# Patient Record
Sex: Female | Born: 1945 | Race: White | Hispanic: No | Marital: Married | Smoking: Never smoker
Health system: Southern US, Community
[De-identification: ages and names within clinical notes are randomized; demographics above are authoritative.]

## PROBLEM LIST (undated history)

## (undated) DIAGNOSIS — E079 Disorder of thyroid, unspecified: Secondary | ICD-10-CM

## (undated) DIAGNOSIS — M1990 Unspecified osteoarthritis, unspecified site: Secondary | ICD-10-CM

## (undated) DIAGNOSIS — K219 Gastro-esophageal reflux disease without esophagitis: Secondary | ICD-10-CM

## (undated) DIAGNOSIS — J45909 Unspecified asthma, uncomplicated: Secondary | ICD-10-CM

## (undated) DIAGNOSIS — R609 Edema, unspecified: Secondary | ICD-10-CM

## (undated) DIAGNOSIS — Z8719 Personal history of other diseases of the digestive system: Secondary | ICD-10-CM

## (undated) DIAGNOSIS — G629 Polyneuropathy, unspecified: Secondary | ICD-10-CM

## (undated) DIAGNOSIS — E039 Hypothyroidism, unspecified: Secondary | ICD-10-CM

## (undated) DIAGNOSIS — F40240 Claustrophobia: Secondary | ICD-10-CM

## (undated) DIAGNOSIS — T7840XA Allergy, unspecified, initial encounter: Secondary | ICD-10-CM

## (undated) DIAGNOSIS — B009 Herpesviral infection, unspecified: Secondary | ICD-10-CM

## (undated) HISTORY — DX: Unspecified osteoarthritis, unspecified site: M19.90

## (undated) HISTORY — DX: Allergy, unspecified, initial encounter: T78.40XA

## (undated) HISTORY — PX: BACK SURGERY: SHX140

## (undated) HISTORY — DX: Gastro-esophageal reflux disease without esophagitis: K21.9

## (undated) HISTORY — DX: Herpesviral infection, unspecified: B00.9

## (undated) HISTORY — PX: CHOLECYSTECTOMY: SHX55

## (undated) HISTORY — DX: Disorder of thyroid, unspecified: E07.9

## (undated) HISTORY — DX: Unspecified asthma, uncomplicated: J45.909

## (undated) HISTORY — DX: Polyneuropathy, unspecified: G62.9

---

## 1970-03-17 HISTORY — PX: BREAST BIOPSY: SHX20

## 1979-03-18 HISTORY — PX: LUMBAR LAMINECTOMY: SHX95

## 1997-03-17 HISTORY — PX: LUMBAR LAMINECTOMY: SHX95

## 1998-02-27 ENCOUNTER — Other Ambulatory Visit: Admission: RE | Admit: 1998-02-27 | Discharge: 1998-02-27 | Payer: Self-pay | Admitting: Obstetrics and Gynecology

## 2006-01-06 ENCOUNTER — Ambulatory Visit: Payer: Self-pay | Admitting: Internal Medicine

## 2006-01-27 ENCOUNTER — Ambulatory Visit: Payer: Self-pay | Admitting: Internal Medicine

## 2006-10-06 ENCOUNTER — Encounter: Admission: RE | Admit: 2006-10-06 | Discharge: 2006-10-06 | Payer: Self-pay | Admitting: Internal Medicine

## 2006-10-11 ENCOUNTER — Encounter: Admission: RE | Admit: 2006-10-11 | Discharge: 2006-10-11 | Payer: Self-pay | Admitting: Internal Medicine

## 2008-02-14 ENCOUNTER — Ambulatory Visit: Payer: Self-pay | Admitting: Internal Medicine

## 2008-07-11 ENCOUNTER — Ambulatory Visit: Payer: Self-pay | Admitting: Internal Medicine

## 2008-08-07 ENCOUNTER — Ambulatory Visit: Payer: Self-pay | Admitting: Internal Medicine

## 2008-09-03 IMAGING — CR DG PELVIS 1-2V
1 series · 1 of 1 positions shown · non-contrast
Comparison: none

CLINICAL DATA: Right hip and groin pain

Pelvis one view:
There is no previous for comparison. Degenerative changes in the visualized
lower lumbar spine. Mild narrowing of the articular cartilage in the hips, right
greater than left. Negative for fracture, dislocation, or other acute bone
abnormality.

[view not recorded]
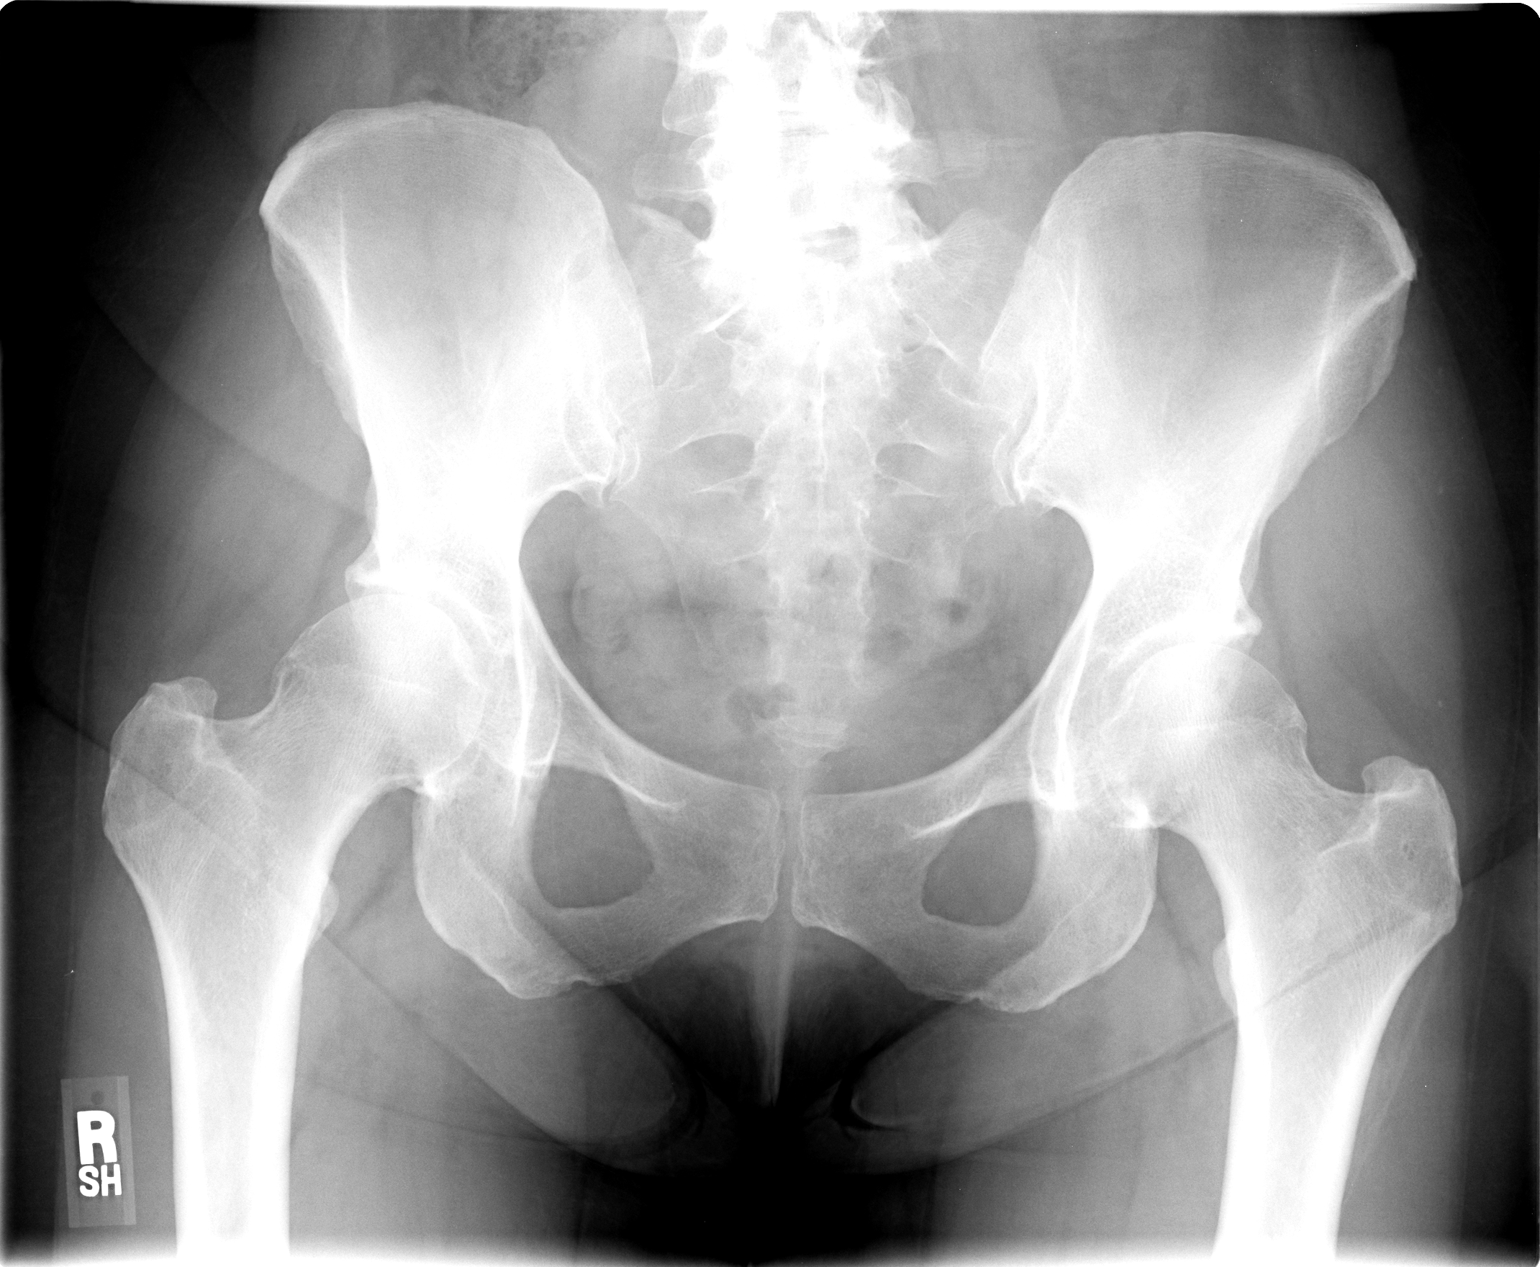

[1 of 1 positions shown; findings below may reference images not displayed]

IMPRESSION: 1. Early degenerative changes in bilateral hips and lumbar spine.

Right hip 2 view:

Small marginal spurs from the femoral head. Negative for fracture, dislocation,
or other acute bone injury. Lumbar facet degenerative changes again noted.
IMPRESSION: 1. Negative for fracture, dislocation, or other acute bone injury.
2. Mild degenerative changes in the right hip

## 2009-02-22 ENCOUNTER — Ambulatory Visit: Payer: Self-pay | Admitting: Internal Medicine

## 2009-04-09 ENCOUNTER — Ambulatory Visit: Payer: Self-pay | Admitting: Internal Medicine

## 2009-04-10 ENCOUNTER — Encounter: Admission: RE | Admit: 2009-04-10 | Discharge: 2009-04-10 | Payer: Self-pay | Admitting: Neurosurgery

## 2009-11-30 ENCOUNTER — Ambulatory Visit: Payer: Self-pay | Admitting: Internal Medicine

## 2010-05-21 ENCOUNTER — Other Ambulatory Visit: Payer: Self-pay | Admitting: Internal Medicine

## 2010-05-23 ENCOUNTER — Encounter: Payer: Self-pay | Admitting: Internal Medicine

## 2010-06-18 ENCOUNTER — Encounter (INDEPENDENT_AMBULATORY_CARE_PROVIDER_SITE_OTHER): Payer: PRIVATE HEALTH INSURANCE | Admitting: Internal Medicine

## 2010-06-18 DIAGNOSIS — Z23 Encounter for immunization: Secondary | ICD-10-CM

## 2010-06-18 DIAGNOSIS — E039 Hypothyroidism, unspecified: Secondary | ICD-10-CM

## 2010-08-15 ENCOUNTER — Telehealth: Payer: Self-pay | Admitting: Internal Medicine

## 2010-08-15 NOTE — Telephone Encounter (Signed)
Please doorder for [name] to have bone density study at same time as mammogram

## 2010-08-29 ENCOUNTER — Encounter: Payer: Self-pay | Admitting: Internal Medicine

## 2010-10-18 ENCOUNTER — Other Ambulatory Visit: Payer: Self-pay

## 2010-10-18 MED ORDER — LEVOTHYROXINE SODIUM 125 MCG PO TABS: 125.0000 ug | ORAL_TABLET | Freq: Every day | ORAL | Status: DC

## 2010-11-01 ENCOUNTER — Encounter: Payer: Self-pay | Admitting: Internal Medicine

## 2010-12-18 ENCOUNTER — Encounter: Payer: Self-pay | Admitting: Internal Medicine

## 2010-12-19 ENCOUNTER — Ambulatory Visit: Payer: PRIVATE HEALTH INSURANCE | Admitting: Internal Medicine

## 2011-01-02 ENCOUNTER — Ambulatory Visit: Payer: PRIVATE HEALTH INSURANCE | Admitting: Internal Medicine

## 2011-01-07 ENCOUNTER — Ambulatory Visit (INDEPENDENT_AMBULATORY_CARE_PROVIDER_SITE_OTHER): Payer: PRIVATE HEALTH INSURANCE | Admitting: Internal Medicine

## 2011-01-07 ENCOUNTER — Encounter: Payer: Self-pay | Admitting: Internal Medicine

## 2011-01-07 VITALS — BP 128/74 | HR 68 | Temp 98.3°F | Ht 64.5 in | Wt 153.5 lb

## 2011-01-07 DIAGNOSIS — F411 Generalized anxiety disorder: Secondary | ICD-10-CM

## 2011-01-07 DIAGNOSIS — J309 Allergic rhinitis, unspecified: Secondary | ICD-10-CM | POA: Insufficient documentation

## 2011-01-07 DIAGNOSIS — F419 Anxiety disorder, unspecified: Secondary | ICD-10-CM | POA: Insufficient documentation

## 2011-01-07 DIAGNOSIS — B009 Herpesviral infection, unspecified: Secondary | ICD-10-CM

## 2011-01-07 DIAGNOSIS — E039 Hypothyroidism, unspecified: Secondary | ICD-10-CM

## 2011-01-07 NOTE — Patient Instructions (Signed)
Thyroid-stimulating hormone drawn today and results are pending. For now continue same dose of Synthroid. Please return in 6 months for physical examination and fasting lab work. You may defer bone density study until next year. Please gets annual mammogram.

## 2011-01-07 NOTE — Progress Notes (Signed)
  Subjective:    Patient ID: [name], female    DOB: 1945/04/02, 65 y.o.   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 Patient continues to have issues with right hip osteoarthritis. Had bone density June 2010 which was normal. Last mammogram [facility] August 2011. Is on Synthroid 0.125 mg daily. Takes Osteo Bi-Flex for osteoarthritis of hip. Takes Claritin. Takes Valtrex 1000 mg daily to prevent outbreak of HSV type II.  Past medical history includes benign left breast biopsy 1972, lumbar disc surgery L4 1999, lumbar disc surgery at L5 1981, cholecystectomy Aug 16, 2002. Had Pneumovax immunization April 2012. Just recently had influenza immunization through her husband's employment. Had tetanus immunization Aug 16, 2003. She is allergic to penicillin. Gets GYN care through [facility]. Had colonoscopy by Dr. Juanda Chance November 2007 with 10 year followup recommended. Bone density study June 2010 normal. Spirometry done by allergist 2001/08/15 normal. Allergy skin test positive for molds in Aug 15, 2001.  Family history father died at age 67 of an MI. One brother died at 58 years of age of congestive heart failure from rheumatic fever. 2 sisters with hypertension. One brother in good health. 2 adult daughters in good health. Mother in her 48s with heart and neurological problems.  Patient does not smoke. Social alcohol consumption.    Review of Systems     Objective:   Physical Exam neck is supple without adenopathy or thyromegaly. Chest is clear to auscultation. Cardiac exam regular rate and rhythm normal S1 and S2. Extremities without edema.        Assessment & Plan:  Hypothyroidism-TSH drawn today and is pending  Allergic rhinitis-stable  HSV type II-stable on Valtrex prevention therapy per GYN  Plan: Return in 6 months for physical exam without GYN exam  and fasting lab work

## 2011-04-13 ENCOUNTER — Ambulatory Visit (INDEPENDENT_AMBULATORY_CARE_PROVIDER_SITE_OTHER): Payer: PRIVATE HEALTH INSURANCE | Admitting: Internal Medicine

## 2011-04-13 VITALS — HR 80 | Temp 97.3°F | Resp 12

## 2011-04-13 DIAGNOSIS — J029 Acute pharyngitis, unspecified: Secondary | ICD-10-CM

## 2011-04-14 ENCOUNTER — Encounter: Payer: Self-pay | Admitting: Internal Medicine

## 2011-04-14 LAB — POCT RAPID STREP A (OFFICE): Rapid Strep A Screen: NEGATIVE

## 2011-04-14 NOTE — Patient Instructions (Signed)
Take Zithromax Z-Pak 2 tablets today followed by 1 tablet days 2 through 5. Take Hycodan 1 teaspoon every 6 hours as needed for cough

## 2011-04-14 NOTE — Progress Notes (Signed)
  Subjective:    Patient ID: [name], female    DOB: 12-Nov-1945, 66 y.o.   MRN: [id number]  HPI  husband called this morning saying patient had been ill for a couple of days. Has congested cough, fever, headache and flulike illness. Complaining of right ear pain. Suggested he bring her to the office for an emergency after hours visit. No shaking chills or diffuse myalgias.    Review of Systems     Objective:   Physical Exam right TM is full but not red, left TM clear, pharynx slightly injected without exudate. Rapid strep screen is negative. Neck supple. Chest clear.        Assessment & Plan:  Right serous otitis media  URI  Plan: Zithromax Z-Pak take 2 tablets by mouth today followed by 1 tablet by mouth days 2 through 5 with 1 refill. Hycodan 8 ounces 1 teaspoon by mouth every 6 hours when necessary cough.

## 2011-07-18 ENCOUNTER — Other Ambulatory Visit: Payer: PRIVATE HEALTH INSURANCE | Admitting: Internal Medicine

## 2011-07-18 DIAGNOSIS — Z0000 Encounter for general adult medical examination without abnormal findings: Secondary | ICD-10-CM

## 2011-07-18 DIAGNOSIS — E039 Hypothyroidism, unspecified: Secondary | ICD-10-CM

## 2011-07-18 LAB — CBC WITH DIFFERENTIAL/PLATELET
Basophils Absolute: 0.1 10*3/uL (ref 0.0–0.1)
Basophils Relative: 1 % (ref 0–1)
Hemoglobin: 14.1 g/dL (ref 12.0–15.0)
Lymphs Abs: 1.3 10*3/uL (ref 0.7–4.0)
MCV: 93.9 fL (ref 78.0–100.0)
Monocytes Absolute: 0.4 10*3/uL (ref 0.1–1.0)
Monocytes Relative: 8 % (ref 3–12)
Neutro Abs: 2.6 10*3/uL (ref 1.7–7.7)
Platelets: 246 10*3/uL (ref 150–400)
RBC: 4.62 MIL/uL (ref 3.87–5.11)

## 2011-07-18 LAB — LIPID PANEL
HDL: 66 mg/dL (ref >39)
Total CHOL/HDL Ratio: 2.8 Ratio
VLDL: 11 mg/dL (ref 0–40)

## 2011-07-18 LAB — COMPREHENSIVE METABOLIC PANEL
AST: 17 U/L (ref 0–37)
Albumin: 4.2 g/dL (ref 3.5–5.2)
Alkaline Phosphatase: 65 U/L (ref 39–117)
BUN: 17 mg/dL (ref 6–23)
CO2: 29 mEq/L (ref 19–32)
Chloride: 106 mEq/L (ref 96–112)
Creat: 0.65 mg/dL (ref 0.50–1.10)
Glucose, Bld: 75 mg/dL (ref 70–99)

## 2011-07-21 ENCOUNTER — Encounter: Payer: Self-pay | Admitting: Internal Medicine

## 2011-07-21 ENCOUNTER — Ambulatory Visit (INDEPENDENT_AMBULATORY_CARE_PROVIDER_SITE_OTHER): Payer: PRIVATE HEALTH INSURANCE | Admitting: Internal Medicine

## 2011-07-21 VITALS — BP 112/72 | HR 68 | Temp 98.0°F | Ht 63.25 in | Wt 152.0 lb

## 2011-07-21 DIAGNOSIS — M1990 Unspecified osteoarthritis, unspecified site: Secondary | ICD-10-CM

## 2011-07-21 DIAGNOSIS — Z0000 Encounter for general adult medical examination without abnormal findings: Secondary | ICD-10-CM

## 2011-07-21 LAB — POCT URINALYSIS DIPSTICK
Ketones, UA: NEGATIVE
Leukocytes, UA: NEGATIVE
Protein, UA: NEGATIVE
Spec Grav, UA: 1.02
Urobilinogen, UA: NEGATIVE
pH, UA: 5

## 2011-09-15 ENCOUNTER — Encounter: Payer: Self-pay | Admitting: Internal Medicine

## 2011-09-15 NOTE — Progress Notes (Signed)
Subjective:    Patient ID: [name], female    DOB: October 11, 1945, 66 y.o.   MRN: [id number]  HPI 66 year old white female in today for health maintenance examination and evaluation of medical problems. History of anxiety and hypothyroidism. History of allergic rhinitis. History of herpes simplex type II. History of right foot peripheral neuropathy. History of GE reflux. History of right hip osteoarthritis. Gets annual mammogram. She is allergic to penicillin it causes anaphylaxis.  Past medical history: Left breast biopsy which was benign 1972. Lumbar disc surgery at L4 1999, lumbar disc surgery at L5 1981. Cholecystectomy 2004. Had Pneumovax immunization April 2012. Tetanus immunization 2005. Dr. Lina Sar did colonoscopy November 2007 and recommended Q. 10 year followup. GYN exam is done by [facility] in [facility]  She has a health care power of attorney which I have a copy of in my chart.  Family history: Father died at age 39 of an MI mother with history of neurological problems and heart disease. One brother died at age 8 of congestive heart failure secondary to rheumatic fever complications. One brother in good health. One sister with hypertension and obesity. Another sister with history of hypertension and obesity. 2 adult daughters. Patient does not smoke. Social alcohol consumption. 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 She is married to [employer] who is Warehouse manager of [employer] in [place].  Subjective:   Patient presents for Medicare Annual/Subsequent preventive examination.   Review Past Medical/Family/Social: See above   Risk Factors  Current exercise habits: Walks frequently Dietary issues discussed: Low-fat diet   Cardiac risk factors:  Depression Screen  (Note: if answer to either of the following is "Yes", a more complete depression screening is indicated)   Over the past two weeks, have you felt down, depressed or hopeless? No    Over the past two weeks, have you felt little interest or pleasure in doing things? No Have you lost interest or pleasure in daily life? No Do you often feel hopeless? No Do you cry easily over simple problems? No   Activities of Daily Living  In your present state of health, do you have any difficulty performing the following activities?:   Driving? No  Managing money? No  Feeding yourself? No  Getting from bed to chair? No  Climbing a flight of stairs? No  Preparing food and eating?: No  Bathing or showering? No  Getting dressed: No  Getting to the toilet? No  Using the toilet:No  Moving around from place to place: No  In the past year have you fallen or had a near fall?:No  Are you sexually active? No  Do you have more than one partner? No   Hearing Difficulties: No  Do you often ask people to speak up or repeat themselves? No  Do you experience ringing or noises in your ears? No  Do you have difficulty understanding soft or whispered voices? No  Do you feel that you have a problem with memory? No Do you often misplace items? No    Home Safety:  Do you have a smoke alarm at your residence? Yes Do you have grab bars in the bathroom?no Do you have throw rugs in your house?yes   Cognitive Testing  Alert? Yes Normal Appearance?Yes  Oriented to person? Yes Place? Yes  Time? Yes  Recall of three objects? Yes  Can perform simple calculations? Yes  Displays appropriate judgment?Yes  Can read the correct time  from a watch face?Yes   List the Names of Other Physician/Practitioners you currently use:  See referral list for the physicians patient is currently seeing.  [facility] in [place]   Review of Systems:   Objective:     General appearance: Appears stated age and mildly obese  Head: Normocephalic, without obvious abnormality, atraumatic  Eyes: conj clear, EOMi PEERLA  Ears: normal TM's and external ear canals both ears  Nose: Nares normal. Septum  midline. Mucosa normal. No drainage or sinus tenderness.  Throat: lips, mucosa, and tongue normal; teeth and gums normal  Neck: no adenopathy, no carotid bruit, no JVD, supple, symmetrical, trachea midline and thyroid not enlarged, symmetric, no tenderness/mass/nodules  No CVA tenderness.  Lungs: clear to auscultation bilaterally  Breasts: normal appearance, no masses or tenderness, top of the pacemaker on left upper chest. Incision well-healed. It is tender.  Heart: regular rate and rhythm, S1, S2 normal, no murmur, click, rub or gallop  Abdomen: soft, non-tender; bowel sounds normal; no masses, no organomegaly  Musculoskeletal: ROM normal in all joints, no crepitus, no deformity, Normal muscle strengthen. Back  is symmetric, no curvature. Skin: Skin color, texture, turgor normal. No rashes or lesions  Lymph nodes: Cervical, supraclavicular, and axillary nodes normal.  Neurologic: CN 2 -12 Normal, Normal symmetric reflexes. Normal coordination and gait  Psych: Alert & Oriented x 3, Mood appear stable.    Assessment:    Annual wellness medicare exam   Plan:    During the course of the visit the patient was educated and counseled about appropriate screening and preventive services including: Annual mammogram, colonoscopy due 2017, Zostavax vaccine, tetanus immunization and Pneumovax immunizations are up-to-date. Recommend bone density study. Last one was in 2010.       Patient Instructions (the written plan) was given to the patient.  Medicare Attestation  I have personally reviewed:  The patient's medical and social history  Their use of alcohol, tobacco or illicit drugs  Their current medications and supplements  The patient's functional ability including ADLs,fall risks, home safety risks, cognitive, and hearing and visual impairment  Diet and physical activities  Evidence for depression or mood disorders  The patient's weight, height, BMI, and visual acuity have been recorded  in the chart. I have made referrals, counseling, and provided education to the patient based on review of the above and I have provided the patient with a written personalized care plan for preventive services.       Review of Systems  Constitutional: Negative.   HENT: Negative.   Eyes: Negative.   Respiratory: Negative.   Cardiovascular: Negative.   Gastrointestinal: Negative.   Genitourinary: Negative.   Musculoskeletal: Negative.   Neurological: Negative.   Hematological: Negative.   Psychiatric/Behavioral: Negative.        Objective:   Physical Exam  Vitals reviewed. Constitutional: She is oriented to person, place, and time. She appears well-developed and well-nourished. No distress.  HENT:  Head: Normocephalic and atraumatic.  Right Ear: External ear normal.  Left Ear: External ear normal.  Mouth/Throat: Oropharynx is clear and moist.  Eyes: Conjunctivae and EOM are normal. Pupils are equal, round, and reactive to light. Right eye exhibits no discharge. Left eye exhibits no discharge. No scleral icterus.  Neck: Neck supple. No JVD present. No thyromegaly present.  Cardiovascular: Normal rate, regular rhythm, normal heart sounds and intact distal pulses.   No murmur heard. Pulmonary/Chest: Effort normal and breath sounds normal. She has no wheezes. She has  no rales.       Breasts normal female  Abdominal: Soft. Bowel sounds are normal. She exhibits no distension and no mass. There is no tenderness. There is no rebound and no guarding.  Genitourinary:       Deferred to GYN  Musculoskeletal: Normal range of motion. She exhibits no edema.  Lymphadenopathy:    She has no cervical adenopathy.  Neurological: She is alert and oriented to person, place, and time. She has normal reflexes. No cranial nerve deficit. Coordination normal.  Skin: Skin is warm and dry. No rash noted. She is not diaphoretic.  Psychiatric: She has a normal mood and affect. Her behavior is normal.  Judgment and thought content normal.          Assessment & Plan:  Hypothyroidism-stable on current regimen. TSH tends to run a bit low but she feels well on current dose of Synthroid  Allergic rhinitis  History of HSV type II  Right hip osteoarthritis-stable  History of GE reflux  Allergic rhinitis  Right foot peripheral neuropathy  Plan: Return in 6 months for office visit and TSH. Consider bone density study if not done through GYN. Recommend annual mammogram. Prescription for Zostavax vaccine given.

## 2011-09-23 ENCOUNTER — Encounter: Payer: Self-pay | Admitting: Internal Medicine

## 2011-09-23 ENCOUNTER — Ambulatory Visit (INDEPENDENT_AMBULATORY_CARE_PROVIDER_SITE_OTHER): Payer: PRIVATE HEALTH INSURANCE | Admitting: Internal Medicine

## 2011-09-23 VITALS — BP 122/82 | HR 76 | Temp 97.5°F | Ht 64.0 in | Wt 156.0 lb

## 2011-09-23 DIAGNOSIS — K219 Gastro-esophageal reflux disease without esophagitis: Secondary | ICD-10-CM | POA: Insufficient documentation

## 2011-09-23 DIAGNOSIS — J040 Acute laryngitis: Secondary | ICD-10-CM

## 2011-09-23 DIAGNOSIS — J309 Allergic rhinitis, unspecified: Secondary | ICD-10-CM

## 2011-09-23 NOTE — Patient Instructions (Addendum)
Take Prilosec 20 mg daily for 30 days. Take 12 day Sterapred DS DS 10 mg dosepak in tapering course. Call if not better in 2 weeks.

## 2011-09-23 NOTE — Progress Notes (Signed)
  Subjective:    Patient ID: [name], female    DOB: 09-30-1945, 66 y.o.   MRN: [id number]  HPI 66 year old white female who has recently been having a lot of construction done in her home over the past 4 months. There has been a lot of dust in the house and she has clean the great deal but has been wearing a mask. History of allergic rhinitis. She's been hoarse for 4 weeks and her husband has become concerned. She also has a history of GE reflux and takes when necessary over-the-counter acid blockers. No increase in GE reflux but remembers that GE reflux symptoms were basically silent previously. No discolored sputum production. No discolored nasal drainage. No fever. She's a bit tired from all the cleaning. She did have a significant hordeolum right eye which ophthalmologist in  [facility] treated with a 14 day course of Cipro recently. That has resolved.    Review of Systems     Objective:   Physical Exam conjunctivae are clear bilaterally. Pharynx is slightly injected. She sounds hoarse when she speaks. She has very boggy nasal mucosa. TMs are clear. Neck is supple without adenopathy or thyromegaly. Chest is clear.        Assessment & Plan:  Allergic rhinitis  Laryngitis  GE reflux  Plan: Sterapred DS 10 mg 12 day dosepak. Prilosec 20 mg daily for 30 days. If symptoms do not improve, refer to allergist.

## 2011-10-03 ENCOUNTER — Telehealth: Payer: Self-pay | Admitting: Internal Medicine

## 2011-11-19 ENCOUNTER — Other Ambulatory Visit: Payer: Self-pay

## 2011-11-19 MED ORDER — ALPRAZOLAM 0.5 MG PO TABS: 0.5000 mg | ORAL_TABLET | Freq: Every evening | ORAL | Status: DC | PRN

## 2012-01-19 ENCOUNTER — Other Ambulatory Visit: Payer: PRIVATE HEALTH INSURANCE | Admitting: Internal Medicine

## 2012-01-19 DIAGNOSIS — E039 Hypothyroidism, unspecified: Secondary | ICD-10-CM

## 2012-01-20 ENCOUNTER — Ambulatory Visit (INDEPENDENT_AMBULATORY_CARE_PROVIDER_SITE_OTHER): Payer: PRIVATE HEALTH INSURANCE | Admitting: Internal Medicine

## 2012-01-20 ENCOUNTER — Encounter: Payer: Self-pay | Admitting: Internal Medicine

## 2012-01-20 VITALS — BP 126/70 | Temp 97.6°F | Wt 159.0 lb

## 2012-01-20 DIAGNOSIS — E039 Hypothyroidism, unspecified: Secondary | ICD-10-CM

## 2012-01-20 DIAGNOSIS — M7060 Trochanteric bursitis, unspecified hip: Secondary | ICD-10-CM

## 2012-01-20 DIAGNOSIS — M76899 Other specified enthesopathies of unspecified lower limb, excluding foot: Secondary | ICD-10-CM

## 2012-01-20 NOTE — Progress Notes (Signed)
  Subjective:    Patient ID: [name], female    DOB: 06/24/1945, 66 y.o.   MRN: [id number]   HPI 66 year old white female in today for recheck of hypothyroidism. For the past several visits, TSH is been less than 1 on Synthroid 0.125 mg daily. At this point I would like to decrease Synthroid to 0.112 mg daily. She is agreeable to this. She will return in 3 months for TSH without office visit.  Also has a new problem today, she has right hip pain. She has seen Dr. Despina Hick in the past and has been diagnosed with osteoarthritis of the right hip. However the summer she was playing with grandchildren and a dog caused her to fall to the ground landing on her right hip. A month later it began to hurt in his been aching continuously.    Review of Systems     Objective:   Physical Exam neck is supple without thyromegaly; point tenderness over right greater trochanter. Pain with inversion and eversion of right he up but main area of tenderness is right greater trochanter. Skin is warm and dry. Chest clear. Cardiac exam regular rate and rhythm. Affect is normal        Assessment & Plan:  Trochanteric bursitis-try Celebrex 200 mg daily for 30 days. If does not improve, can be seen by orthopedist for injection-samples provided  Hypothyroidism-stable with when necessary Xanax-decreased to Synthroid 0.112 mg daily and recheck TSH only in 3 months  History of anxiety-stable with Xanax  Plan: Return in 3 months for TSH otherwise CPE in 6 months. Influenza immunization has been given a previous visit

## 2012-03-04 ENCOUNTER — Telehealth: Payer: Self-pay | Admitting: Internal Medicine

## 2012-03-04 MED ORDER — HYDROCODONE-ACETAMINOPHEN 5-325 MG PO TABS: 1.0000 | ORAL_TABLET | Freq: Four times a day (QID) | ORAL | Status: DC | PRN

## 2012-03-04 MED ORDER — CELECOXIB 200 MG PO CAPS: 200.0000 mg | ORAL_CAPSULE | Freq: Every day | ORAL | Status: DC

## 2012-03-04 NOTE — Telephone Encounter (Signed)
Patient is also requesting a pain pill for when she awakens in the middle of the night with hip pain. Ok per Dr. Lenord Fellers for [facility] 5/325, as directed, #60, along with Celebrex sent to [facility].

## 2012-03-04 NOTE — Telephone Encounter (Signed)
Pt called about having right hip pain. Pt stated that the celebrex Dr. Lenord Fellers gave her helped. Pt stated that she will be seeing Aluisio at the beginning of the year in January but wanted something to help with the pain throughout the holidays. Pt has been taking Advil and Aleve after the celebrex was gone and it has helped sometimes but not all the time.

## 2012-03-04 NOTE — Telephone Encounter (Signed)
Stay with Celebrex 200 mg daily. May have to pay out of pocket because of prescription coverage.

## 2012-04-07 ENCOUNTER — Telehealth: Payer: Self-pay

## 2012-04-07 MED ORDER — MELOXICAM 15 MG PO TABS: 15.0000 mg | ORAL_TABLET | Freq: Every day | ORAL | Status: DC

## 2012-04-07 NOTE — Telephone Encounter (Signed)
Patient requesting a rx for Mobic. Tried one of her daughter's and it gave her more relief than Celebrex. Sees Dr. Despina Hick early February. Ok per Dr. Lenord Fellers for this Rx

## 2012-04-23 ENCOUNTER — Other Ambulatory Visit: Payer: PRIVATE HEALTH INSURANCE | Admitting: Internal Medicine

## 2012-04-23 DIAGNOSIS — E039 Hypothyroidism, unspecified: Secondary | ICD-10-CM

## 2012-04-28 NOTE — Progress Notes (Signed)
Patient informed. 

## 2012-05-24 ENCOUNTER — Encounter: Payer: Self-pay | Admitting: Internal Medicine

## 2012-05-24 ENCOUNTER — Ambulatory Visit (INDEPENDENT_AMBULATORY_CARE_PROVIDER_SITE_OTHER): Payer: PRIVATE HEALTH INSURANCE | Admitting: Internal Medicine

## 2012-05-24 VITALS — BP 112/76 | HR 88 | Temp 99.3°F | Wt 149.5 lb

## 2012-05-24 DIAGNOSIS — R110 Nausea: Secondary | ICD-10-CM

## 2012-05-24 DIAGNOSIS — M169 Osteoarthritis of hip, unspecified: Secondary | ICD-10-CM

## 2012-05-24 DIAGNOSIS — R52 Pain, unspecified: Secondary | ICD-10-CM

## 2012-05-24 DIAGNOSIS — M1611 Unilateral primary osteoarthritis, right hip: Secondary | ICD-10-CM | POA: Insufficient documentation

## 2012-05-24 DIAGNOSIS — M1610 Unilateral primary osteoarthritis, unspecified hip: Secondary | ICD-10-CM

## 2012-05-24 LAB — POCT URINALYSIS DIPSTICK
Bilirubin, UA: NEGATIVE
Blood, UA: NEGATIVE
Glucose, UA: NEGATIVE
Spec Grav, UA: 1.025
pH, UA: 6

## 2012-05-24 NOTE — Patient Instructions (Addendum)
Take Tamiflu as directed and Phenergan as needed for nausea. Stay with clear liquids until vomiting and nausea have resolved.

## 2012-05-24 NOTE — Progress Notes (Signed)
  Subjective:    Patient ID: [name], female    DOB: 1945/06/13, 67 y.o.   MRN: [id number]  HPI Patient went with her husband this past weekend in [place]. Yesterday came down with fever chills myalgias and nausea. Did take influenza immunization this past fall. Husband is not sick yet but apparently other folks at the event and it have come down with similar symptoms. Patient denies diarrhea.  Urinalysis is normal. CBC with differential is pending.  Also, has been told by Dr. Despina Hick she needs to have hip replacement. Says Celebrex and Mobic caused diarrhea. Recently tramadol has been prescribed by Dr. Despina Hick and seems to help a bit.    Review of Systems     Objective:   Physical Exam  HENT:  Head: Normocephalic and atraumatic.  Right Ear: External ear normal.  Left Ear: External ear normal.  Mouth/Throat: Oropharynx is clear and moist. No oropharyngeal exudate.  Eyes: Conjunctivae and EOM are normal. Right eye exhibits no discharge. Left eye exhibits no discharge. No scleral icterus.  Pulmonary/Chest: Effort normal and breath sounds normal. No respiratory distress. She has no wheezes. She has no rales.  Lymphadenopathy:    She has no cervical adenopathy.  Skin: Skin is warm and dry.          Assessment & Plan:  Viral syndrome  Plan: Tamiflu 75 mg twice daily for 5 days. Phenergan 25 mg tablets #30 one by mouth every 4 hours when necessary nausea. Clear liquids until nausea has resolved.

## 2012-05-25 ENCOUNTER — Telehealth: Payer: Self-pay | Admitting: Internal Medicine

## 2012-05-25 LAB — CBC WITH DIFFERENTIAL/PLATELET
Basophils Absolute: 0 10*3/uL (ref 0.0–0.1)
Basophils Relative: 0 % (ref 0–1)
Eosinophils Absolute: 0.1 10*3/uL (ref 0.0–0.7)
Eosinophils Relative: 1 % (ref 0–5)
HCT: 41 % (ref 36.0–46.0)
MCHC: 35.9 g/dL (ref 30.0–36.0)
MCV: 86.5 fL (ref 78.0–100.0)
Neutro Abs: 6 10*3/uL (ref 1.7–7.7)
Platelets: 415 10*3/uL — ABNORMAL HIGH (ref 150–400)
RDW: 13.5 % (ref 11.5–15.5)

## 2012-05-25 MED ORDER — ONDANSETRON HCL 8 MG PO TABS: 8.0000 mg | ORAL_TABLET | Freq: Three times a day (TID) | ORAL | Status: DC | PRN

## 2012-05-25 NOTE — Telephone Encounter (Signed)
Spoke with patients husband re : nausea and vomiting. He thinks trying a different medicine rather than the same one in suppository form would be better. Zofran 8mg  tabs sent to [facility]

## 2012-05-25 NOTE — Telephone Encounter (Signed)
Spoke with patients

## 2012-05-25 NOTE — Telephone Encounter (Signed)
Call in either Phernergan suppositories or Zofran tablets. Which does she prefer?

## 2012-06-08 ENCOUNTER — Other Ambulatory Visit (HOSPITAL_COMMUNITY): Payer: Self-pay | Admitting: Orthopaedic Surgery

## 2012-06-11 ENCOUNTER — Telehealth: Payer: Self-pay | Admitting: Internal Medicine

## 2012-06-13 NOTE — Telephone Encounter (Signed)
Do not think a check up is needed prior to surgery

## 2012-06-14 NOTE — Telephone Encounter (Signed)
LMOM for patient to contact me.  Will advise of information per Dr. Lenord Fellers when patient calls back.

## 2012-06-15 NOTE — Telephone Encounter (Signed)
Spoke with patient and advised Dr. Lenord Fellers does NOT feel she needs check up prior to surgery.  Patient is happy and verbalizes understanding.  Wished her well for her surgery.

## 2012-06-16 ENCOUNTER — Encounter (HOSPITAL_COMMUNITY): Payer: Self-pay | Admitting: Pharmacy Technician

## 2012-06-23 ENCOUNTER — Encounter (HOSPITAL_COMMUNITY)
Admission: RE | Admit: 2012-06-23 | Discharge: 2012-06-23 | Disposition: A | Discharge status: Home / Self Care | Payer: PRIVATE HEALTH INSURANCE | Source: Ambulatory Visit | Attending: Orthopaedic Surgery | Admitting: Orthopaedic Surgery

## 2012-06-23 ENCOUNTER — Encounter (HOSPITAL_COMMUNITY): Payer: Self-pay

## 2012-06-23 DIAGNOSIS — K219 Gastro-esophageal reflux disease without esophagitis: Secondary | ICD-10-CM | POA: Diagnosis present

## 2012-06-23 DIAGNOSIS — Z471 Aftercare following joint replacement surgery: Secondary | ICD-10-CM | POA: Diagnosis not present

## 2012-06-23 DIAGNOSIS — Z96649 Presence of unspecified artificial hip joint: Secondary | ICD-10-CM | POA: Diagnosis not present

## 2012-06-23 DIAGNOSIS — M25559 Pain in unspecified hip: Secondary | ICD-10-CM | POA: Diagnosis not present

## 2012-06-23 DIAGNOSIS — G5790 Unspecified mononeuropathy of unspecified lower limb: Secondary | ICD-10-CM | POA: Diagnosis present

## 2012-06-23 DIAGNOSIS — Z01812 Encounter for preprocedural laboratory examination: Secondary | ICD-10-CM | POA: Diagnosis not present

## 2012-06-23 DIAGNOSIS — M169 Osteoarthritis of hip, unspecified: Secondary | ICD-10-CM | POA: Diagnosis present

## 2012-06-23 DIAGNOSIS — D62 Acute posthemorrhagic anemia: Secondary | ICD-10-CM | POA: Diagnosis not present

## 2012-06-23 DIAGNOSIS — E039 Hypothyroidism, unspecified: Secondary | ICD-10-CM | POA: Diagnosis present

## 2012-06-23 HISTORY — DX: Hypothyroidism, unspecified: E03.9

## 2012-06-23 HISTORY — DX: Claustrophobia: F40.240

## 2012-06-23 HISTORY — DX: Personal history of other diseases of the digestive system: Z87.19

## 2012-06-23 LAB — ABO/RH: ABO/RH(D): B POS

## 2012-06-23 LAB — URINALYSIS, ROUTINE W REFLEX MICROSCOPIC
Glucose, UA: NEGATIVE mg/dL
Hgb urine dipstick: NEGATIVE
Protein, ur: NEGATIVE mg/dL
pH: 5 (ref 5.0–8.0)

## 2012-06-23 LAB — PROTIME-INR
INR: 0.93 (ref 0.00–1.49)
Prothrombin Time: 12.4 seconds (ref 11.6–15.2)

## 2012-06-23 LAB — SURGICAL PCR SCREEN: MRSA, PCR: NEGATIVE

## 2012-06-23 LAB — BASIC METABOLIC PANEL
BUN: 14 mg/dL (ref 6–23)
Chloride: 102 mEq/L (ref 96–112)
GFR calc Af Amer: >90 mL/min (ref >90)
Glucose, Bld: 79 mg/dL (ref 70–99)
Potassium: 4.3 mEq/L (ref 3.5–5.1)
Sodium: 140 mEq/L (ref 135–145)

## 2012-06-23 LAB — URINE MICROSCOPIC-ADD ON

## 2012-06-23 LAB — CBC
HCT: 43.1 % (ref 36.0–46.0)
Hemoglobin: 14.6 g/dL (ref 12.0–15.0)
MCHC: 33.9 g/dL (ref 30.0–36.0)
RBC: 4.81 MIL/uL (ref 3.87–5.11)

## 2012-06-23 NOTE — Patient Instructions (Signed)
YOUR SURGERY IS SCHEDULED AT [facility]  ON:   Friday  4/18  REPORT TO [facility] AT:  9:45 AM      PHONE # FOR SHORT STAY IS [phone]  DO NOT EAT OR DRINK ANYTHING AFTER MIDNIGHT THE NIGHT BEFORE YOUR SURGERY.  YOU MAY BRUSH YOUR TEETH, RINSE OUT YOUR MOUTH--BUT NO WATER, NO FOOD, NO CHEWING GUM, NO MINTS, NO CANDIES, NO CHEWING TOBACCO.  PLEASE TAKE THE FOLLOWING MEDICATIONS THE AM OF YOUR SURGERY WITH A FEW SIPS OF WATER:   SYNTHROID, VALTREX IF NEEDED, HYDROCODONE / ACETAMINOPHEN IF NEEDED, ALPRAZOLAM IF NEEDED.    DO NOT BRING VALUABLES, MONEY, CREDIT CARDS.  DO NOT WEAR JEWELRY, MAKE-UP, NAIL POLISH AND NO METAL PINS OR CLIPS IN YOUR HAIR. CONTACT LENS, DENTURES / PARTIALS, GLASSES SHOULD NOT BE WORN TO SURGERY AND IN MOST CASES-HEARING AIDS WILL NEED TO BE REMOVED.  BRING YOUR GLASSES CASE, ANY EQUIPMENT NEEDED FOR YOUR CONTACT LENS. FOR PATIENTS ADMITTED TO THE HOSPITAL--CHECK OUT TIME THE DAY OF DISCHARGE IS 11:00 AM.  ALL INPATIENT ROOMS ARE PRIVATE - WITH BATHROOM, TELEPHONE, TELEVISION AND WIFI INTERNET.                              PLEASE READ OVER ANY  FACT SHEETS THAT YOU WERE GIVEN: MRSA INFORMATION, BLOOD TRANSFUSION INFORMATION, FAILURE TO FOLLOW THESE INSTRUCTIONS MAY RESULT IN THE CANCELLATION OF YOUR SURGERY.   PATIENT SIGNATURE_________________________________

## 2012-06-23 NOTE — Pre-Procedure Instructions (Signed)
CXR AND EKG NOT NEEDED PER ANESTHESIOLOGIST'S GUIDELINES. 

## 2012-07-02 ENCOUNTER — Inpatient Hospital Stay (HOSPITAL_COMMUNITY): Payer: PRIVATE HEALTH INSURANCE

## 2012-07-02 ENCOUNTER — Encounter (HOSPITAL_COMMUNITY)
Admission: RE | Disposition: A | Discharge status: Home Health | Payer: Self-pay | Source: Ambulatory Visit | Attending: Orthopaedic Surgery

## 2012-07-02 ENCOUNTER — Encounter (HOSPITAL_COMMUNITY): Payer: Self-pay

## 2012-07-02 ENCOUNTER — Encounter (HOSPITAL_COMMUNITY): Payer: Self-pay | Admitting: Anesthesiology

## 2012-07-02 ENCOUNTER — Inpatient Hospital Stay (HOSPITAL_COMMUNITY): Payer: PRIVATE HEALTH INSURANCE | Admitting: Anesthesiology

## 2012-07-02 ENCOUNTER — Inpatient Hospital Stay (HOSPITAL_COMMUNITY)
Admission: RE | Admit: 2012-07-02 | Discharge: 2012-07-05 | DRG: 470 | Disposition: A | Discharge status: Home Health | Payer: PRIVATE HEALTH INSURANCE | Source: Ambulatory Visit | Attending: Orthopaedic Surgery | Admitting: Orthopaedic Surgery

## 2012-07-02 DIAGNOSIS — Z01812 Encounter for preprocedural laboratory examination: Secondary | ICD-10-CM

## 2012-07-02 DIAGNOSIS — K219 Gastro-esophageal reflux disease without esophagitis: Secondary | ICD-10-CM | POA: Diagnosis present

## 2012-07-02 DIAGNOSIS — G5790 Unspecified mononeuropathy of unspecified lower limb: Secondary | ICD-10-CM | POA: Diagnosis present

## 2012-07-02 DIAGNOSIS — D62 Acute posthemorrhagic anemia: Secondary | ICD-10-CM | POA: Diagnosis not present

## 2012-07-02 DIAGNOSIS — M169 Osteoarthritis of hip, unspecified: Principal | ICD-10-CM | POA: Diagnosis present

## 2012-07-02 DIAGNOSIS — E039 Hypothyroidism, unspecified: Secondary | ICD-10-CM | POA: Diagnosis present

## 2012-07-02 DIAGNOSIS — M1610 Unilateral primary osteoarthritis, unspecified hip: Principal | ICD-10-CM | POA: Diagnosis present

## 2012-07-02 HISTORY — PX: TOTAL HIP ARTHROPLASTY: SHX124

## 2012-07-02 LAB — TYPE AND SCREEN: Antibody Screen: NEGATIVE

## 2012-07-02 SURGERY — ARTHROPLASTY, HIP, TOTAL, ANTERIOR APPROACH
Anesthesia: General | Site: Hip | Laterality: Right | Wound class: Clean

## 2012-07-02 MED ORDER — MEPERIDINE HCL 50 MG/ML IJ SOLN: 6.2500 mg | INTRAMUSCULAR | Status: DC | PRN

## 2012-07-02 MED ORDER — ROCURONIUM BROMIDE 100 MG/10ML IV SOLN
INTRAVENOUS | Status: DC | PRN
  Administered 2012-07-02: 50 mg via INTRAVENOUS

## 2012-07-02 MED ORDER — HYDROMORPHONE HCL PF 1 MG/ML IJ SOLN
INTRAMUSCULAR | Status: DC | PRN
  Administered 2012-07-02: 2 mg via INTRAVENOUS

## 2012-07-02 MED ORDER — HYDROMORPHONE HCL PF 1 MG/ML IJ SOLN
1.0000 mg | INTRAMUSCULAR | Status: DC | PRN
  Administered 2012-07-02 – 2012-07-03 (×2): 1 mg via INTRAVENOUS
  Filled 2012-07-02 (×2): qty 1

## 2012-07-02 MED ORDER — ASPIRIN EC 325 MG PO TBEC
325.0000 mg | DELAYED_RELEASE_TABLET | Freq: Two times a day (BID) | ORAL | Status: DC
  Administered 2012-07-03 – 2012-07-05 (×5): 325 mg via ORAL
  Filled 2012-07-02 (×8): qty 1

## 2012-07-02 MED ORDER — GLYCOPYRROLATE 0.2 MG/ML IJ SOLN
INTRAMUSCULAR | Status: DC | PRN
  Administered 2012-07-02: 0.4 mg via INTRAVENOUS

## 2012-07-02 MED ORDER — NEOSTIGMINE METHYLSULFATE 1 MG/ML IJ SOLN
INTRAMUSCULAR | Status: DC | PRN
  Administered 2012-07-02: 3 mg via INTRAVENOUS

## 2012-07-02 MED ORDER — METHOCARBAMOL 500 MG PO TABS
ORAL_TABLET | ORAL | Status: AC
  Filled 2012-07-02: qty 1

## 2012-07-02 MED ORDER — LABETALOL HCL 5 MG/ML IV SOLN
INTRAVENOUS | Status: DC | PRN
  Administered 2012-07-02: 5 mg via INTRAVENOUS

## 2012-07-02 MED ORDER — PROMETHAZINE HCL 25 MG/ML IJ SOLN: 6.2500 mg | INTRAMUSCULAR | Status: DC | PRN

## 2012-07-02 MED ORDER — ONDANSETRON HCL 4 MG PO TABS: 4.0000 mg | ORAL_TABLET | Freq: Four times a day (QID) | ORAL | Status: DC | PRN

## 2012-07-02 MED ORDER — ALUM & MAG HYDROXIDE-SIMETH 200-200-20 MG/5ML PO SUSP: 30.0000 mL | ORAL | Status: DC | PRN

## 2012-07-02 MED ORDER — ALPRAZOLAM 0.5 MG PO TABS: 0.5000 mg | ORAL_TABLET | Freq: Every evening | ORAL | Status: DC | PRN

## 2012-07-02 MED ORDER — LEVOTHYROXINE SODIUM 112 MCG PO TABS
112.0000 ug | ORAL_TABLET | Freq: Every day | ORAL | Status: DC
  Administered 2012-07-03 – 2012-07-05 (×3): 112 ug via ORAL

## 2012-07-02 MED ORDER — FENTANYL CITRATE 0.05 MG/ML IJ SOLN
INTRAMUSCULAR | Status: DC | PRN
  Administered 2012-07-02 (×2): 50 ug via INTRAVENOUS
  Administered 2012-07-02: 75 ug via INTRAVENOUS
  Administered 2012-07-02: 50 ug via INTRAVENOUS
  Administered 2012-07-02: 25 ug via INTRAVENOUS

## 2012-07-02 MED ORDER — METOCLOPRAMIDE HCL 5 MG/ML IJ SOLN
INTRAMUSCULAR | Status: DC | PRN
  Administered 2012-07-02: 10 mg via INTRAVENOUS

## 2012-07-02 MED ORDER — STERILE WATER FOR IRRIGATION IR SOLN
Status: DC | PRN
  Administered 2012-07-02: 3000 mL

## 2012-07-02 MED ORDER — MIDAZOLAM HCL 5 MG/5ML IJ SOLN
INTRAMUSCULAR | Status: DC | PRN
  Administered 2012-07-02: 2 mg via INTRAVENOUS

## 2012-07-02 MED ORDER — METHOCARBAMOL 100 MG/ML IJ SOLN: 500.0000 mg | Freq: Four times a day (QID) | INTRAVENOUS | Status: DC | PRN

## 2012-07-02 MED ORDER — ZOLPIDEM TARTRATE 5 MG PO TABS: 5.0000 mg | ORAL_TABLET | Freq: Every evening | ORAL | Status: DC | PRN

## 2012-07-02 MED ORDER — LACTATED RINGERS IV SOLN
INTRAVENOUS | Status: DC | PRN
  Administered 2012-07-02 (×3): via INTRAVENOUS

## 2012-07-02 MED ORDER — DIPHENHYDRAMINE HCL 12.5 MG/5ML PO ELIX: 12.5000 mg | ORAL_SOLUTION | ORAL | Status: DC | PRN

## 2012-07-02 MED ORDER — CLINDAMYCIN PHOSPHATE 600 MG/50ML IV SOLN
600.0000 mg | Freq: Four times a day (QID) | INTRAVENOUS | Status: AC
  Administered 2012-07-02 (×2): 600 mg via INTRAVENOUS
  Filled 2012-07-02 (×2): qty 50

## 2012-07-02 MED ORDER — FERROUS SULFATE 325 (65 FE) MG PO TABS
325.0000 mg | ORAL_TABLET | Freq: Three times a day (TID) | ORAL | Status: DC
  Administered 2012-07-03 – 2012-07-05 (×7): 325 mg via ORAL
  Filled 2012-07-02 (×11): qty 1

## 2012-07-02 MED ORDER — LACTATED RINGERS IV SOLN: INTRAVENOUS | Status: DC

## 2012-07-02 MED ORDER — ONDANSETRON HCL 4 MG/2ML IJ SOLN
INTRAMUSCULAR | Status: DC | PRN
  Administered 2012-07-02: 4 mg via INTRAVENOUS

## 2012-07-02 MED ORDER — DOCUSATE SODIUM 100 MG PO CAPS
100.0000 mg | ORAL_CAPSULE | Freq: Two times a day (BID) | ORAL | Status: DC
  Administered 2012-07-02 – 2012-07-05 (×6): 100 mg via ORAL
  Filled 2012-07-02 (×7): qty 1

## 2012-07-02 MED ORDER — ADULT MULTIVITAMIN W/MINERALS CH
1.0000 | ORAL_TABLET | Freq: Every day | ORAL | Status: DC
  Administered 2012-07-03 – 2012-07-05 (×3): 1 via ORAL
  Filled 2012-07-02 (×4): qty 1

## 2012-07-02 MED ORDER — SODIUM CHLORIDE 0.9 % IR SOLN
Status: DC | PRN
  Administered 2012-07-02: 1000 mL

## 2012-07-02 MED ORDER — 0.9 % SODIUM CHLORIDE (POUR BTL) OPTIME
TOPICAL | Status: DC | PRN
  Administered 2012-07-02: 1000 mL

## 2012-07-02 MED ORDER — FENTANYL CITRATE 0.05 MG/ML IJ SOLN
25.0000 ug | INTRAMUSCULAR | Status: DC | PRN
  Administered 2012-07-02 (×2): 50 ug via INTRAVENOUS

## 2012-07-02 MED ORDER — PHENYLEPHRINE HCL 10 MG/ML IJ SOLN
INTRAMUSCULAR | Status: DC | PRN
  Administered 2012-07-02 (×3): 80 ug via INTRAVENOUS
  Administered 2012-07-02: 40 ug via INTRAVENOUS
  Administered 2012-07-02: 80 ug via INTRAVENOUS
  Administered 2012-07-02: 40 ug via INTRAVENOUS

## 2012-07-02 MED ORDER — EPHEDRINE SULFATE 50 MG/ML IJ SOLN
INTRAMUSCULAR | Status: DC | PRN
  Administered 2012-07-02: 10 mg via INTRAVENOUS
  Administered 2012-07-02: 5 mg via INTRAVENOUS

## 2012-07-02 MED ORDER — PHENOL 1.4 % MT LIQD: 1.0000 | OROMUCOSAL | Status: DC | PRN

## 2012-07-02 MED ORDER — METOCLOPRAMIDE HCL 5 MG/ML IJ SOLN: 5.0000 mg | Freq: Three times a day (TID) | INTRAMUSCULAR | Status: DC | PRN

## 2012-07-02 MED ORDER — ONDANSETRON HCL 4 MG/2ML IJ SOLN: 4.0000 mg | Freq: Four times a day (QID) | INTRAMUSCULAR | Status: DC | PRN

## 2012-07-02 MED ORDER — METHOCARBAMOL 500 MG PO TABS
500.0000 mg | ORAL_TABLET | Freq: Four times a day (QID) | ORAL | Status: DC | PRN
  Administered 2012-07-02 – 2012-07-05 (×9): 500 mg via ORAL
  Filled 2012-07-02 (×8): qty 1

## 2012-07-02 MED ORDER — ACETAMINOPHEN 10 MG/ML IV SOLN
INTRAVENOUS | Status: DC | PRN
  Administered 2012-07-02: 1000 mg via INTRAVENOUS

## 2012-07-02 MED ORDER — MENTHOL 3 MG MT LOZG: 1.0000 | LOZENGE | OROMUCOSAL | Status: DC | PRN

## 2012-07-02 MED ORDER — SODIUM CHLORIDE 0.9 % IV SOLN
INTRAVENOUS | Status: DC
  Administered 2012-07-02: 17:00:00 via INTRAVENOUS

## 2012-07-02 MED ORDER — OXYCODONE HCL 5 MG PO TABS
5.0000 mg | ORAL_TABLET | ORAL | Status: DC | PRN
  Administered 2012-07-02 – 2012-07-04 (×10): 10 mg via ORAL
  Administered 2012-07-04: 5 mg via ORAL
  Administered 2012-07-04 – 2012-07-05 (×4): 10 mg via ORAL
  Filled 2012-07-02 (×4): qty 2
  Filled 2012-07-02: qty 1
  Filled 2012-07-02 (×9): qty 2

## 2012-07-02 MED ORDER — OXYCODONE HCL ER 10 MG PO T12A
10.0000 mg | EXTENDED_RELEASE_TABLET | Freq: Two times a day (BID) | ORAL | Status: DC
  Administered 2012-07-02 – 2012-07-05 (×6): 10 mg via ORAL
  Filled 2012-07-02 (×6): qty 1

## 2012-07-02 MED ORDER — PROPOFOL 10 MG/ML IV BOLUS
INTRAVENOUS | Status: DC | PRN
  Administered 2012-07-02: 120 mg via INTRAVENOUS

## 2012-07-02 MED ORDER — KETAMINE HCL 10 MG/ML IJ SOLN
INTRAMUSCULAR | Status: DC | PRN
  Administered 2012-07-02: 5 mg via INTRAVENOUS
  Administered 2012-07-02: 10 mg via INTRAVENOUS

## 2012-07-02 MED ORDER — DEXAMETHASONE SODIUM PHOSPHATE 10 MG/ML IJ SOLN
INTRAMUSCULAR | Status: DC | PRN
  Administered 2012-07-02: 10 mg via INTRAVENOUS

## 2012-07-02 MED ORDER — METOCLOPRAMIDE HCL 10 MG PO TABS: 5.0000 mg | ORAL_TABLET | Freq: Three times a day (TID) | ORAL | Status: DC | PRN

## 2012-07-02 SURGICAL SUPPLY — 38 items
ADH SKN CLS APL DERMABOND .7 (GAUZE/BANDAGES/DRESSINGS) ×1
BAG SPEC THK2 15X12 ZIP CLS (MISCELLANEOUS) ×2
BAG ZIPLOCK 12X15 (MISCELLANEOUS) ×4 IMPLANT
BLADE SAW SGTL 18X1.27X75 (BLADE) ×2 IMPLANT
CELLS DAT CNTRL 66122 CELL SVR (MISCELLANEOUS) ×1 IMPLANT
CLOTH BEACON ORANGE TIMEOUT ST (SAFETY) ×2 IMPLANT
DERMABOND ADVANCED (GAUZE/BANDAGES/DRESSINGS) ×1
DERMABOND ADVANCED .7 DNX12 (GAUZE/BANDAGES/DRESSINGS) ×1 IMPLANT
DRAPE C-ARM 42X72 X-RAY (DRAPES) ×2 IMPLANT
DRAPE STERI IOBAN 125X83 (DRAPES) ×2 IMPLANT
DRAPE U-SHAPE 47X51 STRL (DRAPES) ×6 IMPLANT
DRSG AQUACEL AG ADV 3.5X10 (GAUZE/BANDAGES/DRESSINGS) ×2 IMPLANT
DURAPREP 26ML APPLICATOR (WOUND CARE) ×2 IMPLANT
ELECT BLADE TIP CTD 4 INCH (ELECTRODE) ×2 IMPLANT
ELECT REM PT RETURN 9FT ADLT (ELECTROSURGICAL) ×2
ELECTRODE REM PT RTRN 9FT ADLT (ELECTROSURGICAL) ×1 IMPLANT
FACESHIELD LNG OPTICON STERILE (SAFETY) ×8 IMPLANT
GLOVE BIO SURGEON STRL SZ7.5 (GLOVE) ×2 IMPLANT
GLOVE BIOGEL PI IND STRL 8 (GLOVE) ×3 IMPLANT
GLOVE BIOGEL PI INDICATOR 8 (GLOVE) ×3
GLOVE ECLIPSE 8.0 STRL XLNG CF (GLOVE) ×4 IMPLANT
GOWN STRL REIN XL XLG (GOWN DISPOSABLE) ×4 IMPLANT
HANDPIECE INTERPULSE COAX TIP (DISPOSABLE) ×2
KIT BASIN OR (CUSTOM PROCEDURE TRAY) ×2 IMPLANT
PACK TOTAL JOINT (CUSTOM PROCEDURE TRAY) ×2 IMPLANT
PADDING CAST COTTON 6X4 STRL (CAST SUPPLIES) ×2 IMPLANT
RETRACTOR WND ALEXIS 18 MED (MISCELLANEOUS) ×1 IMPLANT
RTRCTR WOUND ALEXIS 18CM MED (MISCELLANEOUS) ×2
SET HNDPC FAN SPRY TIP SCT (DISPOSABLE) ×1 IMPLANT
SUT ETHIBOND NAB CT1 #1 30IN (SUTURE) ×4 IMPLANT
SUT MNCRL AB 4-0 PS2 18 (SUTURE) ×2 IMPLANT
SUT NYLON 3 0 (SUTURE) ×2 IMPLANT
SUT VIC AB 1 CT1 36 (SUTURE) ×4 IMPLANT
SUT VIC AB 2-0 CT1 27 (SUTURE) ×4
SUT VIC AB 2-0 CT1 TAPERPNT 27 (SUTURE) ×2 IMPLANT
TOWEL OR 17X26 10 PK STRL BLUE (TOWEL DISPOSABLE) ×4 IMPLANT
TOWEL OR NON WOVEN STRL DISP B (DISPOSABLE) ×2 IMPLANT
TRAY FOLEY CATH 14FRSI W/METER (CATHETERS) ×2 IMPLANT

## 2012-07-02 NOTE — Evaluation (Signed)
Physical Therapy Evaluation Patient Details Name: [name] MRN: [id number] DOB: December 15, 1945 Today's Date: 07/02/2012 Time: 2536-6440 PT Time Calculation (min): 30 min  PT Assessment / Plan / Recommendation Clinical Impression  Pt presents s/p R THA (direct ant) POD 1 with decreased strength, ROM and mobility.  Tolerated OOB and ambulation in hallway well with min assist with no c/o dizziness/light headedness.  she did have increased pain when returning to room, however she had been premedicated prior to session.  Pt will benefit from skilled PT in acute venue to address deficits.  PT recommends HHPT for follow up at D/C to maximize pts safety and function.     PT Assessment  Patient needs continued PT services    Follow Up Recommendations  Home health PT    Does the patient have the potential to tolerate intense rehabilitation      Barriers to Discharge None      Equipment Recommendations  None recommended by PT    Recommendations for Other Services OT consult   Frequency 7X/week    Precautions / Restrictions     Pertinent Vitals/Pain 5/10 pain with amb, premedicated, ice pack applied      Mobility  Bed Mobility Bed Mobility: Supine to Sit Supine to Sit: 4: Min assist;HOB elevated Details for Bed Mobility Assistance: Assist for RLE out of bed with min cues for hand placement on bed to self assist.  Transfers Transfers: Sit to Stand;Stand to Sit Sit to Stand: 4: Min assist;From elevated surface;With upper extremity assist;From bed Stand to Sit: 4: Min assist;With upper extremity assist;With armrests;To chair/3-in-1 Details for Transfer Assistance: Assist to rise and steady with cues for hand placement and LE management when sitting/standing.  Ambulation/Gait Ambulation/Gait Assistance: 4: Min assist Ambulation Distance (Feet): 50 Feet Assistive device: Rolling walker Ambulation/Gait Assistance Details: cues for sequencing/technique with RW and to maintain upright  posture.  Also required some cues when returning to room for increased UE WB to decrease antalgic gait pattern.  Gait Pattern: Step-to pattern;Decreased stride length;Antalgic;Trunk flexed Stairs: No Wheelchair Mobility Wheelchair Mobility: No    Exercises     PT Diagnosis: Difficulty walking;Generalized weakness;Acute pain  PT Problem List: Decreased strength;Decreased range of motion;Decreased activity tolerance;Decreased balance;Decreased mobility;Decreased knowledge of use of DME;Pain PT Treatment Interventions: DME instruction;Gait training;Functional mobility training;Therapeutic activities;Therapeutic exercise;Balance training;Patient/family education   PT Goals Acute Rehab PT Goals PT Goal Formulation: With patient/family Time For Goal Achievement: 07/05/12 Potential to Achieve Goals: Good Pt will go Supine/Side to Sit: with supervision PT Goal: Supine/Side to Sit - Progress: Goal set today Pt will go Sit to Supine/Side: with supervision PT Goal: Sit to Supine/Side - Progress: Goal set today Pt will go Sit to Stand: with supervision PT Goal: Sit to Stand - Progress: Goal set today Pt will go Stand to Sit: with supervision PT Goal: Stand to Sit - Progress: Goal set today Pt will Ambulate: 51 - 150 feet;with supervision;with least restrictive assistive device PT Goal: Ambulate - Progress: Goal set today Pt will Perform Home Exercise Program: with supervision, verbal cues required/provided PT Goal: Perform Home Exercise Program - Progress: Goal set today  Visit Information  Last PT Received On: 07/02/12 Assistance Needed: +1    Subjective Data  Subjective: I heard you were coming Patient Stated Goal: to return home.    Prior Functioning  Home Living Lives With: Spouse;Family Available Help at Discharge: Family;Available 24 hours/day Type of Home: House Home Access: Level entry Home Layout: Two level;Able to live on  main level with bedroom/bathroom Bathroom  Shower/Tub: Walk-in shower;Door [employer] Toilet: Standard Home Adaptive Equipment: Shower chair without back;Bedside commode/3-in-1;Walker - rolling;Straight cane;Grab bars in shower Prior Function Level of Independence: Independent with assistive device(s) Able to Take Stairs?: Yes Driving: Yes Vocation: Retired Musician: No difficulties    Copywriter, advertising Arousal/Alertness: Awake/alert Behavior During Therapy: WFL for tasks assessed/performed Overall Cognitive Status: Within Functional Limits for tasks assessed    Extremity/Trunk Assessment Right Lower Extremity Assessment RLE ROM/Strength/Tone: Deficits RLE ROM/Strength/Tone Deficits: ankle motions WFL, requires assist for all other motions.  RLE Sensation: WFL - Light Touch Left Lower Extremity Assessment LLE ROM/Strength/Tone: WFL for tasks assessed LLE Sensation: WFL - Light Touch Trunk Assessment Trunk Assessment: Normal   Balance    End of Session PT - End of Session Equipment Utilized During Treatment: Gait belt Activity Tolerance: Patient tolerated treatment well;Patient limited by pain Patient left: in chair;with call bell/phone within reach Nurse Communication: Mobility status  GP     Vista Deck 07/02/2012, 5:16 PM

## 2012-07-02 NOTE — Preoperative (Signed)
Beta Blockers   Reason not to administer Beta Blockers:Not Applicable, not on home BB 

## 2012-07-02 NOTE — Transfer of Care (Signed)
Immediate Anesthesia Transfer of Care Note  Patient: [name]  Procedure(s) Performed: Procedure(s): RIGHT TOTAL HIP ARTHROPLASTY ANTERIOR APPROACH (Right)  Patient Location: PACU  Anesthesia Type:General  Level of Consciousness: awake, oriented, patient cooperative and responds to stimulation  Airway & Oxygen Therapy: Patient Spontanous Breathing and Patient connected to face mask oxygen  Post-op Assessment: Report given to PACU RN, Post -op Vital signs reviewed and stable and Patient moving all extremities X 4  Post vital signs: Reviewed, stable  Complications: No apparent anesthesia complications

## 2012-07-02 NOTE — H&P (Signed)
TOTAL HIP ADMISSION H&P  Patient is admitted for right total hip arthroplasty.  Subjective:  Chief Complaint: right hip pain  HPI: [name], 67 y.o. female, has a history of pain and functional disability in the right hip(s) due to arthritis and patient has failed non-surgical conservative treatments for greater than 12 weeks to include NSAID's and/or analgesics, corticosteriod injections, use of assistive devices, weight reduction as appropriate and activity modification.  Onset of symptoms was gradual starting 5 years ago with gradually worsening course since that time.The patient noted no past surgery on the right hip(s).  Patient currently rates pain in the right hip at 8 out of 10 with activity. Patient has night pain, worsening of pain with activity and weight bearing, pain that interfers with activities of daily living and pain with passive range of motion. Patient has evidence of subchondral cysts, subchondral sclerosis, periarticular osteophytes and joint space narrowing by imaging studies. This condition presents safety issues increasing the risk of falls.  There is no current active infection.  Patient Active Problem List   Diagnosis Date Noted  . Degenerative arthritis of hip 07/02/2012  . Osteoarthritis of right hip 05/24/2012  . GE reflux 09/23/2011  . Hypothyroidism 01/07/2011  . Anxiety 01/07/2011  . Allergic rhinitis 01/07/2011  . 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 Thyroid disease   . Allergy   . Allergic bronchitis   . Allergic bronchitis   . Arthritis   . HSV-2 (herpes simplex virus 2) infection   . Hypothyroidism   . GERD (gastroesophageal reflux disease)     occas problem - burning, cough would take prilosec if needed  . H/O hiatal hernia   . 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 Claustrophobia     Past Surgical History  Procedure Laterality Date  .  Cholecystectomy    . Breast biopsy  1972    benign  . Lumbar laminectomy  1999    L4  . Lumbar laminectomy  1981    L5  . Back surgery      No prescriptions prior to admission   Allergies  Allergen Reactions  . Betadine (Povidone Iodine) Rash and Other (See Comments)    blisters  . Penicillins Anaphylaxis    History  Substance Use Topics  . Smoking status: Never Smoker   . Smokeless tobacco: Never Used  . Alcohol Use: Yes     Comment: socially    Family History  Problem Relation Age of Onset  . Heart disease Father   . Hypertension Sister   . Heart disease Brother   . Asthma Daughter   . Hypertension Sister      Review of Systems  Musculoskeletal: Positive for joint pain.  All other systems reviewed and are negative.    Objective:  Physical Exam  Constitutional: She is oriented to person, place, and time. She appears well-developed and well-nourished.  HENT:  Head: Normocephalic and atraumatic.  Eyes: EOM are normal. Pupils are equal, round, and reactive to light.  Neck: Normal range of motion. Neck supple.  Cardiovascular: Normal rate and regular rhythm.   Respiratory: Effort normal and breath sounds normal.  GI: Soft. Bowel sounds are normal.  Musculoskeletal:       Right hip: She exhibits decreased range of motion, decreased strength, bony tenderness and crepitus.  Neurological: She is alert and oriented to person, place, and  time.  Skin: Skin is warm and dry.  Psychiatric: She has a normal mood and affect.    Vital signs in last 24 hours:    Labs:   Estimated body mass index is 25.65 kg/(m^2) as calculated from the following:   Height as of 09/23/11: 5\' 4"  (1.626 m).   Weight as of 05/24/12: 67.813 kg (149 lb 8 oz).   Imaging Review Plain radiographs demonstrate severe degenerative joint disease of the right hip(s). The bone quality appears to be excellent for age and reported activity level.  Assessment/Plan:  End stage arthritis, right  hip(s)  The patient history, physical examination, clinical judgement of the provider and imaging studies are consistent with end stage degenerative joint disease of the right hip(s) and total hip arthroplasty is deemed medically necessary. The treatment options including medical management, injection therapy, arthroscopy and arthroplasty were discussed at length. The risks and benefits of total hip arthroplasty were presented and reviewed. The risks due to aseptic loosening, infection, stiffness, dislocation/subluxation,  thromboembolic complications and other imponderables were discussed.  The patient acknowledged the explanation, agreed to proceed with the plan and consent was signed. Patient is being admitted for inpatient treatment for surgery, pain control, PT, OT, prophylactic antibiotics, VTE prophylaxis, progressive ambulation and ADL's and discharge planning.The patient is planning to be discharged home with home health services

## 2012-07-02 NOTE — Anesthesia Preprocedure Evaluation (Signed)
Anesthesia Evaluation  Patient identified by MRN, date of birth, ID band Patient awake    Reviewed: Allergy & Precautions, H&P , NPO status , Patient's Chart, lab work & pertinent test results  Airway Mallampati: II TM Distance: >3 FB Neck ROM: Full    Dental no notable dental hx.    Pulmonary neg pulmonary ROS,  breath sounds clear to auscultation  Pulmonary exam normal       Cardiovascular negative cardio ROS  Rhythm:Regular Rate:Normal     Neuro/Psych  Neuromuscular disease negative neurological ROS  negative psych ROS   GI/Hepatic negative GI ROS, Neg liver ROS, hiatal hernia,   Endo/Other  negative endocrine ROS  Renal/GU negative Renal ROS  negative genitourinary   Musculoskeletal negative musculoskeletal ROS (+)   Abdominal   Peds negative pediatric ROS (+)  Hematology negative hematology ROS (+)   Anesthesia Other Findings   Reproductive/Obstetrics negative OB ROS                           Anesthesia Physical Anesthesia Plan  ASA: II  Anesthesia Plan: General   Post-op Pain Management:    Induction: Intravenous  Airway Management Planned: Oral ETT  Additional Equipment:   Intra-op Plan:   Post-operative Plan: Extubation in OR  Informed Consent: I have reviewed the patients History and Physical, chart, labs and discussed the procedure including the risks, benefits and alternatives for the proposed anesthesia with the patient or authorized representative who has indicated his/her understanding and acceptance.   Dental advisory given  Plan Discussed with: CRNA  Anesthesia Plan Comments:         Anesthesia Quick Evaluation

## 2012-07-02 NOTE — Brief Op Note (Signed)
07/02/2012  2:02 PM  PATIENT:  Vanessa Trilby  67 y.o. female  PRE-OPERATIVE DIAGNOSIS:  Severe osteoarthritis right hip  POST-OPERATIVE DIAGNOSIS:  Severe osteoarthritis right hip  PROCEDURE:  Procedure(s): RIGHT TOTAL HIP ARTHROPLASTY ANTERIOR APPROACH (Right)  SURGEON:  Surgeon(s) and Role:    * Kathryne Hitch, MD - Primary    * Nestor Lewandowsky, MD - Assisting  ANESTHESIA:   general  EBL:  Total I/O In: 2100 [I.V.:2100] Out: 875 [Urine:200; Blood:675]  BLOOD ADMINISTERED:none  DRAINS: none   LOCAL MEDICATIONS USED:  NONE  SPECIMEN:  No Specimen  DISPOSITION OF SPECIMEN:  N/A  COUNTS:  YES  TOURNIQUET:  * No tourniquets in log *  DICTATION: .Other Dictation: Dictation Number [id number]  PLAN OF CARE: Admit to inpatient   PATIENT DISPOSITION:  PACU - hemodynamically stable.   Delay start of Pharmacological VTE agent (>24hrs) due to surgical blood loss or risk of bleeding: no

## 2012-07-02 NOTE — Anesthesia Postprocedure Evaluation (Signed)
  Anesthesia Post-op Note  Patient: [name]  Procedure(s) Performed: Procedure(s) (LRB): RIGHT TOTAL HIP ARTHROPLASTY ANTERIOR APPROACH (Right)  Patient Location: PACU  Anesthesia Type: General  Level of Consciousness: awake and alert   Airway and Oxygen Therapy: Patient Spontanous Breathing  Post-op Pain: mild  Post-op Assessment: Post-op Vital signs reviewed, Patient's Cardiovascular Status Stable, Respiratory Function Stable, Patent Airway and No signs of Nausea or vomiting  Last Vitals:  Filed Vitals:   07/02/12 0944  BP: 145/73  Pulse: 78  Temp: 36.4 C  Resp: 16    Post-op Vital Signs: stable   Complications: No apparent anesthesia complications

## 2012-07-02 NOTE — Plan of Care (Signed)
Problem: Consults Goal: Diagnosis- Total Joint Replacement Primary Total Hip     

## 2012-07-03 LAB — BASIC METABOLIC PANEL
BUN: 10 mg/dL (ref 6–23)
Chloride: 97 mEq/L (ref 96–112)
Creatinine, Ser: 0.5 mg/dL (ref 0.50–1.10)
Glucose, Bld: 123 mg/dL — ABNORMAL HIGH (ref 70–99)
Potassium: 4.1 mEq/L (ref 3.5–5.1)

## 2012-07-03 LAB — CBC
HCT: 30.3 % — ABNORMAL LOW (ref 36.0–46.0)
Hemoglobin: 10.2 g/dL — ABNORMAL LOW (ref 12.0–15.0)
MCHC: 33.7 g/dL (ref 30.0–36.0)
MCV: 90.4 fL (ref 78.0–100.0)
RDW: 13.1 % (ref 11.5–15.5)
WBC: 12.2 10*3/uL — ABNORMAL HIGH (ref 4.0–10.5)

## 2012-07-03 MED ORDER — ACETAMINOPHEN 325 MG PO TABS
650.0000 mg | ORAL_TABLET | Freq: Four times a day (QID) | ORAL | Status: DC | PRN
  Administered 2012-07-03: 650 mg via ORAL
  Filled 2012-07-03: qty 2

## 2012-07-03 NOTE — Progress Notes (Signed)
Patient ID: [name], female   DOB: 1945/10/06, 67 y.o.   MRN: [id number] Patient is postoperative day 1 total hip arthroplasty. Hemoglobin is stable. No complaints. Physical therapy progressive ambulation no weight restrictions.

## 2012-07-03 NOTE — Progress Notes (Signed)
Physical Therapy Treatment Patient Details Name: [name] MRN: [id number] DOB: 09/17/1945 Today's Date: 07/03/2012 Time: 7829-5621 PT Time Calculation (min): 30 min  PT Assessment / Plan / Recommendation Comments on Treatment Session  Pt progressing very well with all mobility.  Some guarding with ambulation and exercises, however does well with cues.     Follow Up Recommendations  Home health PT     Does the patient have the potential to tolerate intense rehabilitation     Barriers to Discharge        Equipment Recommendations  None recommended by PT    Recommendations for Other Services    Frequency 7X/week   Plan Discharge plan remains appropriate    Precautions / Restrictions Precautions Precautions: None Restrictions Weight Bearing Restrictions: No Other Position/Activity Restrictions: WBAT   Pertinent Vitals/Pain 4/10 pain, burning, ice pack applied    Mobility  Bed Mobility Bed Mobility: Not assessed (Pt was in restroom when PT arrived. ) Transfers Transfers: Stand to Sit Stand to Sit: 4: Min guard;With upper extremity assist;With armrests;To chair/3-in-1 Details for Transfer Assistance: Min/guard for safety with cues for hand placement and LE management.  Ambulation/Gait Ambulation/Gait Assistance: 4: Min guard Ambulation Distance (Feet): 165 Feet Assistive device: Rolling walker Ambulation/Gait Assistance Details: Cues for increasing UE WB to decrease antalgic gait pattern and also for increasing hip/knee flex on R when ambulating to better simulate swing phase.  Gait Pattern: Step-to pattern;Decreased stride length;Antalgic;Trunk flexed    Exercises Total Joint Exercises Ankle Circles/Pumps: AROM;Both;20 reps Quad Sets: Strengthening;Both;10 reps Heel Slides: AROM;Right;10 reps   PT Diagnosis:    PT Problem List:   PT Treatment Interventions:     PT Goals Acute Rehab PT Goals PT Goal Formulation: With patient/family Time For Goal Achievement:  07/05/12 Potential to Achieve Goals: Good Pt will go Sit to Stand: with supervision PT Goal: Sit to Stand - Progress: Progressing toward goal Pt will go Stand to Sit: with supervision PT Goal: Stand to Sit - Progress: Progressing toward goal Pt will Ambulate: 51 - 150 feet;with supervision;with least restrictive assistive device PT Goal: Ambulate - Progress: Progressing toward goal Pt will Perform Home Exercise Program: with supervision, verbal cues required/provided PT Goal: Perform Home Exercise Program - Progress: Progressing toward goal  Visit Information  Last PT Received On: 07/03/12 Assistance Needed: +1    Subjective Data  Subjective: I did some therapy before the surgery hoping I could avoid surgery.  Patient Stated Goal: to return home.    Cognition  Cognition Arousal/Alertness: Awake/alert Behavior During Therapy: WFL for tasks assessed/performed Overall Cognitive Status: Within Functional Limits for tasks assessed    Balance     End of Session PT - End of Session Activity Tolerance: Patient tolerated treatment well Patient left: in chair;with call bell/phone within reach;with family/visitor present Nurse Communication: Mobility status   GP     Vista Deck 07/03/2012, 9:42 AM

## 2012-07-03 NOTE — Op Note (Signed)
NAMEMICHELLE, WNEK NO.:  [id number]  MEDICAL RECORD NO.:  [id number]  LOCATION:  1537                         FACILITY:  [facility]  PHYSICIAN:  Vanita Panda. Magnus Ivan, M.D.DATE OF BIRTH:  04/23/1945  DATE OF PROCEDURE:  07/02/2012 DATE OF DISCHARGE:                              OPERATIVE REPORT   PREOPERATIVE DIAGNOSES:  Severe end-stage arthritis and degenerative joint disease, right hip.  POSTOPERATIVE DIAGNOSES:  Severe end-stage arthritis and degenerative joint disease, right hip.  PROCEDURE:  Right total hip arthroplasty direct anterior approach.  IMPLANTS:  DePuy Sector Gription acetabular component size 48, size 32+ 4 neutral polyethylene liner, size 10 Corail femoral component with standard offset, size 32+ 1 ceramic hip ball.  SURGEON:  Vanita Panda. Magnus Ivan, M.D.  ASSISTANT:  Gean Birchwood, MD.  ANESTHESIA:  General.  ANTIBIOTICS:  2 g IV Ancef.  BLOOD LOSS:  675 mL.  COMPLICATIONS:  None.  INDICATIONS:  Ms. Dearing is a very pleasant 67 year old individual who is very active and physiologically young for her stated age.  She has severe end-stage arthritis of her right hip.  This has been going on for over 5 years now.  She has had bone-on-bone wear especially the superior lateral aspect of her hip.  This is evident on radiographs.  Her activities of daily living, steroid administer, pain is daily.  At this point she has failed conservative treatment with rest, anti- inflammatories.  At this time, I think she had injection.  She has been seen by several outside physicians and then another orthopedic physician who recommended hip replacement surgery.  She wished to proceed with a direct anterior hip.  The risks, benefits were explained to her in detail and were documented.  She wished to proceed.  PROCEDURE DESCRIPTION:  After informed consent was obtained, appropriate right hip was marked.  She was brought to the operating room.   General anesthesia was obtained while she was on her stretcher.  Foley catheter was placed and then traction boots were placed on both feet.  Next, she was placed supine on the Hana fracture table with perineal post in place and both legs in inline skeletal traction but no traction applied.  The right hip was prepped with ChloraPrep and sterile drapes.  A time-out was called.  She was identified as correct patient, correct right hip. We then made an incision just inferior and posterior to the anterior superior iliac spine and carried this obliquely down the leg.  I dissected down to the tensor fascia.  The tensor fascia was divided longitudinally.  I proceeded with a direct approach to the hip.  A Cobra retractor was placed around the lateral neck and up underneath the rectus femoris, a medial retractor was placed.  I cauterized the lateral femoral circumflex vessels.  I then opened up the hip capsule and placed Cobra retractors within the hip capsule.  We found significant deformity of the femoral head.  I made my femoral neck cut just proximal to the lesser trochanter with an oscillating saw and completed this with an osteotome.  I then placed a corkscrew guide in the femoral head and removed the femoral head in its entirety.  We did find it to have large areas of no cartilage at all.  We then cleaned the acetabulum debris and placed a Bent Hohmann medially and a Cobra retractor laterally.  I then began reaming from a size 42 reamer in 2 mm increments up to a size 48 with all reamers placed under direct visualization.  Last reamer also placed under direct fluoroscopy to get standard depth of reamer inclination and anteversion.  Once this was done, we placed the real DePuy Sector Gription acetabular component size 48 and a real 32+ 4 neutral polyethylene liner.  Next, attention was turned to the femur with all traction off the leg.  We externally rotated the leg to 90 degrees, extended  and adducted.  We were able to place a Mueller retractor medially and a Bent Hohmann behind the greater trochanter.  I released the lateral joint capsule and then used a rongeur and a box cutting guide in the femoral canal.  We then again broaching with a size 8 broach up to a size 10 and the 10 was too tight and stable.  We used a calcar planer for this and then trialed a 32+ 1 hip ball.  We brought the leg back over and up with traction and internal rotation, reduced in the pelvis.  She was stable on rotation.  Had minimal shuck and her leg lengths were measured near equal under direct fluoroscopy.  We then dislocated the hip and removed the trial components.  We placed a real Corail femoral component from DePuy size 10 with standard offset and the real 32+ 1 ceramic hip ball.  We brought the leg back over and up for traction internal rotation and reduced in the pelvis that was again stable.  We copiously irrigated the soft tissues with normal saline solution using pulsatile lavage and closed the joint capsule with interrupted #1 Ethibond suture followed by a #1 Vicryl in the tensor fascia, 0 Vicryl in the deep tissue, 2-0 Vicryl in subcutaneous tissue, and 4-0 Monocryl subcuticular stitch.  Dermabond was placed in the skin, and then a Aquacel dressing.  She was then taken off the Hana table, awakened, extubated, taken to recovery room in stable condition.  All final counts correct.  There were no complications noted.     Vanita Panda. Magnus Ivan, M.D.     CYB/MEDQ  D:  07/02/2012  T:  07/03/2012  Job:  [id number]

## 2012-07-03 NOTE — Evaluation (Signed)
Occupational Therapy Evaluation Patient Details Name: [name] MRN: [id number] DOB: Jul 21, 1945 Today's Date: 07/03/2012 Time: 4098-1191 OT Time Calculation (min): 26 min  OT Assessment / Plan / Recommendation Clinical Impression   This 67 y.o. Female admitted for Rt. THA direct anterior approach.  Pt is very motivated.  Currently, she requires min - mod A for LB ADLs, but should progress rapidly - limited by pain.  She has adequate assist at home and all DME needed.  All education completed.  No further OT needs identified.  Will sign off.     OT Assessment  Patient does not need any further OT services    Follow Up Recommendations  No OT follow up;Supervision - Intermittent    Barriers to Discharge None    Equipment Recommendations  None recommended by OT    Recommendations for Other Services    Frequency       Precautions / Restrictions Precautions Precautions: None Restrictions Weight Bearing Restrictions: No Other Position/Activity Restrictions: WBAT       ADL  Eating/Feeding: Independent Where Assessed - Eating/Feeding: Bed level;Chair Grooming: Wash/dry hands;Wash/dry face;Teeth care;Min guard Where Assessed - Grooming: Supported standing Upper Body Bathing: Set up Where Assessed - Upper Body Bathing: Supported sitting Lower Body Bathing: Minimal assistance Where Assessed - Lower Body Bathing: Supported sit to stand Upper Body Dressing: Set up Where Assessed - Upper Body Dressing: Unsupported sitting Lower Body Dressing: Moderate assistance Where Assessed - Lower Body Dressing: Supported sit to stand Toilet Transfer: Hydrographic surveyor Method: Sit to Barista: Raised toilet seat with arms (or 3-in-1 over toilet) Toileting - Clothing Manipulation and Hygiene: Min guard Where Assessed - Glass blower/designer Manipulation and Hygiene: Standing Equipment Used: Rolling walker Transfers/Ambulation Related to ADLs: min guard  ADL  Comments: Pt unable to access Rt. foot for LB ADLs due to discomfort, but was able to reach to heel.  Pt encouraged, to continue attempts to access feet for LB ADLs, and to move only within pain tolerance.  She verbalizes understanding.  Pt reports she will have adequate assist at home    OT Diagnosis:    OT Problem List:   OT Treatment Interventions:     OT Goals    Visit Information  Last OT Received On: 07/03/12 Assistance Needed: +1    Subjective Data  Subjective: "I just so badly wanted to do it"  re: donning sock Patient Stated Goal: To regain independence   Prior Functioning     Home Living Lives With: Spouse;Family Available Help at Discharge: Family;Available 24 hours/day Type of Home: House Home Access: Level entry Home Layout: Two level;Able to live on main level with bedroom/bathroom Bathroom Shower/Tub: Walk-in shower;Door [employer] Toilet: Standard Home Adaptive Equipment: Shower chair without back;Bedside commode/3-in-1;Walker - rolling;Straight cane;Grab bars in shower Prior Function Level of Independence: Independent with assistive device(s) Able to Take Stairs?: Yes Driving: Yes Vocation: Retired Musician: No difficulties         Vision/Perception     Copywriter, advertising Arousal/Alertness: Awake/alert Behavior During Therapy: WFL for tasks assessed/performed Overall Cognitive Status: Within Functional Limits for tasks assessed    Extremity/Trunk Assessment Right Upper Extremity Assessment RUE ROM/Strength/Tone: Within functional levels RUE Coordination: WFL - gross/fine motor Left Upper Extremity Assessment LUE ROM/Strength/Tone: Within functional levels LUE Coordination: WFL - gross/fine motor     Mobility Bed Mobility Bed Mobility: Sit to Supine Sit to Supine: 4: Min guard;HOB flat Transfers Transfers: Sit to Stand;Stand to [employer]  to Stand: 4: Min guard;With upper extremity assist;From chair/3-in-1 Stand to Sit:  4: Min guard;With upper extremity assist;To bed Details for Transfer Assistance: VC's for hand placement     Exercise Total Joint Exercises Ankle Circles/Pumps: AROM;Both;20 reps Quad Sets: Strengthening;Both;10 reps Heel Slides: AROM;Right;10 reps   Balance     End of Session OT - End of Session Activity Tolerance: Patient tolerated treatment well Patient left: in bed;with call bell/phone within reach;with family/visitor present  GO     Brandom Kerwin, Ursula Alert M 07/03/2012, 10:55 AM

## 2012-07-03 NOTE — Progress Notes (Signed)
PT note: Pt declined second PT session due to feeling like she had a fever and weak, but will be ready tomorrow.    Thanks, Harriet Butte, PT

## 2012-07-04 LAB — CBC
HCT: 24.2 % — ABNORMAL LOW (ref 36.0–46.0)
Hemoglobin: 8.5 g/dL — ABNORMAL LOW (ref 12.0–15.0)
MCH: 31.6 pg (ref 26.0–34.0)
MCHC: 35.1 g/dL (ref 30.0–36.0)
MCV: 90 fL (ref 78.0–100.0)
RDW: 13.5 % (ref 11.5–15.5)

## 2012-07-04 NOTE — Progress Notes (Signed)
Physical Therapy Treatment Patient Details Name: [name] MRN: [id number] DOB: 02-14-1946 Today's Date: 07/04/2012 Time: [phone] PT Time Calculation (min): 42 min  PT Assessment / Plan / Recommendation Comments on Treatment Session  Pt continues to progress well with therapy and anticipate D/C tomorrow am.     Follow Up Recommendations  Home health PT     Does the patient have the potential to tolerate intense rehabilitation     Barriers to Discharge        Equipment Recommendations  None recommended by PT    Recommendations for Other Services    Frequency 7X/week   Plan Discharge plan remains appropriate    Precautions / Restrictions Precautions Precautions: None Restrictions Weight Bearing Restrictions: No Other Position/Activity Restrictions: WBAT   Pertinent Vitals/Pain 4/10, premedicated.     Mobility  Bed Mobility Bed Mobility: Supine to Sit Supine to Sit: 4: Min guard Details for Bed Mobility Assistance: Guarding assist for RLE out of bed.  Pt demos good UE technique.  Transfers Transfers: Sit to Stand;Stand to Sit Sit to Stand: 5: Supervision;From bed Stand to Sit: 5: Supervision;With armrests;To chair/3-in-1 Details for Transfer Assistance: Min cues for hand placement.  Ambulation/Gait Ambulation/Gait Assistance: 5: Supervision Ambulation Distance (Feet): 500 Feet Assistive device: Rolling walker Ambulation/Gait Assistance Details: Continues to require verbal cues for decreasing antalgic gait pattern.  She tends to favor R side, but noted the less she focuses on it, the better her gait pattern.  Gait Pattern: Step-to pattern;Decreased stride length;Antalgic;Trunk flexed    Exercises Total Joint Exercises Ankle Circles/Pumps: AROM;Both;20 reps Quad Sets: Strengthening;Both;10 reps Short Arc Quad: AROM;Right;10 reps Heel Slides: AROM;Right;10 reps Hip ABduction/ADduction: AAROM;Right;10 reps   PT Diagnosis:    PT Problem List:   PT Treatment  Interventions:     PT Goals Acute Rehab PT Goals PT Goal Formulation: With patient/family Time For Goal Achievement: 07/05/12 Potential to Achieve Goals: Good Pt will go Supine/Side to Sit: with supervision PT Goal: Supine/Side to Sit - Progress: Progressing toward goal Pt will go Sit to Stand: with supervision PT Goal: Sit to Stand - Progress: Progressing toward goal Pt will go Stand to Sit: with supervision PT Goal: Stand to Sit - Progress: Progressing toward goal Pt will Ambulate: 51 - 150 feet;with least restrictive assistive device;with modified independence PT Goal: Ambulate - Progress: Progressing toward goal Pt will Perform Home Exercise Program: with supervision, verbal cues required/provided PT Goal: Perform Home Exercise Program - Progress: Progressing toward goal  Visit Information  Last PT Received On: 07/04/12 Assistance Needed: +1    Subjective Data  Subjective: I can't walk without that limp Patient Stated Goal: to return home.    Cognition  Cognition Arousal/Alertness: Awake/alert Behavior During Therapy: WFL for tasks assessed/performed Overall Cognitive Status: Within Functional Limits for tasks assessed    Balance     End of Session PT - End of Session Activity Tolerance: Patient tolerated treatment well Patient left: in chair;with call bell/phone within reach;with family/visitor present Nurse Communication: Mobility status   GP     Vista Deck 07/04/2012, 4:24 PM

## 2012-07-04 NOTE — Progress Notes (Signed)
Patient ID: [name], female   DOB: 1946-02-01, 67 y.o.   MRN: [id number] Postoperative day 2 total hip arthroplasty. Hemoglobin 8.5. Discontinue IV. Continue therapy possible discharge to home tomorrow.

## 2012-07-04 NOTE — Progress Notes (Signed)
Physical Therapy Treatment Patient Details Name: [name] MRN: [id number] DOB: [birth year] Today's Date: 07/04/2012 Time: 0910-0933 PT Time Calculation (min): 23 min  PT Assessment / Plan / Recommendation Comments on Treatment Session  Pt continues to progress well with therapy and anticipate D/C tomorrow am.     Follow Up Recommendations  Home health PT     Does the patient have the potential to tolerate intense rehabilitation     Barriers to Discharge        Equipment Recommendations  None recommended by PT    Recommendations for Other Services    Frequency 7X/week   Plan Discharge plan remains appropriate    Precautions / Restrictions Precautions Precautions: None Restrictions Weight Bearing Restrictions: No Other Position/Activity Restrictions: WBAT   Pertinent Vitals/Pain 5/10 pain, continues to have burning pain in upper thigh, ice pack applied    Mobility  Bed Mobility Bed Mobility: Not assessed (Pt ambulating from restroom when PT arrived) Transfers Transfers: Stand to Sit Sit to Stand: 5: Supervision;With upper extremity assist;From chair/3-in-1;With armrests Details for Transfer Assistance: Min cues for hand placement.  Ambulation/Gait Ambulation/Gait Assistance: 5: Supervision Ambulation Distance (Feet): 500 Feet (needed to take 3-4 standing rest breaks in between. ) Assistive device: Rolling walker Ambulation/Gait Assistance Details: Continue to provide cues and demonstration for more fluid gait pattern whether it be step to or step through pattern.  Also for use of UEs to decrease antalgic gait pattern as needed.   Gait Pattern: Step-to pattern;Decreased stride length;Antalgic;Trunk flexed    Exercises     PT Diagnosis:    PT Problem List:   PT Treatment Interventions:     PT Goals Acute Rehab PT Goals PT Goal Formulation: With patient/family Time For Goal Achievement: 07/05/12 Potential to Achieve Goals: Good Pt will go Sit to Stand: with  supervision PT Goal: Sit to Stand - Progress: Progressing toward goal Pt will go Stand to Sit: with supervision PT Goal: Stand to Sit - Progress: Met Pt will Ambulate: 51 - 150 feet;with least restrictive assistive device;with modified independence PT Goal: Ambulate - Progress: Updated due to goal met  Visit Information  Last PT Received On: 07/04/12 Assistance Needed: +1    Subjective Data  Subjective: I'm feeling better this morning than I did yesterday afternoon.  Patient Stated Goal: to return home.    Cognition  Cognition Arousal/Alertness: Awake/alert Behavior During Therapy: WFL for tasks assessed/performed Overall Cognitive Status: Within Functional Limits for tasks assessed    Balance     End of Session PT - End of Session Activity Tolerance: Patient tolerated treatment well Patient left: in chair;with call bell/phone within reach;with family/visitor present Nurse Communication: Mobility status   GP     Vista Deck 07/04/2012, 11:41 AM

## 2012-07-05 ENCOUNTER — Encounter (HOSPITAL_COMMUNITY): Payer: Self-pay | Admitting: Orthopaedic Surgery

## 2012-07-05 LAB — CBC
HCT: 23.1 % — ABNORMAL LOW (ref 36.0–46.0)
MCV: 88.5 fL (ref 78.0–100.0)
Platelets: 185 10*3/uL (ref 150–400)
RBC: 2.61 MIL/uL — ABNORMAL LOW (ref 3.87–5.11)
RDW: 13 % (ref 11.5–15.5)
WBC: 7.3 10*3/uL (ref 4.0–10.5)

## 2012-07-05 MED ORDER — ASPIRIN 325 MG PO TBEC: 325.0000 mg | DELAYED_RELEASE_TABLET | Freq: Two times a day (BID) | ORAL | Status: DC

## 2012-07-05 MED ORDER — OXYCODONE-ACETAMINOPHEN 5-325 MG PO TABS: 1.0000 | ORAL_TABLET | ORAL | Status: DC | PRN

## 2012-07-05 MED ORDER — FERROUS SULFATE 325 (65 FE) MG PO TABS: 325.0000 mg | ORAL_TABLET | Freq: Three times a day (TID) | ORAL | Status: DC

## 2012-07-05 NOTE — Progress Notes (Signed)
Patient discharged via wheelchair. Rx for percocet given. States understanding of discharge instructions. HH set up per case management.

## 2012-07-05 NOTE — Discharge Summary (Signed)
Patient ID: [name] MRN: [id number] DOB/AGE: 17-Mar-1946 33 y.o.  Admit date: 07/02/2012 Discharge date: 07/05/2012  Admission Diagnoses:  Principal Problem:   Degenerative arthritis of hip   Discharge Diagnoses:  Same  Past Medical History  Diagnosis Date  . Thyroid disease   . Allergy   . Allergic bronchitis   . Allergic bronchitis   . Arthritis   . HSV-2 (herpes simplex virus 2) infection   . Hypothyroidism   . GERD (gastroesophageal reflux disease)     occas problem - burning, cough would take prilosec if needed  . H/O hiatal hernia   . 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 Claustrophobia     Surgeries: Procedure(s): RIGHT TOTAL HIP ARTHROPLASTY ANTERIOR APPROACH on 07/02/2012   Consultants:    Discharged Condition: Improved  Hospital Course: [name] is an 67 y.o. female who was admitted 07/02/2012 for operative treatment ofDegenerative arthritis of hip. Patient has severe unremitting pain that affects sleep, daily activities, and work/hobbies. After pre-op clearance the patient was taken to the operating room on 07/02/2012 and underwent  Procedure(s): RIGHT TOTAL HIP ARTHROPLASTY ANTERIOR APPROACH.    Patient was given perioperative antibiotics: Anti-infectives   Start     Dose/Rate Route Frequency Ordered Stop   07/02/12 1630  clindamycin (CLEOCIN) IVPB 600 mg     600 mg 100 mL/hr over 30 Minutes Intravenous Every 6 hours 07/02/12 1557 07/02/12 2221       Patient was given sequential compression devices, early ambulation, and chemoprophylaxis to prevent DVT.  Patient benefited maximally from hospital stay and there were no complications.    Recent vital signs: Patient Vitals for the past 24 hrs:  BP Temp Temp src Pulse Resp SpO2  07/05/12 0420 97/50 mmHg 98.9 F (37.2 C) Oral 87 20 93 %  07/04/12 2134 95/43 mmHg 99.4 F (37.4 C) Oral 94 20 94 %  07/04/12 1600 - - - - 18 99 %  07/04/12 1341 110/58  mmHg 98.3 F (36.8 C) Axillary 94 18 99 %  07/04/12 1200 - - - - 18 99 %  07/04/12 0800 - - - - 20 92 %     Recent laboratory studies:  Recent Labs  07/03/12 0447 07/04/12 0439 07/05/12 0403  WBC 12.2* 8.1 7.3  HGB 10.2* 8.5* 8.1*  HCT 30.3* 24.2* 23.1*  PLT 240 188 185  NA 132*  --   --   K 4.1  --   --   CL 97  --   --   CO2 25  --   --   BUN 10  --   --   CREATININE 0.50  --   --   GLUCOSE 123*  --   --   CALCIUM 8.4  --   --      Discharge Medications:     Medication List    TAKE these medications       ALPRAZolam 0.5 MG tablet  Commonly known as:  XANAX  Take 0.5 mg by mouth at bedtime as needed for sleep or anxiety.     aspirin 325 MG EC tablet  Take 1 tablet (325 mg total) by mouth 2 (two) times daily.     beta carotene w/minerals tablet  Take 1 tablet by mouth daily.     ferrous sulfate 325 (65 FE) MG tablet  Take 1 tablet (325 mg total) by mouth 3 (three) times daily after  meals.     glucosamine-chondroitin 500-400 MG tablet  Take 1 tablet by mouth 2 (two) times daily.     HYDROcodone-acetaminophen 5-325 MG per tablet  Commonly known as:  NORCO/VICODIN  Take 1 tablet by mouth every 6 (six) hours as needed for pain.     levothyroxine 112 MCG tablet  Commonly known as:  SYNTHROID, LEVOTHROID  Take 112 mcg by mouth daily before breakfast. Brand name Synthroid only--pt can not take generic.  Pt will bring her synthroid     loratadine-pseudoephedrine 10-240 MG per 24 hr tablet  Commonly known as:  CLARITIN-D 24-hour  Take 1 tablet by mouth daily as needed.     multivitamin tablet  Take 1 tablet by mouth daily.     OVER THE COUNTER MEDICATION  Vitamin D 3  One daily     oxyCODONE-acetaminophen 5-325 MG per tablet  Commonly known as:  ROXICET  Take 1-2 tablets by mouth every 4 (four) hours as needed for pain.     valACYclovir 500 MG tablet  Commonly known as:  VALTREX  Take 500 mg by mouth 2 (two) times daily as needed (for genital herpes  outbreaks).     Vitamin D2 2000 UNITS Tabs  Take 1 tablet by mouth daily.        Diagnostic Studies: Dg Hip Complete Right  07/02/2012  *RADIOLOGY REPORT*  Clinical Data: Anterior right hip pain.  RIGHT HIP - COMPLETE 2+ VIEW  Comparison: MRI 10/11/2006.  Findings: Changes of right hip replacement.  Normal AP alignment. No hardware or bony complicating feature noted on these intraoperative spot images.  IMPRESSION: Right hip replacement.  No visible complicating feature.   Original Report Authenticated By: Charlett Nose, M.D.    Dg Pelvis Portable  07/02/2012  *RADIOLOGY REPORT*  Clinical Data: Postop right total hip replacement  PORTABLE PELVIS  Comparison: Intraoperative radiographs dated 07/02/2012 at 1226 hours  Findings: Right total hip arthroplasty in satisfactory position.  Associated subcutaneous gas.  No fracture or dislocation is seen.  Left hip joint space is preserved.  IMPRESSION: Right total hip arthroplasty in satisfactory position.   Original Report Authenticated By: Charline Bills, M.D.    Dg Hip Portable 1 View Right  07/02/2012  *RADIOLOGY REPORT*  Clinical Data: Postop right total hip replacement  PORTABLE RIGHT HIP - 1 VIEW  Comparison: Intraoperative radiographs dated 07/02/2012 at 1226 hours  Findings: Right total hip arthroplasty in satisfactory position on this single lateral view.  IMPRESSION: Right total hip arthroplasty in satisfactory position.   Original Report Authenticated By: Charline Bills, M.D.    Dg C-arm 61-120 Min-no Report  07/02/2012  CLINICAL DATA: right anterior hip   C-ARM 61-120 MINUTES  Fluoroscopy was utilized by the requesting physician.  No radiographic  interpretation.      Disposition:  To home      Discharge Orders   Future Orders Complete By Expires     Call MD / Call 911  As directed     Comments:      If you experience chest pain or shortness of breath, CALL 911 and be transported to the hospital emergency room.  If you develope a  fever above 101 F, pus (white drainage) or increased drainage or redness at the wound, or calf pain, call your surgeon's office.    Constipation Prevention  As directed     Comments:      Drink plenty of fluids.  Prune juice may be helpful.  You may use a  stool softener, such as Colace (over the counter) 100 mg twice a day.  Use MiraLax (over the counter) for constipation as needed.    Diet - low sodium heart healthy  As directed     Discharge instructions  As directed     Comments:      You can increase your activities as comfort allows. Wait to get your actual incision wet in the shower starting 07/07/12; then new dry dressing daily.    Discharge patient  As directed     Increase activity slowly as tolerated  As directed        Follow-up Information   Follow up with Kathryne Hitch, MD In 2 weeks.   Contact information:   [address] [place] [address] [phone]        Signed: Kathryne Hitch 07/05/2012, 7:56 AM

## 2012-07-05 NOTE — Progress Notes (Signed)
Discharge summary sent to payer through [facility]  

## 2012-07-05 NOTE — Care Management Note (Signed)
    Page 1 of 1   07/05/2012     12:00:18 PM   CARE MANAGEMENT NOTE 07/05/2012  Patient:  [name], [name]   Account Number:  [id number]  Date Initiated:  07/05/2012  Documentation initiated by:  Colleen Can  Subjective/Objective Assessment:   DX SEVERE ARTNRITIS HIP RT; TOTAL HIP REPLACEMNT-ANTERIOR REPLACEMNT     Action/Plan:   CM spoke with patient and spouse. Plans are for patient to return to her home in [facility] where spouse will be caregiver. Pt already has RW and 3n1. They are requesting [facility] for HHpt services.   Anticipated DC Date:  07/05/2012   Anticipated DC Plan:  HOME W HOME HEALTH SERVICES      DC Planning Services  CM consult      [facility] Choice  HOME HEALTH   Choice offered to / List presented to:  C-1 Patient        HH arranged  HH-2 PT      [facility] agency  [facility]   Status of service:  Completed, signed off Medicare Important Message given?  NO (If response is "NO", the following Medicare IM given date fields will be blank) Date Medicare IM given:   Date Additional Medicare IM given:    Discharge Disposition:  HOME W HOME HEALTH SERVICES  Per UR Regulation:    If discussed at Long Length of Stay Meetings, dates discussed:    Comments:  07/05/2012 Colleen Can BSN RN CCM [phone] [facility] rep notified for request of services. Advised that they can provide HHPT services with start date of tomorrow. Patient given contact information for [facility]

## 2012-07-05 NOTE — Progress Notes (Signed)
Subjective: 3 Days Post-Op Procedure(s) (LRB): RIGHT TOTAL HIP ARTHROPLASTY ANTERIOR APPROACH (Right) Patient reports pain as mild.  Asymptomatic acute blood loss anemia.  Objective: Vital signs in last 24 hours: Temp:  [98.3 F (36.8 C)-99.4 F (37.4 C)] 98.9 F (37.2 C) (04/21 0420) Pulse Rate:  [87-94] 87 (04/21 0420) Resp:  [18-20] 20 (04/21 0420) BP: (95-110)/(43-58) 97/50 mmHg (04/21 0420) SpO2:  [92 %-99 %] 93 % (04/21 0420)  Intake/Output from previous day: 04/20 0701 - 04/21 0700 In: 420 [P.O.:360; I.V.:60] Out: 575 [Urine:575] Intake/Output this shift:     Recent Labs  07/03/12 0447 07/04/12 0439 07/05/12 0403  HGB 10.2* 8.5* 8.1*    Recent Labs  07/04/12 0439 07/05/12 0403  WBC 8.1 7.3  RBC 2.69* 2.61*  HCT 24.2* 23.1*  PLT 188 185    Recent Labs  07/03/12 0447  NA 132*  K 4.1  CL 97  CO2 25  BUN 10  CREATININE 0.50  GLUCOSE 123*  CALCIUM 8.4   No results found for this basename: LABPT, INR,  in the last 72 hours  Sensation intact distally Intact pulses distally Dorsiflexion/Plantar flexion intact Incision: dressing C/D/I No cellulitis present  Assessment/Plan: 3 Days Post-Op Procedure(s) (LRB): RIGHT TOTAL HIP ARTHROPLASTY ANTERIOR APPROACH (Right) Dsicharge to home today.  Addilynn Mowrer Y 07/05/2012, 7:52 AM

## 2012-07-05 NOTE — Progress Notes (Signed)
Physical Therapy Treatment Patient Details Name: [name] MRN: [id number] DOB: Feb 03, 1946 Today's Date: 07/05/2012 Time: 4098-1191 PT Time Calculation (min): 26 min  PT Assessment / Plan / Recommendation Comments on Treatment Session  Pt planning to d/c today. Practiced ambulation, exercises. All education complete. Recommend HHPT. Pt ready to d/c from PT standpoint.     Follow Up Recommendations  Home health PT     Does the patient have the potential to tolerate intense rehabilitation     Barriers to Discharge        Equipment Recommendations  None recommended by PT    Recommendations for Other Services    Frequency 7X/week   Plan Discharge plan remains appropriate    Precautions / Restrictions Precautions Precautions: None Restrictions Weight Bearing Restrictions: No RLE Weight Bearing: Weight bearing as tolerated   Pertinent Vitals/Pain 2/10 r hip     Mobility  Bed Mobility Bed Mobility: Supine to Sit;Sit to Supine Supine to Sit: 5: Supervision Sit to Supine: 5: Supervision Transfers Transfers: Sit to Stand;Stand to Sit Sit to Stand: 5: Supervision Stand to Sit: 5: Supervision Details for Transfer Assistance: VCS hand placement Ambulation/Gait Ambulation/Gait Assistance: 5: Supervision Ambulation Distance (Feet): 500 Feet Assistive device: Rolling walker Ambulation/Gait Assistance Details: Cues for posture. Still having some difficulty fully extending trunk and R hip during swing through phase. Slow gait speed. Gait appears somewhat antalgic.  Gait Pattern: Step-to pattern;Step-through pattern;Decreased stride length;Decreased step length - right;Trunk flexed Stairs: No    Exercises Total Joint Exercises Ankle Circles/Pumps: AROM;Both;20 reps;Supine Quad Sets: AROM;Both;15 reps;Supine Short Arc Quad: AROM;Right;Supine;15 reps Heel Slides: AROM;Right;15 reps;Supine Hip ABduction/ADduction: AROM;Right;15 reps;Supine   PT Diagnosis:    PT Problem List:    PT Treatment Interventions:     PT Goals Acute Rehab PT Goals Pt will go Supine/Side to Sit: with supervision PT Goal: Supine/Side to Sit - Progress: Met Pt will go Sit to Supine/Side: with supervision PT Goal: Sit to Supine/Side - Progress: Met Pt will go Sit to Stand: with supervision PT Goal: Sit to Stand - Progress: Progressing toward goal Pt will go Stand to Sit: with supervision PT Goal: Stand to Sit - Progress: Progressing toward goal Pt will Ambulate: 51 - 150 feet;with least restrictive assistive device;with modified independence PT Goal: Ambulate - Progress: Progressing toward goal Pt will Perform Home Exercise Program: with supervision, verbal cues required/provided PT Goal: Perform Home Exercise Program - Progress: Progressing toward goal  Visit Information  Last PT Received On: 07/05/12 Assistance Needed: +1    Subjective Data  Subjective: I just need help standing up straight Patient Stated Goal: to return home.    Cognition  Cognition Arousal/Alertness: Awake/alert Behavior During Therapy: WFL for tasks assessed/performed Overall Cognitive Status: Within Functional Limits for tasks assessed    Balance     End of Session PT - End of Session Activity Tolerance: Patient tolerated treatment well Patient left: in bed;with call bell/phone within reach;with family/visitor present   GP     Rebeca Alert, MPT Pager: [phone]

## 2012-07-06 MED ORDER — CLINDAMYCIN PHOSPHATE 600 MG/50ML IV SOLN
INTRAVENOUS | Status: DC | PRN
  Administered 2012-07-02: 600 mg via INTRAVENOUS

## 2012-07-06 MED FILL — Clindamycin Phosphate IV Soln 900 MG/6ML: INTRAVENOUS | Qty: 6 | Status: AC

## 2012-07-06 NOTE — Addendum Note (Signed)
Addendum created 07/06/12 1456 by Lattie Haw, CRNA   Modules edited: Anesthesia Medication Administration

## 2012-07-08 ENCOUNTER — Other Ambulatory Visit: Payer: Self-pay

## 2012-07-08 MED ORDER — LEVOTHYROXINE SODIUM 112 MCG PO TABS: 112.0000 ug | ORAL_TABLET | Freq: Every day | ORAL | Status: DC

## 2012-07-09 ENCOUNTER — Ambulatory Visit (HOSPITAL_COMMUNITY)
Admission: RE | Admit: 2012-07-09 | Discharge: 2012-07-09 | Disposition: A | Discharge status: Home / Self Care | Payer: PRIVATE HEALTH INSURANCE | Source: Ambulatory Visit | Attending: Orthopaedic Surgery | Admitting: Orthopaedic Surgery

## 2012-07-09 ENCOUNTER — Other Ambulatory Visit (HOSPITAL_COMMUNITY): Payer: Self-pay | Admitting: Orthopaedic Surgery

## 2012-07-09 DIAGNOSIS — M25561 Pain in right knee: Secondary | ICD-10-CM

## 2012-07-09 DIAGNOSIS — M7989 Other specified soft tissue disorders: Secondary | ICD-10-CM

## 2012-07-09 NOTE — Progress Notes (Signed)
VASCULAR LAB PRELIMINARY  PRELIMINARY  PRELIMINARY  PRELIMINARY  Right lower extremity venous duplex completed.    Preliminary report:  Right:  No evidence of DVT, superficial thrombosis, or Baker's cyst.  [name], RVS 07/09/2012, 11:30 AM

## 2012-07-20 ENCOUNTER — Telehealth: Payer: Self-pay | Admitting: Internal Medicine

## 2012-07-20 NOTE — Telephone Encounter (Signed)
Patient wants thyroid functions checked. These were last checked February 2014 and TSH was within normal limits. Patient just had hip replacement surgery. Suggest she have these repeated in August unless she is feeling significantly worse. Finances suggested patient had thyroid functions checked during hospitalization for hip replacement but I do not see these results an Epic

## 2012-07-20 NOTE — Telephone Encounter (Signed)
Patient advised. States she feels washed out after her surgery. Hgb was 8.0mg . She is taking an iron supplement and eating iron rich foods.

## 2012-08-10 ENCOUNTER — Other Ambulatory Visit: Payer: Self-pay | Admitting: Internal Medicine

## 2012-08-10 ENCOUNTER — Other Ambulatory Visit: Payer: Self-pay

## 2012-08-24 ENCOUNTER — Other Ambulatory Visit: Payer: Self-pay

## 2012-08-24 DIAGNOSIS — E038 Other specified hypothyroidism: Secondary | ICD-10-CM

## 2012-08-27 ENCOUNTER — Other Ambulatory Visit: Payer: Self-pay | Admitting: Internal Medicine

## 2012-08-27 ENCOUNTER — Other Ambulatory Visit: Payer: PRIVATE HEALTH INSURANCE | Admitting: Internal Medicine

## 2012-08-27 DIAGNOSIS — E039 Hypothyroidism, unspecified: Secondary | ICD-10-CM

## 2012-08-27 DIAGNOSIS — D649 Anemia, unspecified: Secondary | ICD-10-CM

## 2012-08-27 LAB — HEMOGLOBIN: Hemoglobin: 13.7 g/dL (ref 12.0–15.0)

## 2012-08-27 LAB — TSH: TSH: 0.396 u[IU]/mL (ref 0.350–4.500)

## 2012-10-08 ENCOUNTER — Other Ambulatory Visit: Payer: Self-pay

## 2012-10-08 MED ORDER — SYNTHROID 112 MCG PO TABS: 112.0000 ug | ORAL_TABLET | Freq: Every day | ORAL | Status: DC

## 2013-03-01 ENCOUNTER — Other Ambulatory Visit: Payer: PRIVATE HEALTH INSURANCE | Admitting: Internal Medicine

## 2013-03-01 DIAGNOSIS — Z0000 Encounter for general adult medical examination without abnormal findings: Secondary | ICD-10-CM

## 2013-03-01 DIAGNOSIS — E039 Hypothyroidism, unspecified: Secondary | ICD-10-CM

## 2013-03-01 DIAGNOSIS — Z13220 Encounter for screening for lipoid disorders: Secondary | ICD-10-CM

## 2013-03-01 DIAGNOSIS — Z130 Encounter for screening for diseases of the blood and blood-forming organs and certain disorders involving the immune mechanism: Secondary | ICD-10-CM

## 2013-03-01 LAB — CBC WITH DIFFERENTIAL/PLATELET
Basophils Relative: 1 % (ref 0–1)
HCT: 42 % (ref 36.0–46.0)
Hemoglobin: 14.4 g/dL (ref 12.0–15.0)
Lymphocytes Relative: 32 % (ref 12–46)
Lymphs Abs: 1.6 10*3/uL (ref 0.7–4.0)
Monocytes Absolute: 0.4 10*3/uL (ref 0.1–1.0)
Monocytes Relative: 9 % (ref 3–12)
Neutro Abs: 2.7 10*3/uL (ref 1.7–7.7)
Neutrophils Relative %: 54 % (ref 43–77)
RBC: 4.74 MIL/uL (ref 3.87–5.11)
WBC: 5 10*3/uL (ref 4.0–10.5)

## 2013-03-01 LAB — COMPREHENSIVE METABOLIC PANEL
ALT: 17 U/L (ref 0–35)
Albumin: 4.1 g/dL (ref 3.5–5.2)
Alkaline Phosphatase: 67 U/L (ref 39–117)
Glucose, Bld: 87 mg/dL (ref 70–99)
Potassium: 4.4 mEq/L (ref 3.5–5.3)
Sodium: 142 mEq/L (ref 135–145)
Total Bilirubin: 0.9 mg/dL (ref 0.3–1.2)
Total Protein: 6.4 g/dL (ref 6.0–8.3)

## 2013-03-01 LAB — LIPID PANEL
LDL Cholesterol: 94 mg/dL (ref 0–99)
Triglycerides: 84 mg/dL (ref <150)
VLDL: 17 mg/dL (ref 0–40)

## 2013-03-02 LAB — VITAMIN D 25 HYDROXY (VIT D DEFICIENCY, FRACTURES): Vit D, 25-Hydroxy: 55 ng/mL (ref 30–89)

## 2013-03-02 LAB — TSH: TSH: 1.559 u[IU]/mL (ref 0.350–4.500)

## 2013-03-03 ENCOUNTER — Ambulatory Visit (INDEPENDENT_AMBULATORY_CARE_PROVIDER_SITE_OTHER): Payer: PRIVATE HEALTH INSURANCE | Admitting: Internal Medicine

## 2013-03-03 ENCOUNTER — Encounter: Payer: Self-pay | Admitting: Internal Medicine

## 2013-03-03 VITALS — BP 130/82 | HR 64 | Temp 97.7°F | Ht 63.5 in | Wt 156.0 lb

## 2013-03-03 DIAGNOSIS — G5790 Unspecified mononeuropathy of unspecified lower limb: Secondary | ICD-10-CM

## 2013-03-03 DIAGNOSIS — F411 Generalized anxiety disorder: Secondary | ICD-10-CM | POA: Diagnosis not present

## 2013-03-03 DIAGNOSIS — J309 Allergic rhinitis, unspecified: Secondary | ICD-10-CM

## 2013-03-03 DIAGNOSIS — E039 Hypothyroidism, unspecified: Secondary | ICD-10-CM

## 2013-03-03 DIAGNOSIS — Z0000 Encounter for general adult medical examination without abnormal findings: Secondary | ICD-10-CM

## 2013-03-03 DIAGNOSIS — B009 Herpesviral infection, unspecified: Secondary | ICD-10-CM | POA: Diagnosis not present

## 2013-03-03 DIAGNOSIS — K219 Gastro-esophageal reflux disease without esophagitis: Secondary | ICD-10-CM

## 2013-03-03 DIAGNOSIS — G5791 Unspecified mononeuropathy of right lower limb: Secondary | ICD-10-CM

## 2013-03-03 LAB — POCT URINALYSIS DIPSTICK
Bilirubin, UA: NEGATIVE
Blood, UA: NEGATIVE
Glucose, UA: NEGATIVE
Leukocytes, UA: NEGATIVE
Nitrite, UA: NEGATIVE
Urobilinogen, UA: 0.2

## 2013-03-03 MED ORDER — ALPRAZOLAM 0.5 MG PO TABS: 0.5000 mg | ORAL_TABLET | Freq: Every evening | ORAL | Status: DC | PRN

## 2013-04-19 ENCOUNTER — Other Ambulatory Visit: Payer: Self-pay | Admitting: Internal Medicine

## 2013-06-03 ENCOUNTER — Encounter: Payer: Self-pay | Admitting: Internal Medicine

## 2013-06-03 ENCOUNTER — Ambulatory Visit (INDEPENDENT_AMBULATORY_CARE_PROVIDER_SITE_OTHER): Payer: PRIVATE HEALTH INSURANCE | Admitting: Internal Medicine

## 2013-06-03 VITALS — BP 146/86 | HR 68 | Temp 98.5°F | Wt 160.0 lb

## 2013-06-03 DIAGNOSIS — J019 Acute sinusitis, unspecified: Secondary | ICD-10-CM

## 2013-06-03 MED ORDER — LEVOFLOXACIN 500 MG PO TABS: 500.0000 mg | ORAL_TABLET | Freq: Every day | ORAL | Status: DC

## 2013-06-03 NOTE — Progress Notes (Signed)
   Subjective:    Patient ID: [name], female    DOB: 1945-10-10, 68 y.o.   MRN: [id number]  HPI One-week history of URI symptoms. Started out a sore throat and laryngitis and now has right maxillary sinus tenderness. No fever or shaking chills. Just doesn't feel well. Has been taking Sudafed without much relief.    Review of Systems     Objective:   Physical Exam Pharynx is slightly injected without exudate. TMs are clear. Neck is supple without adenopathy. Chest clear to auscultation.       Assessment & Plan:  Acute sinusitis  Plan: Levaquin 500 milligrams daily for 10 days with one refill. She does not want to take steroids because she doesn't do well with him. Did offer to give her Depo-Medrol IM for sinus congestion but she declined.

## 2013-06-03 NOTE — Patient Instructions (Signed)
Take Levaquin as directed for 10 days. Take Sudafed for congestion

## 2013-06-21 DIAGNOSIS — Z0289 Encounter for other administrative examinations: Secondary | ICD-10-CM

## 2013-08-21 NOTE — Progress Notes (Signed)
Subjective:    Patient ID: [name], female    DOB: 08-Aug-1945, 68 y.o.   MRN: [id number]  HPI  68 year old white female in today for health maintenance exam and evaluation of medical issues. History of hypothyroidism status post right hip replacement, GE reflux, anxiety and allergic rhinitis.  Past medical history: Left breast biopsy which was benign in 1972. Lumbar disc surgery at L4 1999. Lumbar disc surgery at L5 1981. Cholecystectomy 2004. Colonoscopy by Dr. Olevia Perches November 2007 and 10 year followup recommended.  History of herpes simplex type II.  GYN exam is done by [facility] in [facility].  She is allergic to penicillin causes anaphylaxis.  She has a health care power of attorney.  Social history: Patient is married to [employer] who operates [employer] in [place]. They reside in [facility]. She has 2 adult daughters. Patient does not smoke. Social alcohol consumption. 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  Family history father died at age 20 of an MI. Mother with history of neurological problems and heart disease. One brother died at age 64 of congestive heart failure secondary to rheumatic fever complications. One brother in good health. One sister with hypertension and obesity. Another sister with hypertension and obesity.  Right hip replacement surgery by Dr. Ninfa Linden April 2014.   Review of Systems  Constitutional: Negative.   HENT: Negative.   Eyes: Negative.   Respiratory: Negative.   Cardiovascular: Negative.   Gastrointestinal: Negative.   Endocrine:       Long-standing history of hypothyroidism  Genitourinary: Negative.   Allergic/Immunologic: Positive for environmental allergies.  Neurological: Negative.   Hematological: Negative.   Psychiatric/Behavioral:       History of mild anxiety       Objective:   Physical Exam  Vitals reviewed. Constitutional: She is oriented to person, place, and time.  She appears well-developed and well-nourished. No distress.  HENT:  Head: Normocephalic and atraumatic.  Right Ear: External ear normal.  Left Ear: External ear normal.  Mouth/Throat: Oropharynx is clear and moist. No oropharyngeal exudate.  Eyes: Conjunctivae and EOM are normal. Right eye exhibits no discharge. Left eye exhibits no discharge. No scleral icterus.  Neck: Neck supple. No JVD present. No thyromegaly present.  Cardiovascular: Normal rate, regular rhythm, normal heart sounds and intact distal pulses.   No murmur heard. Pulmonary/Chest: Effort normal and breath sounds normal. No respiratory distress. She has no wheezes. She has no rales.  Breasts normal female  Abdominal: Soft. Bowel sounds are normal. She exhibits no distension and no mass. There is no tenderness. There is no rebound and no guarding.  Genitourinary:  Deferred to GYN  Musculoskeletal: Normal range of motion. She exhibits no edema.  Lymphadenopathy:    She has no cervical adenopathy.  Neurological: She is alert and oriented to person, place, and time. She has normal reflexes. No cranial nerve deficit. Coordination normal.  Skin: Skin is warm and dry. She is not diaphoretic. No erythema.  Psychiatric: She has a normal mood and affect. Her behavior is normal. Judgment and thought content normal.          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 Immunizations are up-to-date. Patient  had Pneumovax immunization April 2012. Recommend bone density study every 2 years and annual mammogram. Colonoscopy due 2017.   Subjective:   Patient presents for Medicare Annual/Subsequent preventive examination.  Review Past Medical/Family/Social:   Risk Factors  Current exercise  habits: walks alot Dietary issues discussed: low fat low carb  Cardiac risk factors: Mother with history of heart disease  Depression Screen  (Note: if answer to either of the following is "Yes", a more complete depression screening is indicated)   Over the past two weeks, have you felt down, depressed or hopeless? No  Over the past two weeks, have you felt little interest or pleasure in doing things? No Have you lost interest or pleasure in daily life? No Do you often feel hopeless? No Do you cry easily over simple problems? No   Activities of Daily Living  In your present state of health, do you have any difficulty performing the following activities?:   Driving? No  Managing money? No  Feeding yourself? No  Getting from bed to chair? No  Climbing a flight of stairs? No  Preparing food and eating?: No  Bathing or showering? No  Getting dressed: No  Getting to the toilet? No  Using the toilet:No  Moving around from place to place: No  In the past year have you fallen or had a near fall?:No  Are you sexually active? No  Do you have more than one partner? No   Hearing Difficulties: No  Do you often ask people to speak up or repeat themselves? No  Do you experience ringing or noises in your ears? No  Do you have difficulty understanding soft or whispered voices? No  Do you feel that you have a problem with memory? No Do you often misplace items? No    Home Safety:  Do you have a smoke alarm at your residence? Yes Do you have grab bars in the bathroom?no Do you have throw rugs in your house? yes   Cognitive Testing  Alert? Yes Normal Appearance?Yes  Oriented to person? Yes Place? Yes  Time? Yes  Recall of three objects? Yes  Can perform simple calculations? Yes  Displays appropriate judgment?Yes  Can read the correct time from a watch face?Yes   List the Names of Other Physician/Practitioners you currently use:  See referral list for the physicians patient is  currently seeing.  OB/GYN   Review of Systems:   Objective:     General appearance: Appears younger than stated age Head: Normocephalic, without obvious abnormality, atraumatic  Eyes: conj clear, EOMi PEERLA  Ears: normal TM's and external ear canals both ears  Nose: Nares normal. Septum midline. Mucosa normal. No drainage or sinus tenderness.  Throat: lips, mucosa, and tongue normal; teeth and gums normal  Neck: no adenopathy, no carotid bruit, no JVD, supple, symmetrical, trachea midline and thyroid not enlarged, symmetric, no tenderness/mass/nodules  No CVA tenderness.  Lungs: clear to auscultation bilaterally  Breasts: normal appearance, no masses or tenderness Heart: regular rate and rhythm, S1, S2 normal, no murmur, click, rub or gallop  Abdomen: soft, non-tender; bowel sounds normal; no masses, no organomegaly  Musculoskeletal: ROM normal in all joints, no crepitus, no deformity, Normal muscle strengthen. Back  is symmetric, no curvature. Skin: Skin color, texture, turgor normal. No rashes or lesions  Lymph nodes: Cervical, supraclavicular, and axillary nodes normal.  Neurologic: CN 2 -12 Normal, Normal symmetric reflexes. Normal coordination and gait  Psych: Alert & Oriented x 3, Mood appear stable.  Assessment:    Annual wellness medicare exam   Plan:    During the course of the visit the patient was educated and counseled about appropriate screening and preventive services including:   Annual mammogram  Bone density study every 2 years colonoscopy due 2017     Patient Instructions (the written plan) was given to the patient.  Medicare Attestation  I have personally reviewed:  The patient's medical and social history  Their use of alcohol, tobacco or illicit drugs  Their current medications and supplements  The patient's functional ability including ADLs,fall risks, home safety risks, cognitive, and hearing and visual impairment  Diet and physical  activities  Evidence for depression or mood disorders  The patient's weight, height, BMI, and visual acuity have been recorded in the chart. I have made referrals, counseling, and provided education to the patient based on review of the above and I have provided the patient with a written personalized care plan for preventive services.

## 2013-08-21 NOTE — Patient Instructions (Addendum)
And year or as needed. Continue same medications. TSH is  normal on current dose of thyroid replacement therapy. Recommend annual mammogram and bone density study every 2 years

## 2013-12-20 ENCOUNTER — Ambulatory Visit: Payer: Self-pay | Admitting: Internal Medicine

## 2013-12-26 ENCOUNTER — Encounter: Payer: Self-pay | Admitting: Internal Medicine

## 2013-12-26 ENCOUNTER — Ambulatory Visit (INDEPENDENT_AMBULATORY_CARE_PROVIDER_SITE_OTHER): Payer: PRIVATE HEALTH INSURANCE | Admitting: Internal Medicine

## 2013-12-26 VITALS — BP 122/78 | HR 72 | Ht 64.5 in | Wt 155.0 lb

## 2013-12-26 DIAGNOSIS — F329 Major depressive disorder, single episode, unspecified: Secondary | ICD-10-CM

## 2013-12-26 DIAGNOSIS — Z23 Encounter for immunization: Secondary | ICD-10-CM | POA: Diagnosis not present

## 2013-12-26 DIAGNOSIS — F418 Other specified anxiety disorders: Secondary | ICD-10-CM

## 2013-12-26 DIAGNOSIS — Z630 Problems in relationship with spouse or partner: Secondary | ICD-10-CM | POA: Diagnosis not present

## 2013-12-26 DIAGNOSIS — F32A Depression, unspecified: Secondary | ICD-10-CM

## 2013-12-26 DIAGNOSIS — F419 Anxiety disorder, unspecified: Secondary | ICD-10-CM

## 2013-12-26 MED ORDER — BUPROPION HCL ER (XL) 150 MG PO TB24: 150.0000 mg | ORAL_TABLET | Freq: Every day | ORAL | Status: DC

## 2014-01-02 ENCOUNTER — Other Ambulatory Visit: Payer: Self-pay | Admitting: Internal Medicine

## 2014-01-25 NOTE — Patient Instructions (Signed)
Start Wellbutrin XL 150 mg daily. Takes Xanax sparingly for anxiety. Return in 4 weeks.

## 2014-01-25 NOTE — Progress Notes (Signed)
   Subjective:    Patient ID: [name], female    DOB: 02-10-1946, 68 y.o.   MRN: [id number]  HPI  Patient is under some stress with her marriage. Apparently husband has reconnected with an old girlfriend and this is call some issues. They're building a home in [place] and old girlfriend lives there.  Patient was not aware of this until recently.husband has been texting old  girlfriend. They are now in marriage counseling.Patient feels they're making some progress.[place] this is causing depression symptoms and patient.    Review of Systems     Objective:   Physical Exam  She is alert and oriented. She speaks appropriately. Has appropriate concerns. Spent 25 minutes with her about these issues.      Assessment & Plan:  Marital stress  Depression  Plan:start Wellbutrin XL 150 mg daily. Takes Xanax sparingly for anxiety. Return in 4-6 weeks.

## 2014-01-30 ENCOUNTER — Ambulatory Visit (INDEPENDENT_AMBULATORY_CARE_PROVIDER_SITE_OTHER): Payer: PRIVATE HEALTH INSURANCE | Admitting: Internal Medicine

## 2014-01-30 ENCOUNTER — Encounter: Payer: Self-pay | Admitting: Internal Medicine

## 2014-01-30 VITALS — BP 122/82 | HR 86 | Temp 97.4°F | Wt 160.0 lb

## 2014-01-30 DIAGNOSIS — F418 Other specified anxiety disorders: Secondary | ICD-10-CM

## 2014-01-30 DIAGNOSIS — E039 Hypothyroidism, unspecified: Secondary | ICD-10-CM

## 2014-01-30 DIAGNOSIS — F419 Anxiety disorder, unspecified: Principal | ICD-10-CM

## 2014-01-30 DIAGNOSIS — F329 Major depressive disorder, single episode, unspecified: Secondary | ICD-10-CM

## 2014-02-12 NOTE — Patient Instructions (Signed)
Continue same medications. Return in March for physical exam.

## 2014-02-12 NOTE — Progress Notes (Signed)
   Subjective:    Patient ID: [name], female    DOB: 03/13/1946, 68 y.o.   MRN: [id number]  HPI Here today to follow-up on anxiety and depression related to marital issues and stress. She had her husband continue to go to marital counseling and that is helping. They haven't decided if they want to move to [facility] or not. Their house will be completed soon. She is ambivalent about it. Doing well on antidepressant with no side effects and is feeling calmer.    Review of Systems     Objective:   Physical Exam  Spent 25 minutes speaking with patient about these issues      Assessment & Plan:  Anxiety depression-continue Wellbutrin and takes Xanax sparingly as needed   History of hypothyroidism  Plan: Continue same medications and return in March for physical exam.

## 2014-03-23 ENCOUNTER — Telehealth: Payer: Self-pay | Admitting: Internal Medicine

## 2014-03-23 NOTE — Telephone Encounter (Signed)
She and husband want Zostavax vaccine. Have provided them with written Rxs before. They cannot find Rxs.  Mailed to pt and husband again today.

## 2014-04-04 ENCOUNTER — Other Ambulatory Visit: Payer: Self-pay | Admitting: Internal Medicine

## 2014-05-23 ENCOUNTER — Other Ambulatory Visit: Payer: Self-pay | Admitting: *Deleted

## 2014-05-23 MED ORDER — VALACYCLOVIR HCL 500 MG PO TABS: 500.0000 mg | ORAL_TABLET | Freq: Every day | ORAL | Status: DC

## 2014-05-31 DIAGNOSIS — Z01419 Encounter for gynecological examination (general) (routine) without abnormal findings: Secondary | ICD-10-CM | POA: Diagnosis not present

## 2014-05-31 DIAGNOSIS — Z1231 Encounter for screening mammogram for malignant neoplasm of breast: Secondary | ICD-10-CM | POA: Diagnosis not present

## 2014-05-31 DIAGNOSIS — B009 Herpesviral infection, unspecified: Secondary | ICD-10-CM | POA: Diagnosis not present

## 2014-05-31 IMAGING — CR DG HIP 1V PORT*R*
1 series · 2 of 2 positions shown · non-contrast
Comparison: Intraoperative radiographs dated 07/02/2012 at 7338
hours

CLINICAL DATA: Postop right total hip replacement

PORTABLE RIGHT HIP - 1 VIEW

[Series 1: AP · right · 2 of 2 slices shown]
[im 1/2]
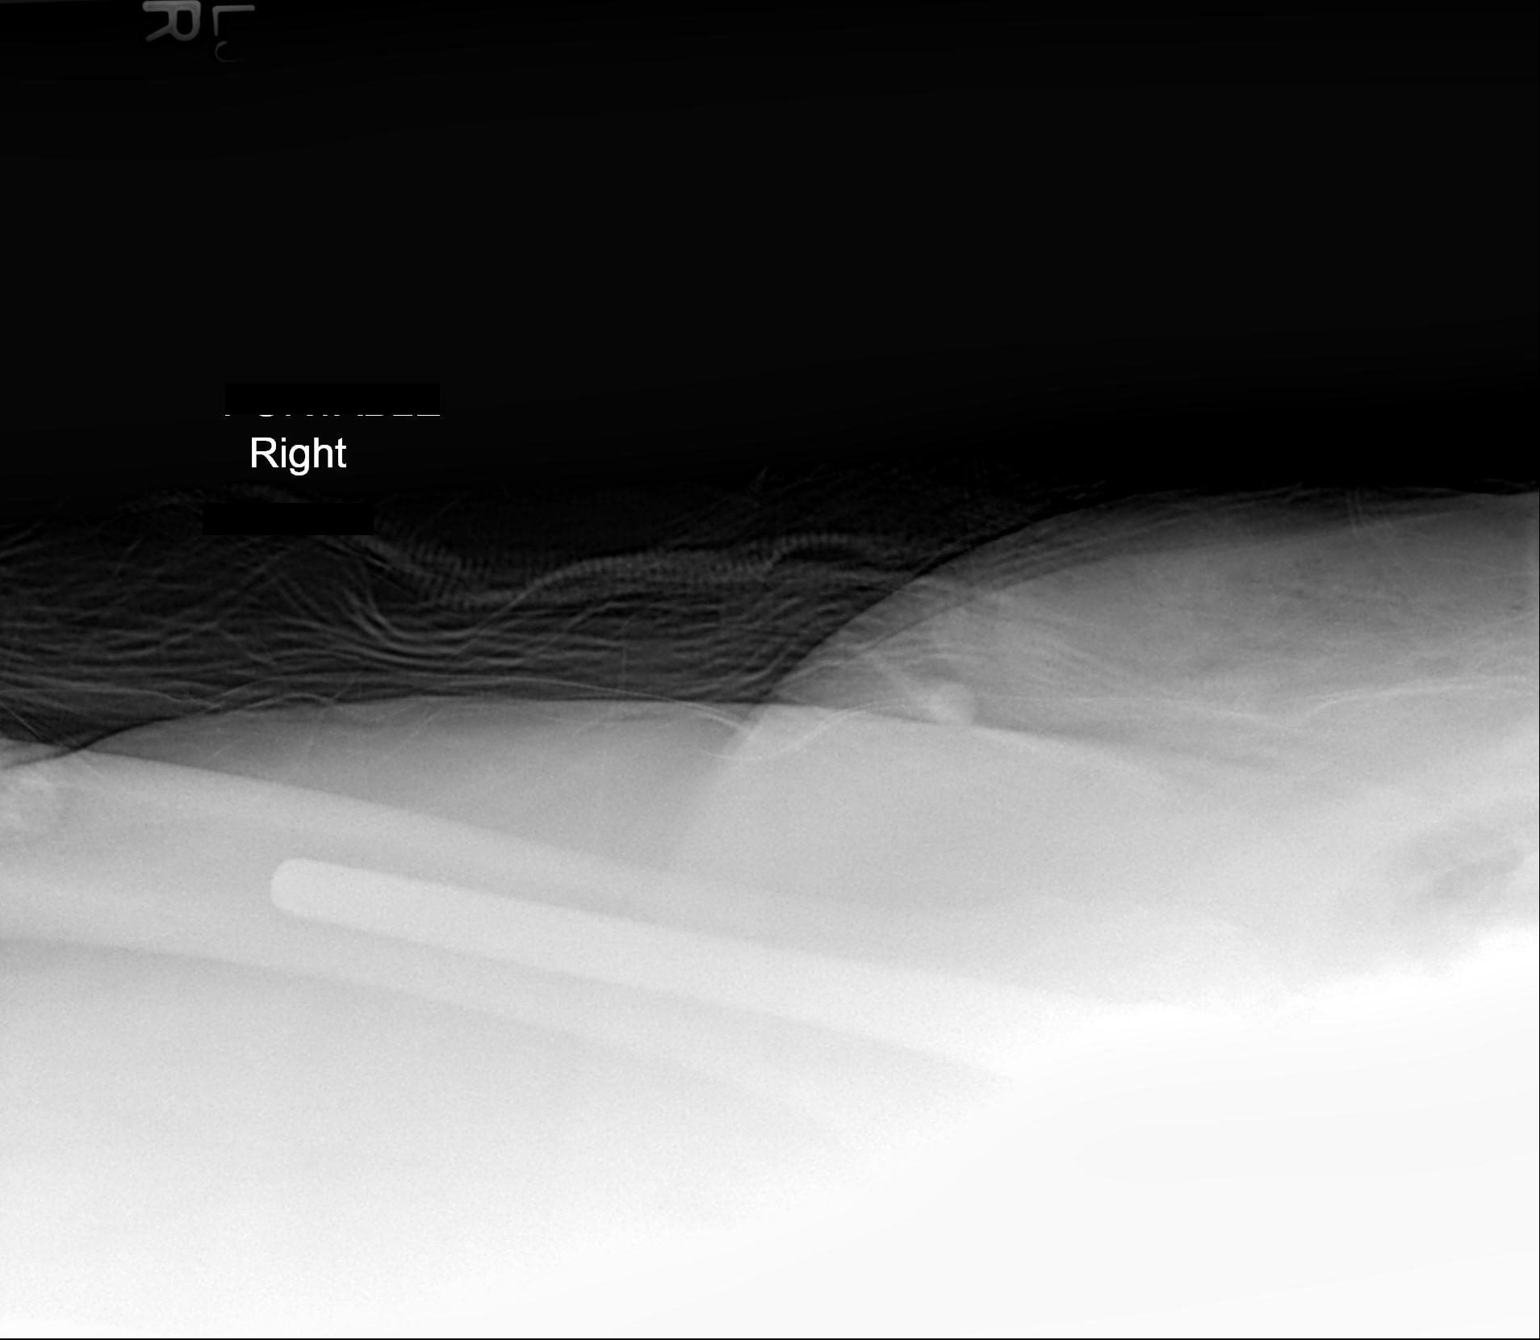
[im 2/2]
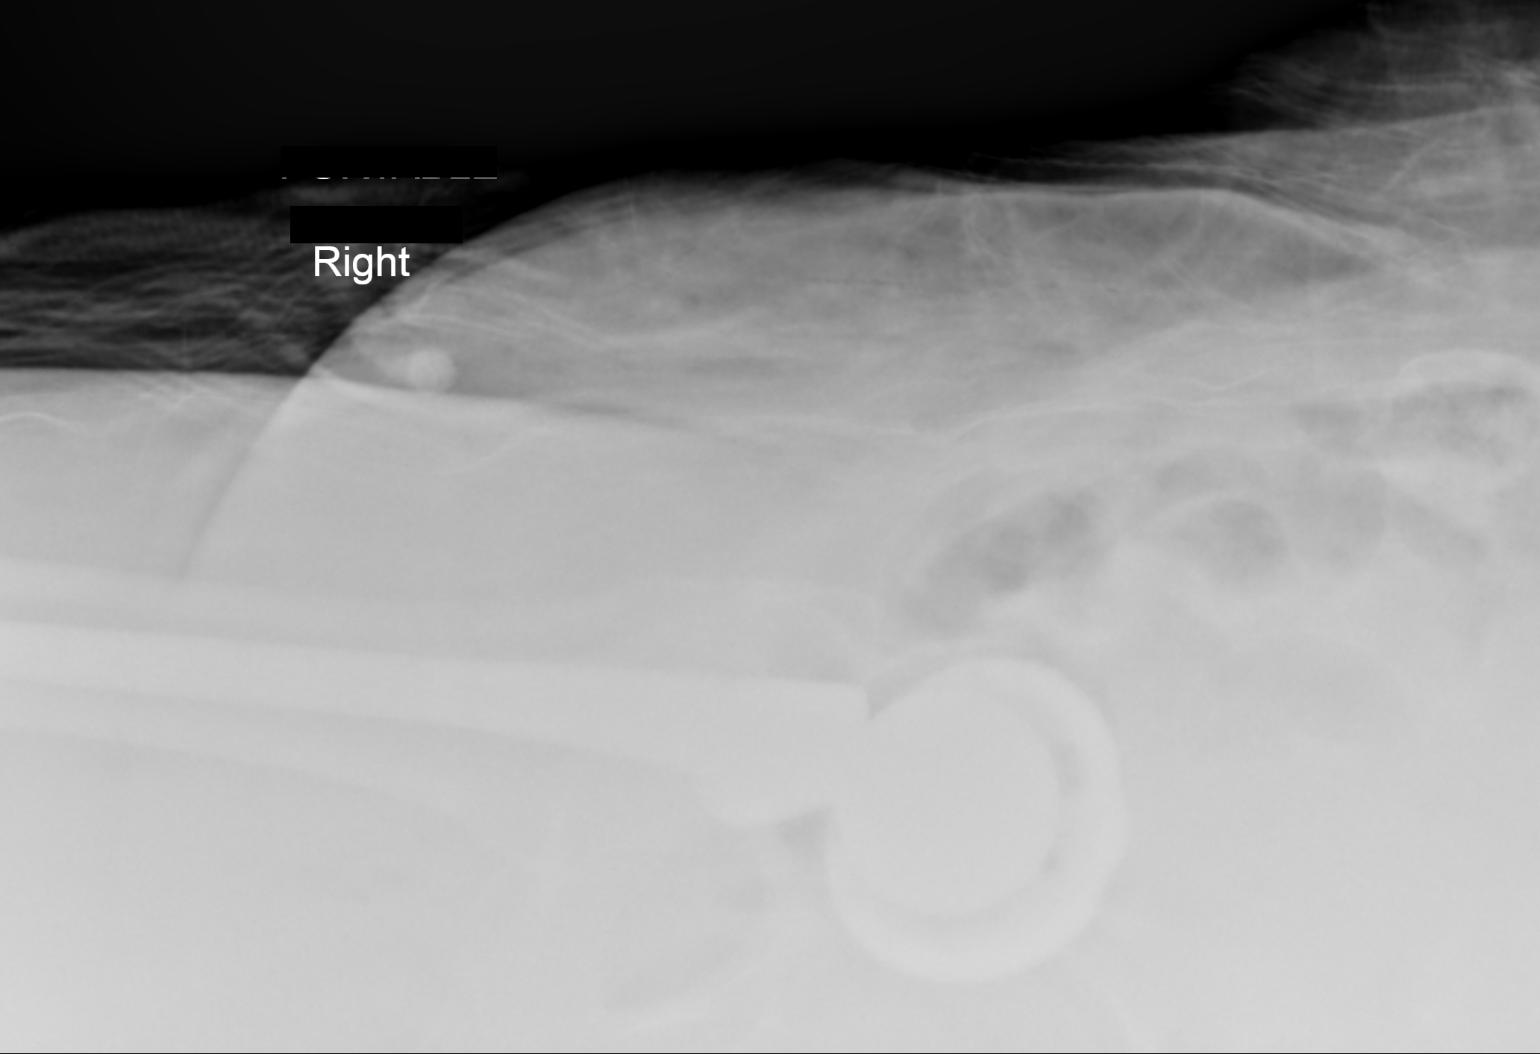

[2 of 2 positions shown; findings below may reference images not displayed]

FINDINGS: Right total hip arthroplasty in satisfactory position on
this single lateral view.
IMPRESSION: Right total hip arthroplasty in satisfactory position.

## 2014-05-31 IMAGING — CR DG PORTABLE PELVIS
1 series · 1 of 1 positions shown · non-contrast
Comparison: Intraoperative radiographs dated 07/02/2012 at 0881
hours

CLINICAL DATA: Postop right total hip replacement

PORTABLE PELVIS

[AP]
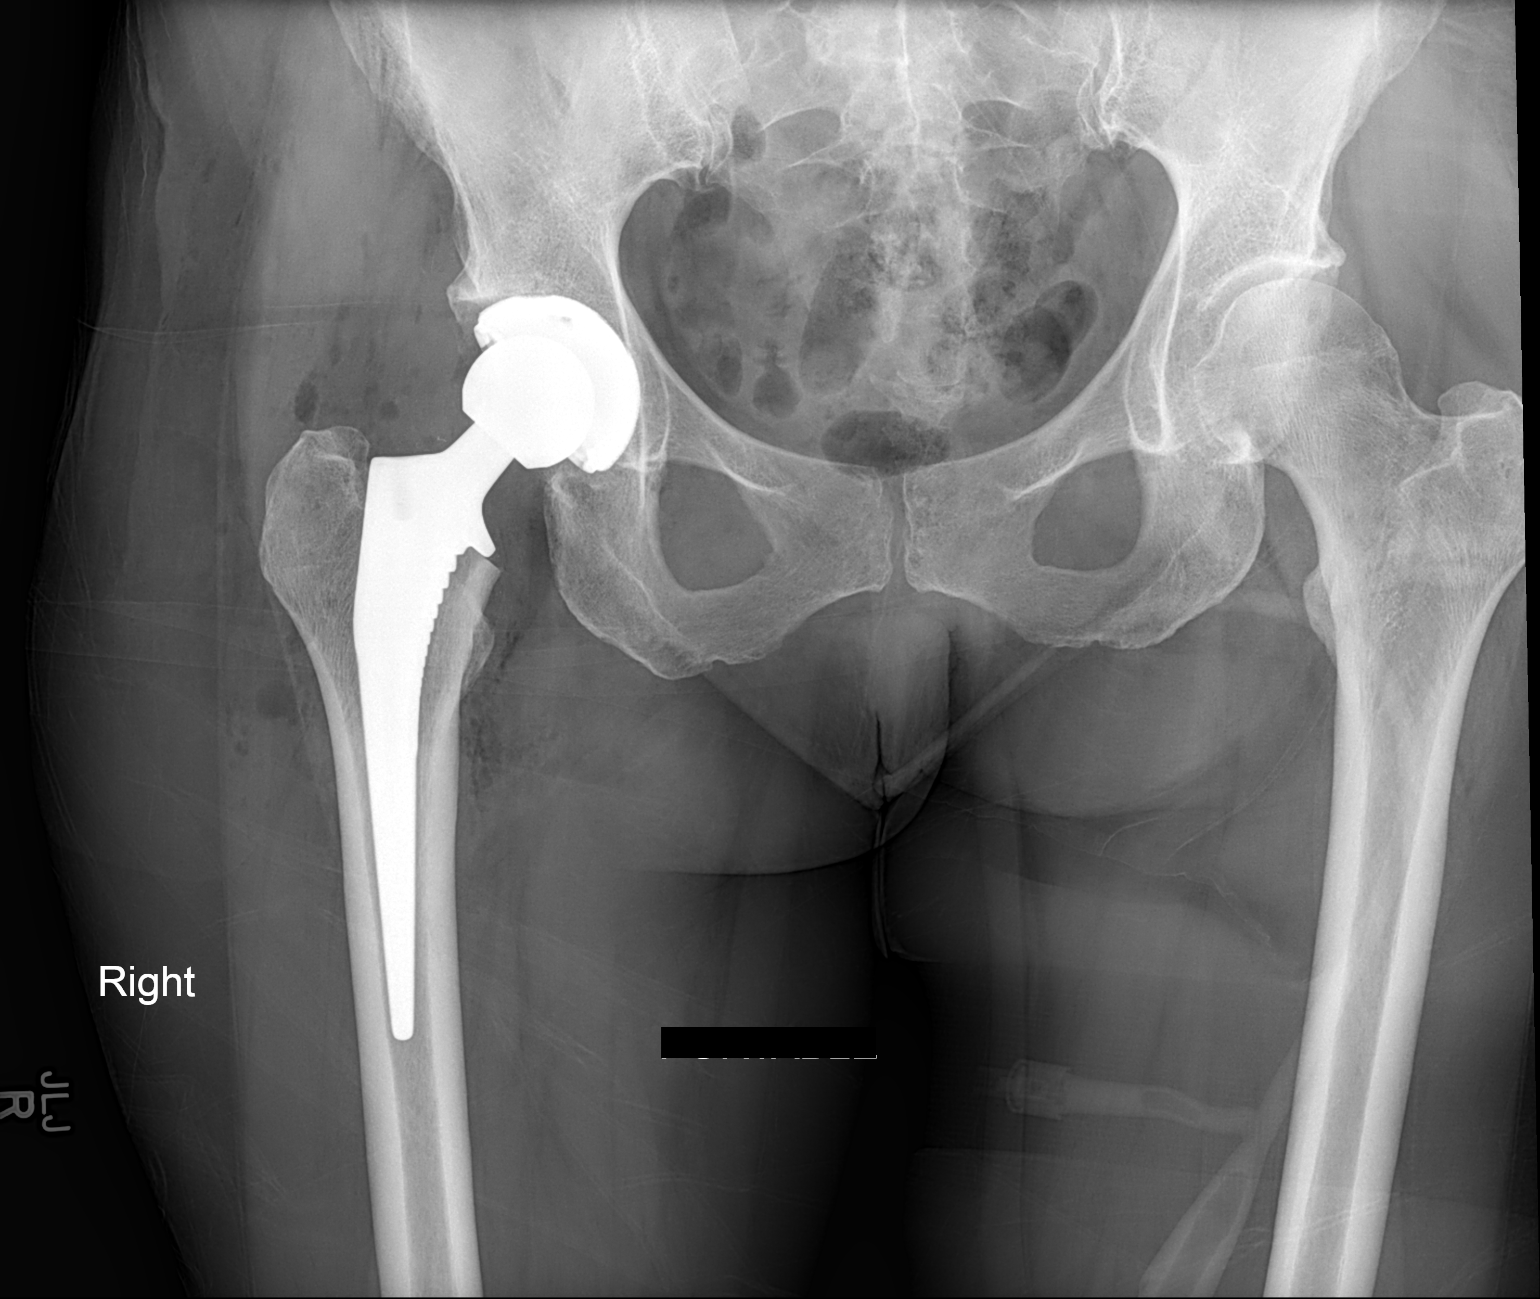

[1 of 1 positions shown; findings below may reference images not displayed]

FINDINGS: Right total hip arthroplasty in satisfactory position.

Associated subcutaneous gas.

No fracture or dislocation is seen.

Left hip joint space is preserved.
IMPRESSION: Right total hip arthroplasty in satisfactory position.

## 2014-06-05 ENCOUNTER — Other Ambulatory Visit: Payer: PRIVATE HEALTH INSURANCE | Admitting: Internal Medicine

## 2014-06-06 ENCOUNTER — Encounter: Payer: PRIVATE HEALTH INSURANCE | Admitting: Internal Medicine

## 2014-06-19 ENCOUNTER — Other Ambulatory Visit: Payer: Medicare Other | Admitting: Internal Medicine

## 2014-06-19 DIAGNOSIS — M179 Osteoarthritis of knee, unspecified: Secondary | ICD-10-CM | POA: Diagnosis not present

## 2014-06-19 DIAGNOSIS — Z79899 Other long term (current) drug therapy: Secondary | ICD-10-CM

## 2014-06-19 DIAGNOSIS — R5383 Other fatigue: Secondary | ICD-10-CM

## 2014-06-19 DIAGNOSIS — Z13220 Encounter for screening for lipoid disorders: Secondary | ICD-10-CM | POA: Diagnosis not present

## 2014-06-19 DIAGNOSIS — Z136 Encounter for screening for cardiovascular disorders: Principal | ICD-10-CM

## 2014-06-19 DIAGNOSIS — E039 Hypothyroidism, unspecified: Secondary | ICD-10-CM | POA: Diagnosis not present

## 2014-06-19 DIAGNOSIS — M1710 Unilateral primary osteoarthritis, unspecified knee: Secondary | ICD-10-CM

## 2014-06-19 DIAGNOSIS — IMO0002 Reserved for concepts with insufficient information to code with codable children: Secondary | ICD-10-CM

## 2014-06-19 LAB — CBC WITH DIFFERENTIAL/PLATELET
Basophils Absolute: 0.1 10*3/uL (ref 0.0–0.1)
Basophils Relative: 1 % (ref 0–1)
EOS ABS: 0.3 10*3/uL (ref 0.0–0.7)
Eosinophils Relative: 5 % (ref 0–5)
HCT: 41.3 % (ref 36.0–46.0)
HEMOGLOBIN: 14.2 g/dL (ref 12.0–15.0)
LYMPHS ABS: 1.5 10*3/uL (ref 0.7–4.0)
Lymphocytes Relative: 27 % (ref 12–46)
MCH: 31.1 pg (ref 26.0–34.0)
MCHC: 34.4 g/dL (ref 30.0–36.0)
MCV: 90.6 fL (ref 78.0–100.0)
MPV: 8.4 fL — ABNORMAL LOW (ref 8.6–12.4)
Monocytes Absolute: 0.4 10*3/uL (ref 0.1–1.0)
Monocytes Relative: 7 % (ref 3–12)
NEUTROS ABS: 3.2 10*3/uL (ref 1.7–7.7)
Neutrophils Relative %: 60 % (ref 43–77)
PLATELETS: 260 10*3/uL (ref 150–400)
RBC: 4.56 MIL/uL (ref 3.87–5.11)
RDW: 13.7 % (ref 11.5–15.5)
WBC: 5.4 10*3/uL (ref 4.0–10.5)

## 2014-06-19 LAB — COMPLETE METABOLIC PANEL WITH GFR
ALBUMIN: 4 g/dL (ref 3.5–5.2)
ALK PHOS: 57 U/L (ref 39–117)
ALT: 17 U/L (ref 0–35)
AST: 16 U/L (ref 0–37)
BILIRUBIN TOTAL: 0.4 mg/dL (ref 0.2–1.2)
BUN: 14 mg/dL (ref 6–23)
CO2: 29 mEq/L (ref 19–32)
CREATININE: 0.65 mg/dL (ref 0.50–1.10)
Calcium: 8.8 mg/dL (ref 8.4–10.5)
Chloride: 106 mEq/L (ref 96–112)
GFR, Est African American: >89 mL/min
GLUCOSE: 82 mg/dL (ref 70–99)
Potassium: 4.8 mEq/L (ref 3.5–5.3)
Sodium: 142 mEq/L (ref 135–145)
Total Protein: 6 g/dL (ref 6.0–8.3)

## 2014-06-19 LAB — LIPID PANEL
CHOL/HDL RATIO: 2.7 ratio
Cholesterol: 197 mg/dL (ref 0–200)
HDL: 72 mg/dL (ref >=46)
LDL CALC: 104 mg/dL — AB (ref 0–99)
TRIGLYCERIDES: 105 mg/dL (ref <150)
VLDL: 21 mg/dL (ref 0–40)

## 2014-06-19 LAB — TSH: TSH: 5.023 u[IU]/mL — AB (ref 0.350–4.500)

## 2014-06-20 ENCOUNTER — Ambulatory Visit (INDEPENDENT_AMBULATORY_CARE_PROVIDER_SITE_OTHER): Payer: Medicare Other | Admitting: Internal Medicine

## 2014-06-20 ENCOUNTER — Encounter: Payer: Self-pay | Admitting: Internal Medicine

## 2014-06-20 VITALS — BP 122/78 | HR 68 | Temp 97.9°F | Ht 64.5 in | Wt 161.0 lb

## 2014-06-20 DIAGNOSIS — Z0000 Encounter for general adult medical examination without abnormal findings: Secondary | ICD-10-CM

## 2014-06-20 DIAGNOSIS — E039 Hypothyroidism, unspecified: Secondary | ICD-10-CM

## 2014-06-20 LAB — POCT URINALYSIS DIPSTICK
Bilirubin, UA: NEGATIVE
Glucose, UA: NEGATIVE
Ketones, UA: NEGATIVE
Leukocytes, UA: NEGATIVE
NITRITE UA: NEGATIVE
PROTEIN UA: NEGATIVE
RBC UA: NEGATIVE
SPEC GRAV UA: 1.01
UROBILINOGEN UA: NEGATIVE
pH, UA: 6.5

## 2014-06-20 LAB — VITAMIN D 25 HYDROXY (VIT D DEFICIENCY, FRACTURES): Vit D, 25-Hydroxy: 42 ng/mL (ref 30–100)

## 2014-06-20 MED ORDER — LEVOTHYROXINE SODIUM 125 MCG PO TABS: 125.0000 ug | ORAL_TABLET | Freq: Every day | ORAL | Status: DC

## 2014-06-20 NOTE — Progress Notes (Signed)
Subjective:    Patient ID: [name], female    DOB: [birth year], 69 y.o.   MRN: [id number]  HPI  69 year old White Female for health maintenance and evaluation of medical issues. This is her welcome to Medicare physical examination. She has a history of herpes simplex type II, GE reflux, allergic rhinitis, hypothyroidism, neuropathy right foot, anxiety.  Previously was carried on [employer] but husband recently retired. She is status post right hip replacement April 2014.  Left breast biopsy which was benign in 1972. Lumbar disc surgery at L4 in 1999. Lumbar disc surgery at L5 1981. Cholecystectomy 2004. Colonoscopy by Dr. Olevia Perches November 2007 with 10 year follow-up recommended.  GYN exam done by [facility] in [facility].  She has a healthcare power of attorney  She is allergic to penicillin-causes anaphylaxis.  Social history: Husband retired from [employer] in [place] recently. They reside in [facility]. Patient has 2 adult daughters from previous marriage. Patient does not smoke. Social alcohol consumption. 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  Family history: Father died at age 34 of an MI. Mother with history of neurological problems and heart disease. One brother died at age 83 of congestive heart failure secondary to rheumatic fever complications. One brother in good health. One sister with hypertension and obesity. Another sister with hypertension and obesity.       Review of Systems  Constitutional: Negative.   Respiratory: Negative.   Cardiovascular: Negative.   Gastrointestinal: Negative.   Psychiatric/Behavioral:       History of anxiety-has improved recently with decrease in situational stress       Objective:   Physical Exam  Constitutional: She is oriented to person, place, and time. She appears well-developed and well-nourished. No distress.  HENT:  Head: Normocephalic and  atraumatic.  Right Ear: External ear normal.  Left Ear: External ear normal.  Mouth/Throat: Oropharynx is clear and moist. No oropharyngeal exudate.  Eyes: Conjunctivae and EOM are normal. Pupils are equal, round, and reactive to light. Right eye exhibits no discharge. Left eye exhibits no discharge. No scleral icterus.  Neck: Neck supple. No JVD present. No thyromegaly present.  Cardiovascular: Normal rate, regular rhythm and normal heart sounds.   No murmur heard. Pulmonary/Chest: Effort normal and breath sounds normal. No respiratory distress. She has no wheezes. She has no rales.  Abdominal: Soft. Bowel sounds are normal. She exhibits no distension and no mass. There is no tenderness. There is no rebound and no guarding.  Genitourinary:  Deferred to GYN  Musculoskeletal: Normal range of motion. She exhibits no edema.  Lymphadenopathy:    She has no cervical adenopathy.  Neurological: She is alert and oriented to person, place, and time. She has normal reflexes. No cranial nerve deficit. Coordination normal.  Skin: Skin is warm and dry. No rash noted. She is not diaphoretic.  Psychiatric: She has a normal mood and affect. Her behavior is normal. Judgment and thought content normal.  Vitals reviewed.         Assessment & Plan:  Hypothyroidism-TSH elevated. Need to adjust thyroid replacement medication and repeat TSH in 6-8 weeks. Change dose to 0.125 mg daily.  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 She had Pneumovax immunization April 2012. Colonoscopy due 2017.  Subjective:   Patient presents for Medicare Annual/Subsequent preventive examination.  Review Past  Medical/Family/Social:   Risk Factors  Current exercise habits: Walks and works out Dietary issues discussed: Low fat low carb  Cardiac risk factors: Family history father died of an MI  Depression Screen  (Note: if answer to either of the following is "Yes", a more complete depression screening is indicated)   Over the past two weeks, have you felt down, depressed or hopeless? No  Over the past two weeks, have you felt little interest or pleasure in doing things? No Have you lost interest or pleasure in daily life? No Do you often feel hopeless? No Do you cry easily over simple problems? No   Activities of Daily Living  In your present state of health, do you have any difficulty performing the following activities?:   Driving? No  Managing money? No  Feeding yourself? No  Getting from bed to chair? No  Climbing a flight of stairs? No  Preparing food and eating?: No  Bathing or showering? No  Getting dressed: No  Getting to the toilet? No  Using the toilet:No  Moving around from place to place: No  In the past year have you fallen or had a near fall?:No  Are you sexually active? Yes Do you have more than one partner? No   Hearing Difficulties: No  Do you often ask people to speak up or repeat themselves? No  Do you experience ringing or noises in your ears? Sometimes Do you have difficulty understanding soft or whispered voices? Sometimes Do you feel that you have a problem with memory? No Do you often misplace items? No    Home Safety:  Do you have a smoke alarm at your residence? Yes Do you have grab bars in the bathroom? Yes Do you have throw rugs in your house? Yes   Cognitive Testing  Alert? Yes Normal Appearance?Yes  Oriented to person? Yes Place? Yes  Time? Yes  Recall of three objects? Yes  Can perform simple calculations? Yes  Displays appropriate judgment?Yes  Can read the correct time from a watch face?Yes   List the Names of Other  Physician/Practitioners you currently use:  See referral list for the physicians patient is currently seeing.  OB/GYN   Review of Systems: See above   Objective:     General appearance: Appears stated age  Head: Normocephalic, without obvious abnormality, atraumatic  Eyes: conj clear, EOMi PEERLA  Ears: normal TM's and external ear canals both ears  Nose: Nares normal. Septum midline. Mucosa normal. No drainage or sinus tenderness.  Throat: lips, mucosa, and tongue normal; teeth and gums normal  Neck: no adenopathy, no carotid bruit, no JVD, supple, symmetrical, trachea midline and thyroid not enlarged, symmetric, no tenderness/mass/nodules  No CVA tenderness.  Lungs: clear to auscultation bilaterally  Breasts: normal appearance, no masses or tenderness Heart: regular rate and rhythm, S1, S2 normal, no murmur, click, rub or gallop  Abdomen: soft, non-tender; bowel sounds normal; no masses, no organomegaly  Musculoskeletal: ROM normal in all joints, no crepitus, no deformity, Normal muscle strengthen. Back  is symmetric, no curvature. Skin: Skin color, texture, turgor normal. No rashes or lesions  Lymph nodes: Cervical, supraclavicular, and axillary nodes normal.  Neurologic: CN 2 -12 Normal, Normal symmetric reflexes. Normal coordination and gait  Psych: Alert & Oriented x  3, Mood appear stable.    Assessment:    Annual wellness medicare exam   Plan:    During the course of the visit the patient was educated and counseled about appropriate screening and preventive services including:  Annual mammogram  Needs Prevnar      Patient Instructions (the written plan) was given to the patient.  Medicare Attestation  I have personally reviewed:  The patient's medical and social history  Their use of alcohol, tobacco or illicit drugs  Their current medications and supplements  The patient's functional ability including ADLs,fall risks, home safety risks, cognitive, and  hearing and visual impairment  Diet and physical activities  Evidence for depression or mood disorders  The patient's weight, height, BMI, and visual acuity have been recorded in the chart. I have made referrals, counseling, and provided education to the patient based on review of the above and I have provided the patient with a written personalized care plan for preventive services.

## 2014-08-08 ENCOUNTER — Other Ambulatory Visit: Payer: Medicare Other | Admitting: Internal Medicine

## 2014-08-08 DIAGNOSIS — E039 Hypothyroidism, unspecified: Secondary | ICD-10-CM | POA: Diagnosis not present

## 2014-08-09 LAB — TSH: TSH: 0.148 u[IU]/mL — ABNORMAL LOW (ref 0.350–4.500)

## 2014-08-15 ENCOUNTER — Other Ambulatory Visit: Payer: Self-pay | Admitting: Internal Medicine

## 2014-08-15 NOTE — Patient Instructions (Signed)
Change dose of thyroid replacement 0.125 mg daily. Return in 6-8 weeks for repeat TSH without office visit

## 2014-08-17 ENCOUNTER — Ambulatory Visit: Payer: Self-pay | Admitting: Internal Medicine

## 2014-08-17 ENCOUNTER — Other Ambulatory Visit: Payer: Medicare Other | Admitting: Internal Medicine

## 2014-08-18 ENCOUNTER — Ambulatory Visit: Payer: Medicare Other | Admitting: Internal Medicine

## 2014-08-22 ENCOUNTER — Ambulatory Visit (INDEPENDENT_AMBULATORY_CARE_PROVIDER_SITE_OTHER): Payer: Medicare Other | Admitting: Internal Medicine

## 2014-08-22 ENCOUNTER — Encounter: Payer: Self-pay | Admitting: Internal Medicine

## 2014-08-22 VITALS — BP 122/74 | HR 69 | Temp 98.0°F | Wt 151.0 lb

## 2014-08-22 DIAGNOSIS — E039 Hypothyroidism, unspecified: Secondary | ICD-10-CM | POA: Diagnosis not present

## 2014-08-22 DIAGNOSIS — R7989 Other specified abnormal findings of blood chemistry: Secondary | ICD-10-CM | POA: Diagnosis not present

## 2014-08-22 DIAGNOSIS — R197 Diarrhea, unspecified: Secondary | ICD-10-CM | POA: Diagnosis not present

## 2014-08-22 MED ORDER — LEVOTHYROXINE SODIUM 112 MCG PO TABS: 112.0000 ug | ORAL_TABLET | Freq: Every day | ORAL | Status: DC

## 2014-08-22 NOTE — Patient Instructions (Signed)
Change dose of thyroid replacement 0.112 mg daily and follow-up in 8 weeks with TSH only without office visit.

## 2014-08-22 NOTE — Progress Notes (Signed)
   Subjective:    Patient ID: [name], female    DOB: November 09, 1945, 69 y.o.   MRN: [id number]  HPI Had onset of diarrhea about 3 weeks ago. Initially thought it had to do with change in thyroid medication dosage but I think she probably had Norovirus. Initially had multiple stools daily but this has decreased and stools are now becoming formed although it's been nearly 3 weeks. Has had no blood in stool. No fever or chills.  Original appointment was made to follow-up on hypothyroidism. At last visit her TSH was high at 5.023 and her dose was increased to levothyroxin 0.125 mg daily. Now TSH is too low at 0.148.  Patient is trying to enjoy house at [facility]. Things are better with patient and her husband.  Review of Systems     Objective:   Physical Exam  Spent 20 minutes speaking with patient about diarrhea, hypothyroidism management, life stress. Not examined      Assessment & Plan:  [place]. Advance diet slowly.  Hypothyroidism-TSH is low indicating over replacement with thyroid medication. Change dose to 0.112 mg daily and follow-up in 8 weeks with TSH only without office visit

## 2014-09-25 DIAGNOSIS — H43813 Vitreous degeneration, bilateral: Secondary | ICD-10-CM | POA: Diagnosis not present

## 2014-09-25 DIAGNOSIS — H02834 Dermatochalasis of left upper eyelid: Secondary | ICD-10-CM | POA: Diagnosis not present

## 2014-09-25 DIAGNOSIS — H52223 Regular astigmatism, bilateral: Secondary | ICD-10-CM | POA: Diagnosis not present

## 2014-09-25 DIAGNOSIS — H02831 Dermatochalasis of right upper eyelid: Secondary | ICD-10-CM | POA: Diagnosis not present

## 2014-09-25 DIAGNOSIS — H04123 Dry eye syndrome of bilateral lacrimal glands: Secondary | ICD-10-CM | POA: Diagnosis not present

## 2014-09-25 DIAGNOSIS — H524 Presbyopia: Secondary | ICD-10-CM | POA: Diagnosis not present

## 2014-09-25 DIAGNOSIS — H2513 Age-related nuclear cataract, bilateral: Secondary | ICD-10-CM | POA: Diagnosis not present

## 2014-09-25 DIAGNOSIS — D3132 Benign neoplasm of left choroid: Secondary | ICD-10-CM | POA: Diagnosis not present

## 2014-10-19 ENCOUNTER — Other Ambulatory Visit: Payer: Medicare Other | Admitting: Internal Medicine

## 2014-11-06 ENCOUNTER — Other Ambulatory Visit: Payer: Medicare Other | Admitting: Internal Medicine

## 2014-11-06 DIAGNOSIS — E039 Hypothyroidism, unspecified: Secondary | ICD-10-CM | POA: Diagnosis not present

## 2014-11-06 LAB — TSH: TSH: 0.262 u[IU]/mL — ABNORMAL LOW (ref 0.350–4.500)

## 2014-11-07 ENCOUNTER — Telehealth: Payer: Self-pay | Admitting: *Deleted

## 2014-11-07 MED ORDER — LEVOTHYROXINE SODIUM 100 MCG PO TABS: 100.0000 ug | ORAL_TABLET | Freq: Every day | ORAL | Status: DC

## 2014-11-07 NOTE — Telephone Encounter (Signed)
Spoke with patient reviewed TSH results and dosage change for levothyroxine. Patient scheduled for 8 week follow up with TSH recheck

## 2014-12-06 ENCOUNTER — Other Ambulatory Visit: Payer: Self-pay | Admitting: Internal Medicine

## 2014-12-06 ENCOUNTER — Telehealth: Payer: Self-pay | Admitting: *Deleted

## 2014-12-06 DIAGNOSIS — R197 Diarrhea, unspecified: Secondary | ICD-10-CM | POA: Diagnosis not present

## 2014-12-06 NOTE — Telephone Encounter (Signed)
Patient returned stool samples. Samples sent to lab for processing.

## 2014-12-07 ENCOUNTER — Ambulatory Visit (INDEPENDENT_AMBULATORY_CARE_PROVIDER_SITE_OTHER): Payer: Medicare Other | Admitting: Internal Medicine

## 2014-12-07 ENCOUNTER — Encounter: Payer: Self-pay | Admitting: Internal Medicine

## 2014-12-07 VITALS — BP 120/70 | HR 79 | Temp 97.8°F | Ht 65.0 in | Wt 150.0 lb

## 2014-12-07 DIAGNOSIS — E039 Hypothyroidism, unspecified: Secondary | ICD-10-CM | POA: Diagnosis not present

## 2014-12-07 DIAGNOSIS — Z23 Encounter for immunization: Secondary | ICD-10-CM | POA: Diagnosis not present

## 2014-12-07 LAB — C. DIFFICILE GDH AND TOXIN A/B

## 2014-12-07 LAB — OVA AND PARASITE EXAMINATION: OP: NONE SEEN

## 2014-12-07 LAB — TSH: TSH: 1.545 u[IU]/mL (ref 0.350–4.500)

## 2014-12-07 NOTE — Progress Notes (Signed)
   Subjective:    Patient ID: [name], female    DOB: Apr 12, 1945, 69 y.o.   MRN: [id number]  HPI  Onset diarrhea in May thought to be Norovirus initially. Responded initially to clear liquids and diet restriction. Had traveled to [place], [place] a lot and went to [employer] in [place] in April. Initially got better but then has had recurrence of diarrhea up to 4-7 stools daily. Stool is mainly brown liquid. No blood in bowel movement. Sometimes sees bits of undigested food in bowel movement such as lettuce.  No well water consumption that she is aware of. No foreign travel history. Does have grandchildren. No one else is sick.      Review of Systems     Objective:   Physical Exam  Bowel sounds are active. Abdomen is nondistended. She has some very mild tenderness throughout her abdomen. No rebound tenderness appreciated. No hepatosplenomegaly.  No thyromegaly      Assessment & Plan:  Diarrhea-possibly infectious. Stool studies are pending.  Hypothyroidism- may not be absorbing thyroid replacement medication. TSH is drawn today.  25 minutes spent with patient discussing these issues

## 2014-12-07 NOTE — Patient Instructions (Signed)
Stool studies are still pending. May have infectious diarrhea. Thyroid functions are pending. Further instructions to follow pending results.

## 2014-12-08 LAB — STOOL CELLS, WBC & RBC
RBC/40X FIELD:: 0
WBC/40X FIELD (SOL): 0

## 2014-12-08 LAB — FECAL LACTOFERRIN, QUANT: LACTOFERRIN: NEGATIVE

## 2014-12-10 LAB — STOOL CULTURE

## 2014-12-11 ENCOUNTER — Other Ambulatory Visit: Payer: Medicare Other | Admitting: Internal Medicine

## 2014-12-11 DIAGNOSIS — R197 Diarrhea, unspecified: Secondary | ICD-10-CM | POA: Diagnosis not present

## 2014-12-12 LAB — FECAL LACTOFERRIN, QUANT: Lactoferrin: NEGATIVE

## 2014-12-12 LAB — OVA AND PARASITE EXAMINATION: OP: NONE SEEN

## 2014-12-13 LAB — STOOL CELLS, WBC & RBC
RBC/40X FIELD:: 1
WBC/40X FIELD:: 1

## 2014-12-15 ENCOUNTER — Telehealth: Payer: Self-pay | Admitting: *Deleted

## 2014-12-15 LAB — STOOL CULTURE

## 2014-12-15 NOTE — Telephone Encounter (Signed)
Spoke with patient regarding recent stool studies and TSH results . Instructed patient to start Flagyl per Dr Renold Genta . Patient states she no longer has diarrhea and doesn't feel she needs to take the Flagyl will check with Dr Renold Genta and call patient back with clarified instructions

## 2014-12-17 LAB — CLOSTRIDIUM DIFFICILE CULTURE-FECAL

## 2014-12-21 ENCOUNTER — Ambulatory Visit: Payer: Medicare Other | Admitting: Internal Medicine

## 2014-12-21 ENCOUNTER — Ambulatory Visit: Payer: Self-pay | Admitting: Internal Medicine

## 2014-12-25 ENCOUNTER — Other Ambulatory Visit: Payer: Self-pay | Admitting: Internal Medicine

## 2015-02-15 ENCOUNTER — Encounter: Payer: Self-pay | Admitting: Internal Medicine

## 2015-02-15 ENCOUNTER — Ambulatory Visit (INDEPENDENT_AMBULATORY_CARE_PROVIDER_SITE_OTHER): Payer: Medicare Other | Admitting: Internal Medicine

## 2015-02-15 VITALS — BP 150/80 | HR 70 | Temp 97.1°F | Resp 20 | Ht 65.0 in | Wt 152.5 lb

## 2015-02-15 DIAGNOSIS — W57XXXA Bitten or stung by nonvenomous insect and other nonvenomous arthropods, initial encounter: Secondary | ICD-10-CM | POA: Diagnosis not present

## 2015-02-15 DIAGNOSIS — S50862A Insect bite (nonvenomous) of left forearm, initial encounter: Secondary | ICD-10-CM | POA: Diagnosis not present

## 2015-02-15 DIAGNOSIS — L259 Unspecified contact dermatitis, unspecified cause: Secondary | ICD-10-CM | POA: Diagnosis not present

## 2015-02-15 MED ORDER — TRIAMCINOLONE ACETONIDE 0.1 % EX CREA: 1.0000 "application " | TOPICAL_CREAM | Freq: Three times a day (TID) | CUTANEOUS | Status: DC

## 2015-02-15 NOTE — Progress Notes (Signed)
   Subjective:    Patient ID: [name], female    DOB: [birth year], 69 y.o.   MRN: [id number]  HPI Husband called urgently for her at 8 AM this morning saying she needed to be seen . Said she had a lesion on left arm that she thought might be a spider bite but had spread and now was concerned about shingles. She was told to come on to the office at 9:30 AM. She has order for Zostavax vaccine but has not gotten the Zostavax vaccine yet. Patient says initial lesion was left forearm volar aspect. Michela Pitcher it had a central vesicle on it. That is why she thought it might be an insect bite. Later developed some erythematous papules further up left arm, left trunk, left groin.    Review of Systems     Objective:   Physical Exam She has an area of linear erythema left trunk that looks like contact dermatitis such as poison ivy. Doesn't know of any poison ivy exposure. She has other erythematous papules on her left trunk and left groin that are itchy. Lesion left forearm feels irritated and burns she says. This looks more like an insect bite.       Assessment & Plan:  Contact dermatitis  Possible insect bite left forearm  Plan: Triamcinolone cream 0.1% to use on lesions 3 times daily. Call if symptoms worsen. Go ahead and get Zostavax vaccine in a couple of weeks.

## 2015-02-15 NOTE — Patient Instructions (Signed)
Use triamcinolone cream as directed 3 times daily

## 2015-04-10 DIAGNOSIS — M25562 Pain in left knee: Secondary | ICD-10-CM | POA: Diagnosis not present

## 2015-05-21 DIAGNOSIS — J209 Acute bronchitis, unspecified: Secondary | ICD-10-CM | POA: Diagnosis not present

## 2015-05-21 DIAGNOSIS — J019 Acute sinusitis, unspecified: Secondary | ICD-10-CM | POA: Diagnosis not present

## 2015-06-20 DIAGNOSIS — Z1231 Encounter for screening mammogram for malignant neoplasm of breast: Secondary | ICD-10-CM | POA: Diagnosis not present

## 2015-06-26 DIAGNOSIS — I8312 Varicose veins of left lower extremity with inflammation: Secondary | ICD-10-CM | POA: Diagnosis not present

## 2015-07-12 DIAGNOSIS — L218 Other seborrheic dermatitis: Secondary | ICD-10-CM | POA: Diagnosis not present

## 2015-07-20 ENCOUNTER — Other Ambulatory Visit: Payer: Self-pay | Admitting: Internal Medicine

## 2015-07-20 DIAGNOSIS — N816 Rectocele: Secondary | ICD-10-CM | POA: Diagnosis not present

## 2015-07-20 DIAGNOSIS — Z01419 Encounter for gynecological examination (general) (routine) without abnormal findings: Secondary | ICD-10-CM | POA: Diagnosis not present

## 2015-07-20 DIAGNOSIS — N8112 Cystocele, lateral: Secondary | ICD-10-CM | POA: Diagnosis not present

## 2015-07-20 NOTE — Telephone Encounter (Signed)
Past due for CPE and no appt scheduled. Please call.

## 2015-07-23 DIAGNOSIS — N8112 Cystocele, lateral: Secondary | ICD-10-CM | POA: Insufficient documentation

## 2015-07-23 DIAGNOSIS — N816 Rectocele: Secondary | ICD-10-CM | POA: Insufficient documentation

## 2015-07-23 NOTE — Telephone Encounter (Signed)
Left message advising patient to contact office to schedule CPE

## 2015-08-20 ENCOUNTER — Other Ambulatory Visit: Payer: Self-pay | Admitting: Internal Medicine

## 2015-08-20 NOTE — Telephone Encounter (Signed)
Left message for patient to call office to schedule appt.

## 2015-08-20 NOTE — Telephone Encounter (Signed)
Please call. No recent CPE and last TSH was in September.

## 2015-09-21 ENCOUNTER — Other Ambulatory Visit: Payer: Self-pay

## 2015-09-21 DIAGNOSIS — D3132 Benign neoplasm of left choroid: Secondary | ICD-10-CM | POA: Insufficient documentation

## 2015-09-21 DIAGNOSIS — H524 Presbyopia: Secondary | ICD-10-CM | POA: Insufficient documentation

## 2015-09-21 DIAGNOSIS — H2513 Age-related nuclear cataract, bilateral: Secondary | ICD-10-CM | POA: Insufficient documentation

## 2015-09-21 DIAGNOSIS — H02839 Dermatochalasis of unspecified eye, unspecified eyelid: Secondary | ICD-10-CM | POA: Insufficient documentation

## 2015-09-21 DIAGNOSIS — H04123 Dry eye syndrome of bilateral lacrimal glands: Secondary | ICD-10-CM | POA: Insufficient documentation

## 2015-09-21 DIAGNOSIS — H43813 Vitreous degeneration, bilateral: Secondary | ICD-10-CM | POA: Insufficient documentation

## 2015-09-21 DIAGNOSIS — H52223 Regular astigmatism, bilateral: Secondary | ICD-10-CM | POA: Insufficient documentation

## 2015-09-21 MED ORDER — VALACYCLOVIR HCL 500 MG PO TABS
500.0000 mg | ORAL_TABLET | Freq: Every day | ORAL | Status: DC
Start: 2015-09-21 — End: 2015-11-29

## 2015-10-02 ENCOUNTER — Other Ambulatory Visit: Payer: Medicare Other | Admitting: Internal Medicine

## 2015-10-02 DIAGNOSIS — Z130 Encounter for screening for diseases of the blood and blood-forming organs and certain disorders involving the immune mechanism: Secondary | ICD-10-CM

## 2015-10-02 DIAGNOSIS — Z1321 Encounter for screening for nutritional disorder: Secondary | ICD-10-CM | POA: Diagnosis not present

## 2015-10-02 DIAGNOSIS — Z13220 Encounter for screening for lipoid disorders: Secondary | ICD-10-CM

## 2015-10-02 DIAGNOSIS — E039 Hypothyroidism, unspecified: Secondary | ICD-10-CM | POA: Diagnosis not present

## 2015-10-02 DIAGNOSIS — Z0000 Encounter for general adult medical examination without abnormal findings: Secondary | ICD-10-CM

## 2015-10-02 LAB — COMPLETE METABOLIC PANEL WITH GFR
ALT: 16 U/L (ref 6–29)
AST: 18 U/L (ref 10–35)
Albumin: 4.1 g/dL (ref 3.6–5.1)
Alkaline Phosphatase: 66 U/L (ref 33–130)
BUN: 17 mg/dL (ref 7–25)
CO2: 29 mmol/L (ref 20–31)
Calcium: 9.3 mg/dL (ref 8.6–10.4)
Chloride: 104 mmol/L (ref 98–110)
Creat: 0.79 mg/dL (ref 0.60–0.93)
GFR, Est African American: 86 mL/min (ref >=60)
GFR, Est Non African American: 75 mL/min (ref >=60)
Glucose, Bld: 80 mg/dL (ref 65–99)
Potassium: 4.4 mmol/L (ref 3.5–5.3)
Sodium: 140 mmol/L (ref 135–146)
Total Bilirubin: 0.6 mg/dL (ref 0.2–1.2)
Total Protein: 6.4 g/dL (ref 6.1–8.1)

## 2015-10-02 LAB — CBC WITH DIFFERENTIAL/PLATELET
Basophils Absolute: 46 cells/uL (ref 0–200)
Basophils Relative: 1 %
EOS ABS: 184 {cells}/uL (ref 15–500)
Eosinophils Relative: 4 %
HCT: 42.6 % (ref 35.0–45.0)
Hemoglobin: 14.2 g/dL (ref 11.7–15.5)
LYMPHS PCT: 29 %
Lymphs Abs: 1334 cells/uL (ref 850–3900)
MCH: 30.6 pg (ref 27.0–33.0)
MCHC: 33.3 g/dL (ref 32.0–36.0)
MCV: 91.8 fL (ref 80.0–100.0)
MONO ABS: 368 {cells}/uL (ref 200–950)
MONOS PCT: 8 %
MPV: 8.7 fL (ref 7.5–12.5)
NEUTROS PCT: 58 %
Neutro Abs: 2668 cells/uL (ref 1500–7800)
PLATELETS: 293 10*3/uL (ref 140–400)
RBC: 4.64 MIL/uL (ref 3.80–5.10)
RDW: 13.4 % (ref 11.0–15.0)
WBC: 4.6 10*3/uL (ref 3.8–10.8)

## 2015-10-02 LAB — TSH: TSH: 7.86 mIU/L — ABNORMAL HIGH

## 2015-10-02 LAB — LIPID PANEL
CHOLESTEROL: 189 mg/dL (ref 125–200)
HDL: 83 mg/dL (ref >=46)
LDL CALC: 93 mg/dL (ref <130)
TRIGLYCERIDES: 65 mg/dL (ref <150)
Total CHOL/HDL Ratio: 2.3 Ratio (ref <=5.0)
VLDL: 13 mg/dL (ref <30)

## 2015-10-03 LAB — VITAMIN D 25 HYDROXY (VIT D DEFICIENCY, FRACTURES): Vit D, 25-Hydroxy: 46 ng/mL (ref 30–100)

## 2015-10-04 ENCOUNTER — Ambulatory Visit (INDEPENDENT_AMBULATORY_CARE_PROVIDER_SITE_OTHER): Payer: Medicare Other | Admitting: Internal Medicine

## 2015-10-04 ENCOUNTER — Encounter: Payer: Self-pay | Admitting: Internal Medicine

## 2015-10-04 VITALS — BP 134/84 | HR 81 | Temp 98.1°F | Ht 65.0 in | Wt 150.0 lb

## 2015-10-04 DIAGNOSIS — Z96641 Presence of right artificial hip joint: Secondary | ICD-10-CM | POA: Diagnosis not present

## 2015-10-04 DIAGNOSIS — Z8659 Personal history of other mental and behavioral disorders: Secondary | ICD-10-CM | POA: Diagnosis not present

## 2015-10-04 DIAGNOSIS — E039 Hypothyroidism, unspecified: Secondary | ICD-10-CM

## 2015-10-04 DIAGNOSIS — J309 Allergic rhinitis, unspecified: Secondary | ICD-10-CM

## 2015-10-04 DIAGNOSIS — K219 Gastro-esophageal reflux disease without esophagitis: Secondary | ICD-10-CM

## 2015-10-04 DIAGNOSIS — B009 Herpesviral infection, unspecified: Secondary | ICD-10-CM | POA: Diagnosis not present

## 2015-10-04 DIAGNOSIS — Z0000 Encounter for general adult medical examination without abnormal findings: Secondary | ICD-10-CM

## 2015-10-04 LAB — POCT URINALYSIS DIPSTICK
BILIRUBIN UA: NEGATIVE
Glucose, UA: NEGATIVE
Ketones, UA: NEGATIVE
Leukocytes, UA: NEGATIVE
NITRITE UA: NEGATIVE
PH UA: 5
Protein, UA: NEGATIVE
RBC UA: NEGATIVE
SPEC GRAV UA: 1.01
UROBILINOGEN UA: 0.2

## 2015-10-04 NOTE — Progress Notes (Signed)
Subjective:    Patient ID: [name], female    DOB: 08-27-1945, 70 y.o.   MRN: [id number]  HPI 70 year old White Female for health maintenance and evaluation of medical issues.Long-standing history of hypothyroidism. TSH is elevated on Levothyroxine 100 mcg daily. Will increase to Levothyroxine112 g daily with follow-up in a few weeks.  In 2016, she had Welcome to Medicare physical exam. She has a history of herpes simplex type II, GE reflux, allergic rhinitis, hypothyroidism, neuropathy of right foot and anxiety.  She is status post right hip replacement April 2014.  Left breast biopsy which was benign in 1972. Lumbar disc surgery at L4 1999. Lumbar disc surgery at L5 in 1981. Cholecystectomy 2004. Colonoscopy by Dr. Olevia Perches November 2007 with 10 year follow-up recommended.  GYN exam is done about time weight is stable OB/GYN.  She has a healthcare power of attorney and has provided [place] with those documents.  She is allergic to penicillin-causes anaphylaxis  Social history: Husband retired as Software engineer of[employer] last year. They reside in [facility]. Patient has 2 adult daughters from a previous marriage. She does not smoke. Social alcohol consumption. 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  Family history: Father died at age 5 of an MI. Mother with history of neurological problems and heart disease. One brother died at age 26 of congestive heart failure secondary to rheumatic fever complications. One brother in good health. One sister with hypertension and obesity. Another sister with hypertension and obesity.        Review of Systems  Constitutional: Negative.   Psychiatric/Behavioral:       History of anxiety which is stable and actually has improved  All other systems reviewed and are negative.      Objective:   Physical Exam  Constitutional: She is oriented to person, place, and time. She appears  well-developed and well-nourished. No distress.  HENT:  Head: Normocephalic and atraumatic.  Right Ear: External ear normal.  Left Ear: External ear normal.  Mouth/Throat: Oropharynx is clear and moist. No oropharyngeal exudate.  Eyes: Conjunctivae and EOM are normal. Pupils are equal, round, and reactive to light. Right eye exhibits no discharge. Left eye exhibits no discharge. No scleral icterus.  Neck: Neck supple. No JVD present. No thyromegaly present.  Cardiovascular: Normal rate, regular rhythm, normal heart sounds and intact distal pulses.   No murmur heard. Pulmonary/Chest: Effort normal and breath sounds normal. No respiratory distress. She has no wheezes. She has no rales.  Breasts normal female without masses  Abdominal: Soft. Bowel sounds are normal. She exhibits no distension and no mass. There is no tenderness. There is no rebound and no guarding.  Genitourinary:  Genitourinary Comments: Deferred to GYN  Musculoskeletal: She exhibits no edema.  Lymphadenopathy:    She has no cervical adenopathy.  Neurological: She is alert and oriented to person, place, and time. She has normal reflexes. No cranial nerve deficit. Coordination normal.  Skin: Skin is warm and dry. No rash noted. She is not diaphoretic.  Psychiatric: She has a normal mood and affect. Her behavior is normal. Thought content normal.  Vitals reviewed.         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   Subjective:   Patient presents for Medicare Annual/Subsequent preventive examination.  Review Past Medical/Family/Social:See  above   Risk Factors  Current exercise habits: Stays physically active in good shape. Dietary issues discussed: Low fat low carbohydrate-has recently been on new diet but seems to agree with her  Cardiac risk factors:  Depression Screen  (Note: if answer to either of the following is "Yes", a more complete depression screening is indicated)   Over the past two weeks, have you felt down, depressed or hopeless? No  Over the past two weeks, have you felt little interest or pleasure in doing things? No Have you lost interest or pleasure in daily life? No Do you often feel hopeless? No Do you cry easily over simple problems? No   Activities of Daily Living  In your present state of health, do you have any difficulty performing the following activities?:   Driving? No  Managing money? No  Feeding yourself? No  Getting from bed to chair? No  Climbing a flight of stairs? No  Preparing food and eating?: No  Bathing or showering? No  Getting dressed: No  Getting to the toilet? No  Using the toilet:No  Moving around from place to place: No  In the past year have you fallen or had a near fall?:No  Are you sexually active? yes Do you have more than one partner? No   Hearing Difficulties: No  Do you often ask people to speak up or repeat themselves? Sometimes Do you experience ringing or noises in your ears? No  Do you have difficulty understanding soft or whispered voices? Sometimes Do you feel that you have a problem with memory? No Do you often misplace items? No    Home Safety:  Do you have a smoke alarm at your residence? Yes Do you have grab bars in the bathroom? Yes Do you have throw rugs in your house? Yes   Cognitive Testing  Alert? Yes Normal Appearance?Yes  Oriented to person? Yes Place? Yes  Time? Yes  Recall of three objects? Yes  Can perform simple calculations? Yes  Displays appropriate judgment?Yes  Can read the correct time from a watch face?Yes   List  the Names of Other Physician/Practitioners you currently use:  See referral list for the physicians patient is currently seeing.     Review of Systems: See above   Objective:     General appearance: Appears stated age   Head: Normocephalic, without obvious abnormality, atraumatic  Eyes: conj clear, EOMi PEERLA  Ears: normal TM's and external ear canals both ears  Nose: Nares normal. Septum midline. Mucosa normal. No drainage or sinus tenderness.  Throat: lips, mucosa, and tongue normal; teeth and gums normal  Neck: no adenopathy, no carotid bruit, no JVD, supple, symmetrical, trachea midline and thyroid not enlarged, symmetric, no tenderness/mass/nodules  No CVA tenderness.  Lungs: clear to auscultation bilaterally  Breasts: normal appearance, no masses or tenderness  Heart: regular rate and rhythm, S1, S2 normal, no murmur, click, rub or gallop  Abdomen: soft, non-tender; bowel sounds normal; no masses, no organomegaly  Musculoskeletal: ROM normal in all joints, no crepitus, no deformity, Normal muscle strengthen. Back  is symmetric, no curvature. Skin: Skin color, texture, turgor normal. No rashes or lesions  Lymph nodes: Cervical, supraclavicular, and axillary nodes normal.  Neurologic: CN  2 -12 Normal, Normal symmetric reflexes. Normal coordination and gait  Psych: Alert & Oriented x 3, Mood appear stable.    Assessment:    Annual wellness medicare exam   Plan:    During the course of the visit the patient was educated and counseled about appropriate screening and preventive services including:   Annual mammogram  Annual flu vaccine     Patient Instructions (the written plan) was given to the patient.  Medicare Attestation  I have personally reviewed:  The patient's medical and social history  Their use of alcohol, tobacco or illicit drugs  Their current medications and supplements  The patient's functional ability including ADLs,fall risks, home safety risks,  cognitive, and hearing and visual impairment  Diet and physical activities  Evidence for depression or mood disorders  The patient's weight, height, BMI, and visual acuity have been recorded in the chart. I have made referrals, counseling, and provided education to the patient based on review of the above and I have provided the patient with a written personalized care plan for preventive services.

## 2015-10-10 ENCOUNTER — Encounter: Payer: Self-pay | Admitting: Internal Medicine

## 2015-10-10 ENCOUNTER — Other Ambulatory Visit: Payer: Self-pay | Admitting: Internal Medicine

## 2015-10-10 MED ORDER — LEVOTHYROXINE SODIUM 112 MCG PO TABS: 112.0000 ug | ORAL_TABLET | Freq: Every day | ORAL | 0 refills | Status: DC

## 2015-10-10 NOTE — Telephone Encounter (Signed)
Some confusion with medication refill for hypothyroidism. At last visit we had increased her dose to 0.112 mg daily. We are discontinuing 0.1 mg daily. She has follow-up appointment in September with TSH without office visit.

## 2015-10-14 MED ORDER — LEVOTHYROXINE SODIUM 112 MCG PO TABS: 112.0000 ug | ORAL_TABLET | Freq: Every day | ORAL | 3 refills | Status: DC

## 2015-10-14 NOTE — Patient Instructions (Signed)
It was a pleasure to see you today. Increase levothyroxine to 0.112 mg daily and return in a few weeks for follow-up with TSH only without office visit.

## 2015-10-17 ENCOUNTER — Telehealth: Payer: Self-pay

## 2015-10-17 NOTE — Telephone Encounter (Signed)
Spoke to patient and scheduled PCV 13 injection.

## 2015-10-24 ENCOUNTER — Ambulatory Visit (INDEPENDENT_AMBULATORY_CARE_PROVIDER_SITE_OTHER): Payer: Medicare Other | Admitting: Internal Medicine

## 2015-10-24 ENCOUNTER — Encounter: Payer: Self-pay | Admitting: Internal Medicine

## 2015-10-24 DIAGNOSIS — Z23 Encounter for immunization: Secondary | ICD-10-CM

## 2015-10-24 NOTE — Progress Notes (Signed)
Prevnar 13 given right arm. Lot [id number] 7/18 [id number]

## 2015-10-26 ENCOUNTER — Encounter: Payer: Self-pay | Admitting: Gastroenterology

## 2015-11-05 ENCOUNTER — Encounter: Payer: Self-pay | Admitting: Gastroenterology

## 2015-11-26 ENCOUNTER — Other Ambulatory Visit: Payer: Medicare Other | Admitting: Internal Medicine

## 2015-11-26 DIAGNOSIS — E039 Hypothyroidism, unspecified: Secondary | ICD-10-CM

## 2015-11-26 LAB — TSH: TSH: 1.96 m[IU]/L

## 2015-11-27 ENCOUNTER — Telehealth: Payer: Self-pay

## 2015-11-27 NOTE — Telephone Encounter (Signed)
Called patient. Gave lab results. Patient verbalized understanding.  

## 2015-11-27 NOTE — Telephone Encounter (Signed)
-----   Message from Elby Showers, MD sent at 11/26/2015  5:12 PM EDT ----- Please call patient. TSH is now normal on current dose of thyroid replacement. Follow-up in 4-6 months with office visit and TSH

## 2015-11-29 ENCOUNTER — Other Ambulatory Visit: Payer: Self-pay | Admitting: Internal Medicine

## 2015-11-29 ENCOUNTER — Other Ambulatory Visit: Payer: Medicare Other | Admitting: Internal Medicine

## 2015-12-05 DIAGNOSIS — H2513 Age-related nuclear cataract, bilateral: Secondary | ICD-10-CM | POA: Diagnosis not present

## 2015-12-05 DIAGNOSIS — H524 Presbyopia: Secondary | ICD-10-CM | POA: Diagnosis not present

## 2015-12-05 DIAGNOSIS — H04123 Dry eye syndrome of bilateral lacrimal glands: Secondary | ICD-10-CM | POA: Diagnosis not present

## 2016-01-09 DIAGNOSIS — H903 Sensorineural hearing loss, bilateral: Secondary | ICD-10-CM | POA: Diagnosis not present

## 2016-01-09 DIAGNOSIS — Z23 Encounter for immunization: Secondary | ICD-10-CM | POA: Diagnosis not present

## 2016-01-09 DIAGNOSIS — H9313 Tinnitus, bilateral: Secondary | ICD-10-CM | POA: Diagnosis not present

## 2016-08-01 ENCOUNTER — Other Ambulatory Visit (INDEPENDENT_AMBULATORY_CARE_PROVIDER_SITE_OTHER): Payer: Self-pay

## 2016-08-01 DIAGNOSIS — H00024 Hordeolum internum left upper eyelid: Secondary | ICD-10-CM | POA: Diagnosis not present

## 2016-08-01 DIAGNOSIS — M25511 Pain in right shoulder: Secondary | ICD-10-CM

## 2016-08-04 ENCOUNTER — Ambulatory Visit (HOSPITAL_BASED_OUTPATIENT_CLINIC_OR_DEPARTMENT_OTHER)
Admission: RE | Admit: 2016-08-04 | Discharge: 2016-08-04 | Disposition: A | Discharge status: Home / Self Care | Payer: Medicare Other | Source: Ambulatory Visit | Attending: Orthopaedic Surgery | Admitting: Orthopaedic Surgery

## 2016-08-04 ENCOUNTER — Ambulatory Visit (INDEPENDENT_AMBULATORY_CARE_PROVIDER_SITE_OTHER): Payer: Medicare Other | Admitting: Orthopaedic Surgery

## 2016-08-04 DIAGNOSIS — M25511 Pain in right shoulder: Secondary | ICD-10-CM | POA: Insufficient documentation

## 2016-08-04 DIAGNOSIS — M7541 Impingement syndrome of right shoulder: Secondary | ICD-10-CM | POA: Diagnosis not present

## 2016-08-04 MED ORDER — METHYLPREDNISOLONE ACETATE 40 MG/ML IJ SUSP
40.0000 mg | INTRAMUSCULAR | Status: AC | PRN
  Administered 2016-08-04: 40 mg via INTRA_ARTICULAR

## 2016-08-04 MED ORDER — LIDOCAINE HCL 1 % IJ SOLN
3.0000 mL | INTRAMUSCULAR | Status: AC | PRN
  Administered 2016-08-04: 3 mL

## 2016-08-04 NOTE — Progress Notes (Signed)
Office Visit Note   Patient: [name]           Date of Birth: 09-29-1945           MRN: [id number] Visit Date: 08/04/2016              Requested by: Elby Showers, MD [address] [place], [place] [address] PCP: Elby Showers, MD   Assessment & Plan: Visit Diagnoses:  1. Impingement syndrome of right shoulder     Plan: She tolerated the steroid injection well that I recommended for her right shoulder. I did give her an open ended prescription for physical therapy for back [place] the [place]. If the shoulder bothers her enough she'll try physical therapy next. Once the lidocaine set in her shoulder she was pain-free side do feel that she will do well  Follow-Up Instructions: Return if symptoms worsen or fail to improve.   Orders:  No orders of the defined types were placed in this encounter.  No orders of the defined types were placed in this encounter.     Procedures: Large Joint Inj Date/Time: 08/04/2016 8:53 AM Performed by: Mcarthur Rossetti Authorized by: Mcarthur Rossetti   Location:  Shoulder Site:  R subacromial bursa Ultrasound Guidance: No   Fluoroscopic Guidance: No   Arthrogram: No   Medications:  3 mL lidocaine 1 %; 40 mg methylPREDNISolone acetate 40 MG/ML     Clinical Data: No additional findings.   Subjective: No chief complaint on file. The patient is a regular patient of mine. I haven't seen her though and some time. She comes in with chief complaint today of right shoulder pain. It has been hurting for about 4 months now after starting with yoga. She can't raise her shoulder by overhead well without it hurting but is feeling weakness. There has been no specific injury. It does wake her up somewhat night. Some of her activity is daily living a been affected due to her shoulder pain. She denies any neck pain and denies any numbness in her hand.  HPI  Review of Systems She denies any headache, chest  pain, short of breath, fever, chills, nausea, vomiting.  Objective: Vital Signs: There were no vitals taken for this visit.  Physical Exam She is alert and oriented 3 and in no acute distress Ortho Exam Examination of her left shoulder shows almost full range of motion with only limitation being with internal rotation and adduction which is to the mid thoracic spine. It hurts with overhead activities. Her rotator cuff though itself feels strong and she has a negative liftoff. She does have positive Neer and Hawkins signs. Specialty Comments:  No specialty comments available.  Imaging: Dg Shoulder Right  Result Date: 08/04/2016 CLINICAL DATA:  Right shoulder pain for 3 months. EXAM: RIGHT SHOULDER - 2+ VIEW COMPARISON:  None. FINDINGS: There is no evidence of fracture or dislocation. There is no evidence of arthropathy or other focal bone abnormality. Soft tissues are unremarkable. IMPRESSION: Normal right shoulder. Electronically Signed   By: Marijo Conception, M.D.   On: 08/04/2016 08:20   Independent reviews of the shoulder x-rays by me show no acute injuries. There is mild acromioclavicular arthritic changes. The shoulder itself was well located.  PMFS History: Patient Active Problem List   Diagnosis Date Noted  . Impingement syndrome of right shoulder 08/04/2016  . Degenerative arthritis of hip 07/02/2012  . Osteoarthritis of right hip 05/24/2012  .  GE reflux 09/23/2011  . Hypothyroidism 01/07/2011  . Anxiety 01/07/2011  . Allergic rhinitis 01/07/2011  . 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 Allergic bronchitis   . Allergic bronchitis   . Allergy   . Arthritis   . Claustrophobia   . GERD (gastroesophageal reflux disease)    occas problem - burning, cough would take prilosec if needed  . H/O hiatal hernia   . HSV-2 (herpes simplex virus 2) infection   . Hypothyroidism   . Peripheral neuropathy (HCC)    3 toes right foot  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 problem  . Thyroid disease     Family History  Problem Relation Age of Onset  . Heart disease Father   . Hypertension Sister   . Heart disease Brother   . Asthma Daughter   . Hypertension Sister     Past Surgical History:  Procedure Laterality Date  . BACK SURGERY    . BREAST BIOPSY  1972   benign  . CHOLECYSTECTOMY    . LUMBAR LAMINECTOMY  1999   L4  . LUMBAR LAMINECTOMY  1981   L5  . TOTAL HIP ARTHROPLASTY Right 07/02/2012   Procedure: RIGHT TOTAL HIP ARTHROPLASTY ANTERIOR APPROACH;  Surgeon: Mcarthur Rossetti, MD;  Location: [facility];  Service: Orthopedics;  Laterality: Right;   Social History   Occupational History  . Not on file.   Social History Main Topics  . Smoking status: Never Smoker  . Smokeless tobacco: Never Used  . Alcohol use Yes     Comment: socially  . Drug use: No  . Sexual activity: Not on file

## 2016-08-14 DIAGNOSIS — M7541 Impingement syndrome of right shoulder: Secondary | ICD-10-CM | POA: Diagnosis not present

## 2016-08-20 DIAGNOSIS — M7541 Impingement syndrome of right shoulder: Secondary | ICD-10-CM | POA: Diagnosis not present

## 2016-08-22 DIAGNOSIS — M7541 Impingement syndrome of right shoulder: Secondary | ICD-10-CM | POA: Diagnosis not present

## 2016-08-25 DIAGNOSIS — M7541 Impingement syndrome of right shoulder: Secondary | ICD-10-CM | POA: Diagnosis not present

## 2016-08-27 DIAGNOSIS — M7541 Impingement syndrome of right shoulder: Secondary | ICD-10-CM | POA: Diagnosis not present

## 2016-09-01 DIAGNOSIS — M7541 Impingement syndrome of right shoulder: Secondary | ICD-10-CM | POA: Diagnosis not present

## 2016-09-03 DIAGNOSIS — M7541 Impingement syndrome of right shoulder: Secondary | ICD-10-CM | POA: Diagnosis not present

## 2016-09-05 DIAGNOSIS — M7541 Impingement syndrome of right shoulder: Secondary | ICD-10-CM | POA: Diagnosis not present

## 2016-09-08 DIAGNOSIS — M7541 Impingement syndrome of right shoulder: Secondary | ICD-10-CM | POA: Diagnosis not present

## 2016-09-10 DIAGNOSIS — M7541 Impingement syndrome of right shoulder: Secondary | ICD-10-CM | POA: Diagnosis not present

## 2016-09-12 DIAGNOSIS — M7541 Impingement syndrome of right shoulder: Secondary | ICD-10-CM | POA: Diagnosis not present

## 2016-09-19 ENCOUNTER — Telehealth: Payer: Self-pay

## 2016-09-19 DIAGNOSIS — Z1239 Encounter for other screening for malignant neoplasm of breast: Secondary | ICD-10-CM

## 2016-09-19 NOTE — Telephone Encounter (Signed)
Order for mammogram placed and letter sent

## 2016-09-23 DIAGNOSIS — M7541 Impingement syndrome of right shoulder: Secondary | ICD-10-CM | POA: Diagnosis not present

## 2016-09-25 DIAGNOSIS — M7541 Impingement syndrome of right shoulder: Secondary | ICD-10-CM | POA: Diagnosis not present

## 2016-10-06 DIAGNOSIS — M7541 Impingement syndrome of right shoulder: Secondary | ICD-10-CM | POA: Diagnosis not present

## 2016-12-10 DIAGNOSIS — Z01419 Encounter for gynecological examination (general) (routine) without abnormal findings: Secondary | ICD-10-CM | POA: Diagnosis not present

## 2016-12-10 DIAGNOSIS — N8112 Cystocele, lateral: Secondary | ICD-10-CM | POA: Diagnosis not present

## 2016-12-10 DIAGNOSIS — N816 Rectocele: Secondary | ICD-10-CM | POA: Diagnosis not present

## 2016-12-10 DIAGNOSIS — Z1231 Encounter for screening mammogram for malignant neoplasm of breast: Secondary | ICD-10-CM | POA: Diagnosis not present

## 2016-12-12 ENCOUNTER — Other Ambulatory Visit: Payer: Medicare Other | Admitting: Internal Medicine

## 2016-12-12 DIAGNOSIS — F419 Anxiety disorder, unspecified: Secondary | ICD-10-CM | POA: Diagnosis not present

## 2016-12-12 DIAGNOSIS — E039 Hypothyroidism, unspecified: Secondary | ICD-10-CM | POA: Diagnosis not present

## 2016-12-12 DIAGNOSIS — J309 Allergic rhinitis, unspecified: Secondary | ICD-10-CM

## 2016-12-12 DIAGNOSIS — K219 Gastro-esophageal reflux disease without esophagitis: Secondary | ICD-10-CM

## 2016-12-12 DIAGNOSIS — Z0000 Encounter for general adult medical examination without abnormal findings: Secondary | ICD-10-CM

## 2016-12-12 LAB — COMPLETE METABOLIC PANEL WITH GFR
AG RATIO: 1.9 (calc) (ref 1.0–2.5)
ALT: 18 U/L (ref 6–29)
AST: 17 U/L (ref 10–35)
Albumin: 4 g/dL (ref 3.6–5.1)
Alkaline phosphatase (APISO): 64 U/L (ref 33–130)
BUN: 11 mg/dL (ref 7–25)
CALCIUM: 8.8 mg/dL (ref 8.6–10.4)
CO2: 29 mmol/L (ref 20–32)
CREATININE: 0.72 mg/dL (ref 0.60–0.93)
Chloride: 103 mmol/L (ref 98–110)
GFR, EST AFRICAN AMERICAN: 96 mL/min/{1.73_m2} (ref >=60)
GFR, EST NON AFRICAN AMERICAN: 83 mL/min/{1.73_m2} (ref >=60)
GLUCOSE: 85 mg/dL (ref 65–99)
Globulin: 2.1 g/dL (calc) (ref 1.9–3.7)
Potassium: 4.2 mmol/L (ref 3.5–5.3)
Sodium: 139 mmol/L (ref 135–146)
TOTAL PROTEIN: 6.1 g/dL (ref 6.1–8.1)
Total Bilirubin: 0.6 mg/dL (ref 0.2–1.2)

## 2016-12-12 LAB — CBC WITH DIFFERENTIAL/PLATELET
BASOS ABS: 40 {cells}/uL (ref 0–200)
BASOS PCT: 0.8 %
EOS ABS: 265 {cells}/uL (ref 15–500)
EOS PCT: 5.3 %
HEMATOCRIT: 40.7 % (ref 35.0–45.0)
HEMOGLOBIN: 14.1 g/dL (ref 11.7–15.5)
LYMPHS ABS: 1220 {cells}/uL (ref 850–3900)
MCH: 31.5 pg (ref 27.0–33.0)
MCHC: 34.6 g/dL (ref 32.0–36.0)
MCV: 90.8 fL (ref 80.0–100.0)
MPV: 8.6 fL (ref 7.5–12.5)
Monocytes Relative: 9.9 %
NEUTROS ABS: 2980 {cells}/uL (ref 1500–7800)
Neutrophils Relative %: 59.6 %
PLATELETS: 276 10*3/uL (ref 140–400)
RBC: 4.48 10*6/uL (ref 3.80–5.10)
RDW: 11.8 % (ref 11.0–15.0)
Total Lymphocyte: 24.4 %
WBC mixed population: 495 cells/uL (ref 200–950)
WBC: 5 10*3/uL (ref 3.8–10.8)

## 2016-12-12 LAB — LIPID PANEL
Cholesterol: 176 mg/dL (ref <200)
HDL: 92 mg/dL (ref >50)
LDL CHOLESTEROL (CALC): 69 mg/dL
NON-HDL CHOLESTEROL (CALC): 84 mg/dL (ref <130)
TRIGLYCERIDES: 66 mg/dL (ref <150)
Total CHOL/HDL Ratio: 1.9 (calc) (ref <5.0)

## 2016-12-12 LAB — TSH: TSH: 0.79 m[IU]/L (ref 0.40–4.50)

## 2016-12-15 ENCOUNTER — Encounter: Payer: Self-pay | Admitting: Internal Medicine

## 2016-12-15 ENCOUNTER — Ambulatory Visit (INDEPENDENT_AMBULATORY_CARE_PROVIDER_SITE_OTHER): Payer: Medicare Other | Admitting: Internal Medicine

## 2016-12-15 VITALS — BP 126/70 | HR 62 | Temp 97.6°F | Ht 63.75 in | Wt 155.0 lb

## 2016-12-15 DIAGNOSIS — Z96641 Presence of right artificial hip joint: Secondary | ICD-10-CM

## 2016-12-15 DIAGNOSIS — Z8659 Personal history of other mental and behavioral disorders: Secondary | ICD-10-CM | POA: Diagnosis not present

## 2016-12-15 DIAGNOSIS — E039 Hypothyroidism, unspecified: Secondary | ICD-10-CM | POA: Diagnosis not present

## 2016-12-15 DIAGNOSIS — K219 Gastro-esophageal reflux disease without esophagitis: Secondary | ICD-10-CM | POA: Diagnosis not present

## 2016-12-15 DIAGNOSIS — Z23 Encounter for immunization: Secondary | ICD-10-CM

## 2016-12-15 DIAGNOSIS — B009 Herpesviral infection, unspecified: Secondary | ICD-10-CM

## 2016-12-15 DIAGNOSIS — Z0000 Encounter for general adult medical examination without abnormal findings: Secondary | ICD-10-CM

## 2016-12-15 LAB — POCT URINALYSIS DIPSTICK
Bilirubin, UA: NEGATIVE
Blood, UA: NEGATIVE
Glucose, UA: NEGATIVE
KETONES UA: NEGATIVE
Leukocytes, UA: NEGATIVE
Nitrite, UA: NEGATIVE
PH UA: 6 (ref 5.0–8.0)
PROTEIN UA: NEGATIVE
SPEC GRAV UA: 1.01 (ref 1.010–1.025)
Urobilinogen, UA: 0.2 E.U./dL

## 2016-12-15 MED ORDER — LEVOTHYROXINE SODIUM 112 MCG PO TABS: 112.0000 ug | ORAL_TABLET | Freq: Every day | ORAL | 3 refills | Status: DC

## 2016-12-15 MED ORDER — VALACYCLOVIR HCL 500 MG PO TABS: 500.0000 mg | ORAL_TABLET | Freq: Every day | ORAL | 3 refills | Status: DC

## 2016-12-15 NOTE — Progress Notes (Signed)
Subjective:    Patient ID: [name], female    DOB: 11-10-1945, 71 y.o.   MRN: [id number]  HPI 71 year old female in today for health maintenance exam and evaluation of medical issues.  Also for Medicare wellness exam.  She has a long-standing history of hypothyroidism treated with thyroid replacement medication.  History of herpes simplex type II, GE reflux, allergic rhinitis, neuropathy of right foot and anxiety.  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 Ninfa Linden.  Left breast biopsy which was benign in 1972, lumbar disc surgery at L4 1999, lumbar disc surgery at L5 in 1981.  Cholecystectomy 2004.  Colonoscopy by Dr. Maurene Capes November 2007 with 10-year follow-up recommended.  Has GYN for GYN exam.  She has a healthcare power of attorney and is provided [place] with those documents.  She is allergic to penicillin it causes anaphylaxis.  Social history: Husband is retired.  Formerly operated [employer] in [place].  Lost stepson to suicide earlier this year.  She has 2 adult daughters from a previous marriage.  She does not smoke.  Social alcohol consumption.  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  She and her husband reside in [facility].  Family history: Father died at age 52 of an MI.  Mother with history of neurological problems and heart disease.  One brother died at age 74 of congestive heart failure secondary to rheumatic fever complications.  One brother in good health.  One sister with hypertension and obesity.  Another sister with hypertension and obesity.    Review of Systems  Constitutional: Negative.   All other systems reviewed and are negative.      Objective:   Physical Exam  Constitutional: She is oriented to person, place, and time. She appears well-developed and well-nourished. No distress.  HENT:  Head: Normocephalic and atraumatic.  Right Ear: External ear normal.  Left Ear: External ear normal.    Mouth/Throat: Oropharynx is clear and moist. No oropharyngeal exudate.  Eyes: Pupils are equal, round, and reactive to light. Conjunctivae and EOM are normal. Right eye exhibits no discharge. Left eye exhibits no discharge.  Neck: Neck supple. No JVD present. No thyromegaly present.  Cardiovascular: Normal rate, normal heart sounds and intact distal pulses.   No murmur heard. Pulmonary/Chest: Effort normal and breath sounds normal. She has no wheezes. She has no rales.  Breasts normal female without masses  Abdominal: She exhibits no distension and no mass. There is no tenderness. There is no rebound and no guarding.  Genitourinary:  Genitourinary Comments: Deferred to GYN  Musculoskeletal: She exhibits no edema.  Lymphadenopathy:    She has no cervical adenopathy.  Neurological: She is alert and oriented to person, place, and time. She has normal reflexes. No cranial nerve deficit.  Skin: Skin is warm and dry. No rash noted. She is not diaphoretic.  Psychiatric: She has a normal mood and affect. Her behavior is normal. Judgment and thought content normal.  Vitals reviewed.         Assessment & Plan:  Normal health maintenance exam  Hypothyroidism  History of GE reflux  History of allergic rhinitis  Herpes simplex type II treated with Valtrex  History of anxiety  Status post right hip arthroplasty 2014  History of right foot peripheral neuropathy status post 2 lumbar disc surgeries  Plan: Her general health is excellent.  Lab work reviewed and is entirely within normal limits.  We will continue  with current dose of thyroid replacement.  Valtrex refilled.  Return in 1 year or as needed.  Subjective:   Patient presents for Medicare Annual/Subsequent preventive examination.  Review Past Medical/Family/Social:   Risk Factors  Current exercise habits:  Dietary issues discussed:   Cardiac risk factors:  Depression Screen  (Note: if answer to either of the following  is "Yes", a more complete depression screening is indicated)   Over the past two weeks, have you felt down, depressed or hopeless?  Not usually but situational stress with step son's passing Over the past two weeks, have you felt little interest or pleasure in doing things?  See above Have you lost interest or pleasure in daily life? No Do you often feel hopeless? No Do you cry easily over simple problems? No   Activities of Daily Living  In your present state of health, do you have any difficulty performing the following activities?:   Driving? No  Managing money? No  Feeding yourself? No  Getting from bed to chair? No  Climbing a flight of stairs? No  Preparing food and eating?: No  Bathing or showering? No  Getting dressed: No  Getting to the toilet? No  Using the toilet:No  Moving around from place to place: No  In the past year have you fallen or had a near fall?:No  Are you sexually active?  he has Do you have more than one partner? No   Hearing Difficulties: No  Do you often ask people to speak up or repeat themselves?  Yes Do you experience ringing or noises in your ears?  Yes Do you have difficulty understanding soft or whispered voices?  Yes Do you feel that you have a problem with memory? No Do you often misplace items? No    Home Safety:  Do you have a smoke alarm at your residence? Yes Do you have grab bars in the bathroom?  Yes Do you have throw rugs in your house?  Yes   Cognitive Testing  Alert? Yes Normal Appearance?Yes  Oriented to person? Yes Place? Yes  Time? Yes  Recall of three objects? Yes  Can perform simple calculations? Yes  Displays appropriate judgment?Yes  Can read the correct time from a watch face?Yes   List the Names of Other Physician/Practitioners you currently use:  See referral list for the physicians patient is currently seeing.     Review of Systems: See above   Objective:     General appearance: Appears younger than  stated age  Head: Normocephalic, without obvious abnormality, atraumatic  Eyes: conj clear, EOMi PEERLA  Ears: normal TM's and external ear canals both ears  Nose: Nares normal. Septum midline. Mucosa normal. No drainage or sinus tenderness.  Throat: lips, mucosa, and tongue normal; teeth and gums normal  Neck: no adenopathy, no carotid bruit, no JVD, supple, symmetrical, trachea midline and thyroid not enlarged, symmetric, no tenderness/mass/nodules  No CVA tenderness.  Lungs: clear to auscultation bilaterally  Breasts: normal appearance, no masses or tenderness Heart: regular rate and rhythm, S1, S2 normal, no murmur, click, rub or gallop  Abdomen: soft, non-tender; bowel sounds normal; no masses, no organomegaly  Musculoskeletal: ROM normal in all joints, no crepitus, no deformity, Normal muscle strengthen. Back  is symmetric, no curvature. Skin: Skin color, texture, turgor normal. No rashes or lesions  Lymph nodes: Cervical, supraclavicular, and axillary nodes normal.  Neurologic: CN 2 -12 Normal, Normal symmetric reflexes. Normal coordination and gait  Psych: Alert & Oriented x  3, Mood appear stable.    Assessment:    Annual wellness medicare exam   Plan:    During the course of the visit the patient was educated and counseled about appropriate screening and preventive services including:   See above     Patient Instructions (the written plan) was given to the patient.  Medicare Attestation  I have personally reviewed:  The patient's medical and social history  Their use of alcohol, tobacco or illicit drugs  Their current medications and supplements  The patient's functional ability including ADLs,fall risks, home safety risks, cognitive, and hearing and visual impairment  Diet and physical activities  Evidence for depression or mood disorders  The patient's weight, height, BMI, and visual acuity have been recorded in the chart. I have made referrals, counseling, and  provided education to the patient based on review of the above and I have provided the patient with a written personalized care plan for preventive services.

## 2016-12-16 LAB — CBC WITH DIFFERENTIAL/PLATELET

## 2016-12-16 LAB — COMPLETE METABOLIC PANEL WITH GFR

## 2016-12-16 LAB — LIPID PANEL

## 2016-12-16 LAB — TSH

## 2017-01-07 DIAGNOSIS — D4981 Neoplasm of unspecified behavior of retina and choroid: Secondary | ICD-10-CM | POA: Diagnosis not present

## 2017-01-07 DIAGNOSIS — H2513 Age-related nuclear cataract, bilateral: Secondary | ICD-10-CM | POA: Diagnosis not present

## 2017-01-07 DIAGNOSIS — H04123 Dry eye syndrome of bilateral lacrimal glands: Secondary | ICD-10-CM | POA: Diagnosis not present

## 2017-01-07 DIAGNOSIS — H524 Presbyopia: Secondary | ICD-10-CM | POA: Diagnosis not present

## 2017-01-07 DIAGNOSIS — H52223 Regular astigmatism, bilateral: Secondary | ICD-10-CM | POA: Diagnosis not present

## 2017-01-11 NOTE — Patient Instructions (Signed)
It was a pleasure to see you today.  Lab work is within normal limits.  Flu vaccine given.  Continue same medications and return in 1 year or as needed.

## 2017-02-24 DIAGNOSIS — J0100 Acute maxillary sinusitis, unspecified: Secondary | ICD-10-CM | POA: Diagnosis not present

## 2017-02-24 DIAGNOSIS — J0110 Acute frontal sinusitis, unspecified: Secondary | ICD-10-CM | POA: Diagnosis not present

## 2017-02-25 ENCOUNTER — Ambulatory Visit: Payer: Medicare Other | Admitting: Internal Medicine

## 2017-03-26 ENCOUNTER — Ambulatory Visit (INDEPENDENT_AMBULATORY_CARE_PROVIDER_SITE_OTHER): Payer: Medicare Other | Admitting: Internal Medicine

## 2017-03-26 VITALS — BP 126/76 | HR 91 | Ht 63.75 in | Wt 154.0 lb

## 2017-03-26 DIAGNOSIS — F329 Major depressive disorder, single episode, unspecified: Secondary | ICD-10-CM | POA: Diagnosis not present

## 2017-03-26 DIAGNOSIS — F439 Reaction to severe stress, unspecified: Secondary | ICD-10-CM

## 2017-03-26 DIAGNOSIS — F419 Anxiety disorder, unspecified: Secondary | ICD-10-CM | POA: Diagnosis not present

## 2017-03-26 DIAGNOSIS — G4700 Insomnia, unspecified: Secondary | ICD-10-CM

## 2017-03-26 MED ORDER — BUPROPION HCL ER (XL) 150 MG PO TB24: 150.0000 mg | ORAL_TABLET | Freq: Every day | ORAL | 1 refills | Status: DC

## 2017-04-04 ENCOUNTER — Encounter: Payer: Self-pay | Admitting: Internal Medicine

## 2017-04-04 NOTE — Patient Instructions (Signed)
Xanax as needed for insomnia and anxiety at bedtime.  Wellbutrin XL 150 mg daily.  Follow-up in 4 weeks.  Return to counseling.

## 2017-04-04 NOTE — Progress Notes (Signed)
   Subjective:    Patient ID: [name], female    DOB: 1945-08-05, 72 y.o.   MRN: [id number]  HPI  Patient in today to discuss symptoms of depression.  It has been a tough year or so for her.  Her husband's son died by suicide in [place].  Her nephew died of cancer.  They own a home in [place], [place].  Recently there has been a situation with her husband and a female neighbor there that has caused patient some concern, see above and this was discussed at length today.  She is going to go back to counseling.  She had a similar situation in 2015 with another female neighbor there who expressed interest in her husband.  She and her husband went to marital counseling and that helped.  That situation improved.  She is ambivalent about spending time in [place].    Review of Systems see above-having issues with insomnia and anxiety     Objective:   Physical Exam She was not examined today but spent 25 minutes speaking with her about her concerns     Assessment & Plan:  Situational stress  Anxiety depression  Plan: Agreed that she should return to counseling.  Start Wellbutrin XL 150 mg daily which she is taken previously.  Xanax 0.5 mg at bedtime as needed for sleep.

## 2017-04-23 ENCOUNTER — Ambulatory Visit (INDEPENDENT_AMBULATORY_CARE_PROVIDER_SITE_OTHER): Payer: Medicare Other | Admitting: Internal Medicine

## 2017-04-23 ENCOUNTER — Encounter: Payer: Self-pay | Admitting: Internal Medicine

## 2017-04-23 VITALS — BP 156/80 | HR 74 | Ht 63.75 in | Wt 150.0 lb

## 2017-04-23 DIAGNOSIS — F419 Anxiety disorder, unspecified: Secondary | ICD-10-CM | POA: Diagnosis not present

## 2017-04-23 DIAGNOSIS — R5383 Other fatigue: Secondary | ICD-10-CM

## 2017-04-23 DIAGNOSIS — F329 Major depressive disorder, single episode, unspecified: Secondary | ICD-10-CM

## 2017-04-23 DIAGNOSIS — F32A Depression, unspecified: Secondary | ICD-10-CM

## 2017-04-23 DIAGNOSIS — F439 Reaction to severe stress, unspecified: Secondary | ICD-10-CM | POA: Diagnosis not present

## 2017-04-23 DIAGNOSIS — E039 Hypothyroidism, unspecified: Secondary | ICD-10-CM | POA: Diagnosis not present

## 2017-04-23 DIAGNOSIS — G609 Hereditary and idiopathic neuropathy, unspecified: Secondary | ICD-10-CM | POA: Diagnosis not present

## 2017-04-23 NOTE — Patient Instructions (Signed)
TSH and B12 levels checked.  Continue Wellbutrin at current dose.  Physical exam due in October.

## 2017-04-23 NOTE — Progress Notes (Signed)
   Subjective:    Patient ID: [name], female    DOB: 1945-12-07, 72 y.o.   MRN: [id number]  HPI At last visit was started on Wellbutrin for depression.  She seems to be feeling better.  Has been to counseling.  Issues discussed at length regarding reason for depression.  Husband has apologized to her.  She is planning to go to [facility] at the end of the month stay for a few days.  She is a little apprehensive about that.  Has been feeling some fatigue.  Asking about taking B12 injections.  We are going to check B12 level today as well as TSH.  Dose of Wellbutrin seems to be adequate.  She is been going back to the gym and working out doing some things with her friends which I think she really needed.  Counselor is made some good suggestions for her.    Review of Systems see above     Objective:   Physical Exam  No thyromegaly.  B12 and TSH pending.  She looks great and is in a much better place than at last visit.      Assessment & Plan:  Depression-improved on Wellbutrin  Situational stress-see above  Hypothyroidism-TSH being checked today  Fatigue-check B12 and TSH.  Plan: It is okay for her to take oral B12 supplement but I would like to see motor level is first.  No reason to suspect B12 deficiency.  No history of gastric surgery.  25 minutes spent with patient discussing situational stress and issues with fatigue.  Physical exam is due October 2019.  We can certainly see her sooner if necessary.

## 2017-04-24 ENCOUNTER — Other Ambulatory Visit: Payer: Self-pay | Admitting: Internal Medicine

## 2017-04-24 LAB — TSH: TSH: 0.36 mIU/L — ABNORMAL LOW (ref 0.40–4.50)

## 2017-04-24 LAB — VITAMIN B12: VITAMIN B 12: 447 pg/mL (ref 200–1100)

## 2017-06-22 ENCOUNTER — Encounter (INDEPENDENT_AMBULATORY_CARE_PROVIDER_SITE_OTHER): Payer: Self-pay | Admitting: Orthopaedic Surgery

## 2017-06-22 ENCOUNTER — Ambulatory Visit (INDEPENDENT_AMBULATORY_CARE_PROVIDER_SITE_OTHER): Payer: Medicare Other

## 2017-06-22 ENCOUNTER — Ambulatory Visit (INDEPENDENT_AMBULATORY_CARE_PROVIDER_SITE_OTHER): Payer: Medicare Other | Admitting: Orthopaedic Surgery

## 2017-06-22 ENCOUNTER — Other Ambulatory Visit: Payer: Self-pay | Admitting: Internal Medicine

## 2017-06-22 DIAGNOSIS — M25551 Pain in right hip: Secondary | ICD-10-CM | POA: Diagnosis not present

## 2017-06-22 DIAGNOSIS — Z96649 Presence of unspecified artificial hip joint: Secondary | ICD-10-CM | POA: Diagnosis not present

## 2017-06-22 NOTE — Progress Notes (Signed)
The patient is a very pleasant 72 year old female well-known to me.  She is 5 years status post a right total hip arthroplasty.  She has had no pain in that hip at all until 9 days ago after she went to the physical therapy that involved rolfi she developed severe pain in her right thigh and groin area right after that procedure.  On exam I can easily put her right hip through full range of motion with no pain at all today.  Compressing the hip causes no pain.  There is no pain over the thigh or the trochanteric area for the IT band.  An AP and lateral of the right hip including AP of the pelvis shows well-seated total hip arthroplasty with no evidence of loosening or other acute findings or complicating features.  She is walking without a limp and I gave her reassurance that I think her hip looks fine.  Obviously if she continues to have any issues at all she will let us know.  I will have her take 600 800 mg 2-3 times a day with meals for at least the next week.  She will follow-up as needed unless this becomes problematic.  All questions concerns were answered and addressed.

## 2017-06-22 NOTE — Telephone Encounter (Signed)
Refill x 6 months 

## 2017-06-23 ENCOUNTER — Other Ambulatory Visit (INDEPENDENT_AMBULATORY_CARE_PROVIDER_SITE_OTHER): Payer: Self-pay

## 2017-06-23 ENCOUNTER — Telehealth (INDEPENDENT_AMBULATORY_CARE_PROVIDER_SITE_OTHER): Payer: Self-pay | Admitting: Orthopaedic Surgery

## 2017-06-23 MED ORDER — METHYLPREDNISOLONE 4 MG PO TABS: ORAL_TABLET | ORAL | 0 refills | Status: DC

## 2017-06-23 NOTE — Telephone Encounter (Signed)
Please advise 

## 2017-06-23 NOTE — Telephone Encounter (Signed)
Sent to pharmacy 

## 2017-06-23 NOTE — Telephone Encounter (Signed)
Please send in a medrol dose pack.  I thought I did yesterday.  Please check into it. Thanks

## 2017-06-23 NOTE — Telephone Encounter (Signed)
Patient called, she was seen yesterday and Dr. Ninfa Linden suggested a steroid and she would like for it to be called into her pharmacy. She was trying the 8 ibuprofen, but it was upsetting her stomach. CB # [phone]

## 2017-08-29 ENCOUNTER — Other Ambulatory Visit: Payer: Self-pay | Admitting: Internal Medicine

## 2017-11-17 ENCOUNTER — Other Ambulatory Visit: Payer: Self-pay | Admitting: Internal Medicine

## 2017-12-07 DIAGNOSIS — Z23 Encounter for immunization: Secondary | ICD-10-CM | POA: Diagnosis not present

## 2017-12-15 ENCOUNTER — Other Ambulatory Visit: Payer: Medicare Other | Admitting: Internal Medicine

## 2017-12-18 ENCOUNTER — Encounter: Payer: Medicare Other | Admitting: Internal Medicine

## 2017-12-23 ENCOUNTER — Other Ambulatory Visit: Payer: Self-pay | Admitting: Internal Medicine

## 2017-12-23 DIAGNOSIS — B009 Herpesviral infection, unspecified: Secondary | ICD-10-CM

## 2017-12-23 DIAGNOSIS — E039 Hypothyroidism, unspecified: Secondary | ICD-10-CM

## 2017-12-23 DIAGNOSIS — Z8659 Personal history of other mental and behavioral disorders: Secondary | ICD-10-CM

## 2017-12-23 DIAGNOSIS — Z0000 Encounter for general adult medical examination without abnormal findings: Secondary | ICD-10-CM

## 2017-12-23 DIAGNOSIS — K219 Gastro-esophageal reflux disease without esophagitis: Secondary | ICD-10-CM

## 2017-12-23 DIAGNOSIS — Z96641 Presence of right artificial hip joint: Secondary | ICD-10-CM

## 2017-12-28 ENCOUNTER — Encounter: Payer: Self-pay | Admitting: Internal Medicine

## 2017-12-28 DIAGNOSIS — Z1231 Encounter for screening mammogram for malignant neoplasm of breast: Secondary | ICD-10-CM | POA: Diagnosis not present

## 2017-12-28 LAB — HM MAMMOGRAPHY

## 2017-12-29 ENCOUNTER — Other Ambulatory Visit: Payer: Medicare Other | Admitting: Internal Medicine

## 2017-12-29 DIAGNOSIS — K219 Gastro-esophageal reflux disease without esophagitis: Secondary | ICD-10-CM

## 2017-12-29 DIAGNOSIS — B009 Herpesviral infection, unspecified: Secondary | ICD-10-CM

## 2017-12-29 DIAGNOSIS — Z96641 Presence of right artificial hip joint: Secondary | ICD-10-CM

## 2017-12-29 DIAGNOSIS — Z0000 Encounter for general adult medical examination without abnormal findings: Secondary | ICD-10-CM | POA: Diagnosis not present

## 2017-12-29 DIAGNOSIS — Z8659 Personal history of other mental and behavioral disorders: Secondary | ICD-10-CM | POA: Diagnosis not present

## 2017-12-29 DIAGNOSIS — E039 Hypothyroidism, unspecified: Secondary | ICD-10-CM | POA: Diagnosis not present

## 2017-12-29 LAB — LIPID PANEL
CHOLESTEROL: 194 mg/dL (ref <200)
HDL: 82 mg/dL (ref >50)
LDL Cholesterol (Calc): 95 mg/dL (calc)
NON-HDL CHOLESTEROL (CALC): 112 mg/dL (ref <130)
TRIGLYCERIDES: 81 mg/dL (ref <150)
Total CHOL/HDL Ratio: 2.4 (calc) (ref <5.0)

## 2017-12-29 LAB — CBC WITH DIFFERENTIAL/PLATELET
BASOS ABS: 58 {cells}/uL (ref 0–200)
Basophils Relative: 0.9 %
Eosinophils Absolute: 326 cells/uL (ref 15–500)
Eosinophils Relative: 5.1 %
HCT: 42.1 % (ref 35.0–45.0)
Hemoglobin: 14.6 g/dL (ref 11.7–15.5)
Lymphs Abs: 1152 cells/uL (ref 850–3900)
MCH: 31.6 pg (ref 27.0–33.0)
MCHC: 34.7 g/dL (ref 32.0–36.0)
MCV: 91.1 fL (ref 80.0–100.0)
MPV: 8.8 fL (ref 7.5–12.5)
Monocytes Relative: 8.9 %
NEUTROS PCT: 67.1 %
Neutro Abs: 4294 cells/uL (ref 1500–7800)
PLATELETS: 282 10*3/uL (ref 140–400)
RBC: 4.62 10*6/uL (ref 3.80–5.10)
RDW: 12 % (ref 11.0–15.0)
Total Lymphocyte: 18 %
WBC: 6.4 10*3/uL (ref 3.8–10.8)
WBCMIX: 570 {cells}/uL (ref 200–950)

## 2017-12-29 LAB — COMPLETE METABOLIC PANEL WITH GFR
AG RATIO: 2.1 (calc) (ref 1.0–2.5)
ALBUMIN MSPROF: 4.2 g/dL (ref 3.6–5.1)
ALT: 21 U/L (ref 6–29)
AST: 25 U/L (ref 10–35)
Alkaline phosphatase (APISO): 74 U/L (ref 33–130)
BUN: 14 mg/dL (ref 7–25)
CO2: 29 mmol/L (ref 20–32)
Calcium: 9.3 mg/dL (ref 8.6–10.4)
Chloride: 103 mmol/L (ref 98–110)
Creat: 0.65 mg/dL (ref 0.60–0.93)
GFR, EST AFRICAN AMERICAN: 101 mL/min/{1.73_m2} (ref >=60)
GFR, Est Non African American: 87 mL/min/{1.73_m2} (ref >=60)
Globulin: 2 g/dL (calc) (ref 1.9–3.7)
Glucose, Bld: 75 mg/dL (ref 65–99)
POTASSIUM: 4.5 mmol/L (ref 3.5–5.3)
Sodium: 140 mmol/L (ref 135–146)
TOTAL PROTEIN: 6.2 g/dL (ref 6.1–8.1)
Total Bilirubin: 0.8 mg/dL (ref 0.2–1.2)

## 2017-12-29 LAB — TSH: TSH: 0.27 mIU/L — ABNORMAL LOW (ref 0.40–4.50)

## 2017-12-30 DIAGNOSIS — Z780 Asymptomatic menopausal state: Secondary | ICD-10-CM | POA: Diagnosis not present

## 2017-12-30 DIAGNOSIS — N8112 Cystocele, lateral: Secondary | ICD-10-CM | POA: Diagnosis not present

## 2017-12-30 DIAGNOSIS — Z13820 Encounter for screening for osteoporosis: Secondary | ICD-10-CM | POA: Diagnosis not present

## 2017-12-30 DIAGNOSIS — N816 Rectocele: Secondary | ICD-10-CM | POA: Diagnosis not present

## 2017-12-30 DIAGNOSIS — B009 Herpesviral infection, unspecified: Secondary | ICD-10-CM | POA: Diagnosis not present

## 2017-12-30 DIAGNOSIS — Z01419 Encounter for gynecological examination (general) (routine) without abnormal findings: Secondary | ICD-10-CM | POA: Diagnosis not present

## 2017-12-31 ENCOUNTER — Encounter: Payer: Medicare Other | Admitting: Internal Medicine

## 2018-01-01 ENCOUNTER — Encounter: Payer: Self-pay | Admitting: Internal Medicine

## 2018-01-01 ENCOUNTER — Ambulatory Visit (INDEPENDENT_AMBULATORY_CARE_PROVIDER_SITE_OTHER): Payer: Medicare Other | Admitting: Internal Medicine

## 2018-01-01 VITALS — BP 146/80 | HR 78 | Temp 98.3°F | Ht 63.75 in | Wt 146.0 lb

## 2018-01-01 DIAGNOSIS — R030 Elevated blood-pressure reading, without diagnosis of hypertension: Secondary | ICD-10-CM

## 2018-01-01 DIAGNOSIS — Z8619 Personal history of other infectious and parasitic diseases: Secondary | ICD-10-CM | POA: Diagnosis not present

## 2018-01-01 DIAGNOSIS — E039 Hypothyroidism, unspecified: Secondary | ICD-10-CM

## 2018-01-01 DIAGNOSIS — Z23 Encounter for immunization: Secondary | ICD-10-CM | POA: Diagnosis not present

## 2018-01-01 DIAGNOSIS — Z96641 Presence of right artificial hip joint: Secondary | ICD-10-CM

## 2018-01-01 DIAGNOSIS — K219 Gastro-esophageal reflux disease without esophagitis: Secondary | ICD-10-CM

## 2018-01-01 DIAGNOSIS — F439 Reaction to severe stress, unspecified: Secondary | ICD-10-CM | POA: Diagnosis not present

## 2018-01-01 DIAGNOSIS — Z8659 Personal history of other mental and behavioral disorders: Secondary | ICD-10-CM

## 2018-01-01 DIAGNOSIS — Z0000 Encounter for general adult medical examination without abnormal findings: Secondary | ICD-10-CM

## 2018-01-01 DIAGNOSIS — R7989 Other specified abnormal findings of blood chemistry: Secondary | ICD-10-CM

## 2018-01-01 LAB — POCT URINALYSIS DIPSTICK
Appearance: NORMAL
Bilirubin, UA: NEGATIVE
GLUCOSE UA: NEGATIVE
KETONES UA: NEGATIVE
Leukocytes, UA: NEGATIVE
Nitrite, UA: NEGATIVE
Odor: NORMAL
PROTEIN UA: NEGATIVE
RBC UA: NEGATIVE
SPEC GRAV UA: 1.01 (ref 1.010–1.025)
Urobilinogen, UA: 0.2 E.U./dL
pH, UA: 6.5 (ref 5.0–8.0)

## 2018-01-01 NOTE — Progress Notes (Signed)
Subjective:    Patient ID: [name], female    DOB: [birth year], 72 y.o.   MRN: [id number]  HPI pleasant 72 year old white female in today for Medicare wellness, health maintenance exam and evaluation of medical issues.  Long-standing history of hypothyroidism treated with thyroid replacement medication.  History of herpes simplex type II, GE reflux, allergic rhinitis, anxiety, neuropathy of right foot.  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 Ninfa Linden.  Left breast biopsy which was benign in 1972, lumbar disc surgery at L4 1999, lumbar disc surgery at L5 in 1981.  Cholecystectomy 2004.  Colonoscopy by Dr. Olevia Perches November 2007 with 10-year follow-up recommended.  Has GYN for GYN exam.  She has a healthcare power of attorney and has provided [place] with those documents.  She is allergic to penicillin-causes anaphylaxis.  Social history: Her husband is retired Conservation officer, historic buildings and formally was Teacher, English as a foreign language, will code 3 and [place].  Patient lost a stepson to suicide in 2018.  She has 2 adult daughters from a previous marriage.  She does not smoke.  Social alcohol consumption.  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  She and her husband reside in [place] [facility].  Family history: Father died at age 82 of an MI.  Mother with history of neurological problems and heart disease.  One brother died at age 34 of congestive heart failure secondary to rheumatic fever complications.  One brother in good health.  One sister with hypertension and obesity.  Another sister with hypertension and obesity.    Review of Systems  Constitutional: Negative.   All other systems reviewed and are negative.      Objective:   Physical Exam  Constitutional: She is oriented to person, place, and time. She appears well-developed and well-nourished. No distress.  HENT:  Head: Normocephalic and atraumatic.  Right Ear: External ear normal.  Left Ear: External ear normal.  Mouth/Throat:  Oropharynx is clear and moist. No oropharyngeal exudate.  Eyes: Pupils are equal, round, and reactive to light. Conjunctivae are normal. Right eye exhibits no discharge. Left eye exhibits no discharge.  Neck: Neck supple. No JVD present. No thyromegaly present.  Cardiovascular: Normal rate, regular rhythm and normal heart sounds.  No murmur heard. Pulmonary/Chest: Effort normal and breath sounds normal. No stridor. No respiratory distress. She has no wheezes. She has no rales.  Abdominal: Soft. Bowel sounds are normal. She exhibits no distension and no mass. There is no tenderness. There is no rebound and no guarding.  Genitourinary:  Genitourinary Comments: Deferred to GYN  Musculoskeletal: She exhibits no edema.  Lymphadenopathy:    She has no cervical adenopathy.  Neurological: She is alert and oriented to person, place, and time. She displays normal reflexes. No cranial nerve deficit or sensory deficit. She exhibits normal muscle tone. Coordination normal.  Skin: Skin is warm and dry. She is not diaphoretic.  Psychiatric: She has a normal mood and affect. Her behavior is normal. Judgment and thought content normal.  Vitals reviewed.         Assessment & Plan:  Normal health maintenance exam  Hypothyroidism- TSH is low.  This seems odd.  Repeat in December.  No change in dose.  GE reflux-takes PPI  History of allergic rhinitis  Herpes simplex type II treated with Valtrex  History of anxiety  Status post right hip arthroplasty 2014  History of right foot peripheral neuropathy status post 2 lumbar disc surgeries  Elevated blood pressure reading-recheck  in 4 weeks  Situational stress discussed and has improved  Her general health is excellent.  Lab work reviewed and is entirely within normal limits.  Return in 1 year or as needed.  Subjective:   Patient presents for Medicare Annual/Subsequent preventive examination.  Review Past Medical/Family/Social: See  above  Risk Factors  Current exercise habits: Exercises and watches her diet Dietary issues discussed: Low-fat low carbohydrate  Cardiac risk factors: Family history  Depression Screen  (Note: if answer to either of the following is "Yes", a more complete depression screening is indicated)   Over the past two weeks, have you felt down, depressed or hopeless? No  Over the past two weeks, have you felt little interest or pleasure in doing things? No Have you lost interest or pleasure in daily life? No Do you often feel hopeless? No Do you cry easily over simple problems? No   Activities of Daily Living  In your present state of health, do you have any difficulty performing the following activities?:   Driving? No  Managing money? No  Feeding yourself? No  Getting from bed to chair? No  Climbing a flight of stairs? No  Preparing food and eating?: No  Bathing or showering? No  Getting dressed: No  Getting to the toilet? No  Using the toilet:No  Moving around from place to place: No  In the past year have you fallen or had a near fall?:No  Are you sexually active?  Yes Do you have more than one partner? No   Hearing Difficulties: No  Do you often ask people to speak up or repeat themselves?  Sometimes Do you experience ringing or noises in your ears?  Yes Do you have difficulty understanding soft or whispered voices?  Sometimes Do you feel that you have a problem with memory? No Do you often misplace items? No    Home Safety:  Do you have a smoke alarm at your residence? Yes Do you have grab bars in the bathroom?  Yes Do you have throw rugs in your house?  Yes   Cognitive Testing  Alert? Yes Normal Appearance?Yes  Oriented to person? Yes Place? Yes  Time? Yes  Recall of three objects? Yes  Can perform simple calculations? Yes  Displays appropriate judgment?Yes  Can read the correct time from a watch face?Yes   List the Names of Other Physician/Practitioners you  currently use:  See referral list for the physicians patient is currently seeing.     Review of Systems: See above   Objective:     General appearance: Appears younger than stated age and trim Head: Normocephalic, without obvious abnormality, atraumatic  Eyes: conj clear, EOMi PEERLA  Ears: normal TM's and external ear canals both ears  Nose: Nares normal. Septum midline. Mucosa normal. No drainage or sinus tenderness.  Throat: lips, mucosa, and tongue normal; teeth and gums normal  Neck: no adenopathy, no carotid bruit, no JVD, supple, symmetrical, trachea midline and thyroid not enlarged, symmetric, no tenderness/mass/nodules  No CVA tenderness.  Lungs: clear to auscultation bilaterally  Breasts: normal appearance, no masses or tenderness Heart: regular rate and rhythm, S1, S2 normal, no murmur, click, rub or gallop  Abdomen: soft, non-tender; bowel sounds normal; no masses, no organomegaly  Musculoskeletal: ROM normal in all joints, no crepitus, no deformity, Normal muscle strengthen. Back  is symmetric, no curvature. Skin: Skin color, texture, turgor normal. No rashes or lesions  Lymph nodes: Cervical, supraclavicular, and axillary nodes normal.  Neurologic: CN  2 -12 Normal, Normal symmetric reflexes. Normal coordination and gait  Psych: Alert & Oriented x 3, Mood appear stable.    Assessment:    Annual wellness medicare exam   Plan:    During the course of the visit the patient was educated and counseled about appropriate screening and preventive services including:  Annual mammogram  Annual flu vaccine  Recommend Shingrix vaccine  Pneumococcal vaccines up-to-date  Tdap given      Patient Instructions (the written plan) was given to the patient.  Medicare Attestation  I have personally reviewed:  The patient's medical and social history  Their use of alcohol, tobacco or illicit drugs  Their current medications and supplements  The patient's functional  ability including ADLs,fall risks, home safety risks, cognitive, and hearing and visual impairment  Diet and physical activities  Evidence for depression or mood disorders  The patient's weight, height, BMI, and visual acuity have been recorded in the chart. I have made referrals, counseling, and provided education to the patient based on review of the above and I have provided the patient with a written personalized care plan for preventive services.

## 2018-01-03 DIAGNOSIS — Z780 Asymptomatic menopausal state: Secondary | ICD-10-CM | POA: Insufficient documentation

## 2018-01-07 DIAGNOSIS — M85852 Other specified disorders of bone density and structure, left thigh: Secondary | ICD-10-CM | POA: Diagnosis not present

## 2018-01-07 DIAGNOSIS — Z780 Asymptomatic menopausal state: Secondary | ICD-10-CM | POA: Diagnosis not present

## 2018-01-12 DIAGNOSIS — D4981 Neoplasm of unspecified behavior of retina and choroid: Secondary | ICD-10-CM | POA: Diagnosis not present

## 2018-01-12 DIAGNOSIS — H2513 Age-related nuclear cataract, bilateral: Secondary | ICD-10-CM | POA: Diagnosis not present

## 2018-01-12 DIAGNOSIS — H524 Presbyopia: Secondary | ICD-10-CM | POA: Diagnosis not present

## 2018-01-12 DIAGNOSIS — H04123 Dry eye syndrome of bilateral lacrimal glands: Secondary | ICD-10-CM | POA: Diagnosis not present

## 2018-01-13 NOTE — Patient Instructions (Addendum)
Situational stress discussed.  Tdap vaccine given.  Return in 4 weeks for blood pressure check.  Check TSH in December.

## 2018-01-26 ENCOUNTER — Encounter: Payer: Self-pay | Admitting: Internal Medicine

## 2018-01-26 ENCOUNTER — Ambulatory Visit (INDEPENDENT_AMBULATORY_CARE_PROVIDER_SITE_OTHER): Payer: Medicare Other | Admitting: Internal Medicine

## 2018-01-26 VITALS — BP 130/80 | HR 86 | Temp 98.1°F | Ht 63.75 in | Wt 146.0 lb

## 2018-01-26 DIAGNOSIS — E2839 Other primary ovarian failure: Secondary | ICD-10-CM

## 2018-01-26 DIAGNOSIS — M8580 Other specified disorders of bone density and structure, unspecified site: Secondary | ICD-10-CM | POA: Diagnosis not present

## 2018-01-26 DIAGNOSIS — R030 Elevated blood-pressure reading, without diagnosis of hypertension: Secondary | ICD-10-CM

## 2018-01-26 DIAGNOSIS — R7989 Other specified abnormal findings of blood chemistry: Secondary | ICD-10-CM | POA: Diagnosis not present

## 2018-01-26 DIAGNOSIS — E039 Hypothyroidism, unspecified: Secondary | ICD-10-CM | POA: Diagnosis not present

## 2018-01-26 MED ORDER — LEVOTHYROXINE SODIUM 100 MCG PO TABS: 100.0000 ug | ORAL_TABLET | Freq: Every day | ORAL | 3 refills | Status: DC

## 2018-01-26 NOTE — Progress Notes (Signed)
   Subjective:    Patient ID: [name], female    DOB: 12-Feb-1946, 72 y.o.   MRN: [id number]  HPI 72 year old Female for follow up on elevated blood pressure.  At last visit in October, blood pressure was 146/80 which was highly unusual for her.  She brings in multiple blood pressure readings which are very acceptable.  At home blood pressure has basically ranged from 1 43-5 36 systolically and 68-6 diastolically.  There were couple of days where her blood pressure was elevated due to restless sleep at 139/68 and due to attending a funeral at 148/78.  Patient asked about having vitamin D checked.  Level is excellent at 47.  She had DEXA scan October 2019 and T score was -1.3 in left femoral neck.  This was done in [facility] and an imaging center operated by [facility].  In October TSH was low at 0.27.  Review of Systems see above     Objective:   Physical Exam  She was not examined today.  Spent 10 minutes reviewing with her her blood pressure readings which she took at home.  Reviewed bone density study.  Vitamin D level was drawn.      Assessment & Plan:  Mild osteopenia.  T score -1.3  No evidence of vitamin D deficiency based on vitamin D level  Elevated blood pressure reading-overall blood pressure is within normal limits based on multiple blood pressure readings patient brings in from home  Low TSH-recheck in January  Plan: Follow-up in January with repeat TSH.

## 2018-01-27 LAB — VITAMIN D 25 HYDROXY (VIT D DEFICIENCY, FRACTURES): VIT D 25 HYDROXY: 47 ng/mL (ref 30–100)

## 2018-02-03 ENCOUNTER — Other Ambulatory Visit: Payer: Self-pay

## 2018-02-07 ENCOUNTER — Encounter: Payer: Self-pay | Admitting: Internal Medicine

## 2018-02-07 NOTE — Patient Instructions (Addendum)
It was a pleasure to see you today.  Continue to monitor blood pressure at home and let me know if persistently elevated.    Vitamin D level checked and is within normal limits.  Recheck TSH in January

## 2018-02-16 ENCOUNTER — Encounter (INDEPENDENT_AMBULATORY_CARE_PROVIDER_SITE_OTHER): Payer: Self-pay | Admitting: Orthopaedic Surgery

## 2018-02-16 ENCOUNTER — Ambulatory Visit (INDEPENDENT_AMBULATORY_CARE_PROVIDER_SITE_OTHER): Payer: Medicare Other

## 2018-02-16 ENCOUNTER — Ambulatory Visit (INDEPENDENT_AMBULATORY_CARE_PROVIDER_SITE_OTHER): Payer: Medicare Other | Admitting: Orthopaedic Surgery

## 2018-02-16 DIAGNOSIS — M25512 Pain in left shoulder: Secondary | ICD-10-CM | POA: Diagnosis not present

## 2018-02-16 MED ORDER — LIDOCAINE HCL 1 % IJ SOLN
3.0000 mL | INTRAMUSCULAR | Status: AC | PRN
  Administered 2018-02-16: 3 mL

## 2018-02-16 MED ORDER — METHYLPREDNISOLONE ACETATE 40 MG/ML IJ SUSP
40.0000 mg | INTRAMUSCULAR | Status: AC | PRN
  Administered 2018-02-16: 40 mg via INTRA_ARTICULAR

## 2018-02-16 NOTE — Progress Notes (Signed)
Office Visit Note   Patient: [name]           Date of Birth: 11-28-1945           MRN: [id number] Visit Date: 02/16/2018              Requested by: Elby Showers, MD [address] [place], [place] [id number] PCP: Elby Showers, MD   Assessment & Plan: Visit Diagnoses:  1. Left shoulder pain, unspecified chronicity     Plan: She is given handouts on shoulder exercises as a reminder she went through therapy for the right shoulder.  We will see her back on an as-needed basis pain persist or becomes worse.  Questions encouraged and answered at length by Dr. Ninfa Linden and myself.   Follow-Up Instructions: Return if symptoms worsen or fail to improve.   Orders:  Orders Placed This Encounter  Procedures  . Large Joint Inj  . XR Shoulder Left   No orders of the defined types were placed in this encounter.     Procedures: Large Joint Inj on 02/16/2018 10:42 AM Indications: pain Details: 22 G 1.5 in needle, superior approach  Arthrogram: No  Medications: 3 mL lidocaine 1 %; 40 mg methylPREDNISolone acetate 40 MG/ML Outcome: tolerated well, no immediate complications Procedure, treatment alternatives, risks and benefits explained, specific risks discussed. Consent was given by the patient. Immediately prior to procedure a time out was called to verify the correct patient, procedure, equipment, support staff and site/side marked as required. Patient was prepped and draped in the usual sterile fashion.       Clinical Data: No additional findings.   Subjective: Chief Complaint  Patient presents with  . Left Shoulder - Pain    HPI [name] comes in today with new complaint of left shoulder pain for the past month no known injury.  Pain is mostly with abduction of the shoulder.  She notes that the pain does awaken about every 2 hours.  She denies any numbness or radicular pain down the left arm.  She is left-hand dominant.  She denies any fevers chills chest  pain or shortness of breath.  Review of Systems See HPI  Objective: Vital Signs: There were no vitals taken for this visit.  Physical Exam  Constitutional: She is oriented to person, place, and time. She appears well-developed and well-nourished. No distress.  Pulmonary/Chest: Effort normal.  Neurological: She is alert and oriented to person, place, and time.  Skin: She is not diaphoretic.    Ortho Exam Bilateral shoulders 5 and 5 strength in external and internal rotation against resistance.  Negative empty can test bilaterally.  Liftoff test negative bilaterally.  Positive impingement testing on the left negative on the right.  Full range of motion of the left shoulder with some discomfort particularly with abduction. Specialty Comments:  No specialty comments available.  Imaging: Xr Shoulder Left  Result Date: 02/16/2018 3 views left shoulder: No acute fracture.  Glenohumeral joint is well-maintained.  Cystic changes seen in the humeral head.    PMFS History: Patient Active Problem List   Diagnosis Date Noted  . History of hip replacement, total, unspecified laterality 06/22/2017  . Impingement syndrome of right shoulder 08/04/2016  . Degenerative arthritis of hip 07/02/2012  . Osteoarthritis of right hip 05/24/2012  . GE reflux 09/23/2011  . Hypothyroidism 01/07/2011  . Anxiety 01/07/2011  . Allergic rhinitis 01/07/2011  . 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 Allergic bronchitis   . Allergic bronchitis   . Allergy   . Arthritis   . Claustrophobia   . GERD (gastroesophageal reflux disease)    occas problem - burning, cough would take prilosec if needed  . H/O hiatal hernia   . HSV-2 (herpes simplex virus 2) infection   . Hypothyroidism   . Peripheral neuropathy    3 toes right foot 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 problem  . Thyroid disease     Family History  Problem Relation Age of Onset  . Heart disease  Father   . Hypertension Sister   . Heart disease Brother   . Asthma Daughter   . Hypertension Sister     Past Surgical History:  Procedure Laterality Date  . BACK SURGERY    . BREAST BIOPSY  1972   benign  . CHOLECYSTECTOMY    . LUMBAR LAMINECTOMY  1999   L4  . LUMBAR LAMINECTOMY  1981   L5  . TOTAL HIP ARTHROPLASTY Right 07/02/2012   Procedure: RIGHT TOTAL HIP ARTHROPLASTY ANTERIOR APPROACH;  Surgeon: Mcarthur Rossetti, MD;  Location: [facility];  Service: Orthopedics;  Laterality: Right;   Social History   Occupational History  . Not on file  Tobacco Use  . Smoking status: Never Smoker  . Smokeless tobacco: Never Used  Substance and Sexual Activity  . Alcohol use: Yes    Comment: socially  . Drug use: No  . Sexual activity: Not on file

## 2018-02-23 DIAGNOSIS — B360 Pityriasis versicolor: Secondary | ICD-10-CM | POA: Diagnosis not present

## 2018-03-04 ENCOUNTER — Other Ambulatory Visit: Payer: Medicare Other | Admitting: Internal Medicine

## 2018-03-11 ENCOUNTER — Telehealth: Payer: Self-pay | Admitting: Internal Medicine

## 2018-03-11 MED ORDER — FUROSEMIDE 40 MG PO TABS: 40.0000 mg | ORAL_TABLET | Freq: Every day | ORAL | 0 refills | Status: DC

## 2018-03-11 NOTE — Telephone Encounter (Signed)
States that when she was here last, you all talked about her feet and legs swelling when she traveled last.  They are flying out to the [facility].  They are flying out on Saturday.  She wants to know if you will send a diuretic to her pharmacy?  She just wants to make sure she doesn't have the same problem that she had before because it was so uncomfortable trying to get her shoes on once she returned and getting that fluid off of her.    Pharmacy:  [facility]  (Could you just put a note on there to please notify the patient when it it ready for pick up?)  Phone:  [phone]  Thank you.

## 2018-03-11 NOTE — Telephone Encounter (Signed)
Please call in Lasix 40 mg daily #30 and for pharmacy to call pt when available for pick up. One po daily prn edema

## 2018-03-11 NOTE — Telephone Encounter (Signed)
Done

## 2018-03-25 ENCOUNTER — Other Ambulatory Visit: Payer: Medicare Other | Admitting: Internal Medicine

## 2018-03-25 DIAGNOSIS — E039 Hypothyroidism, unspecified: Secondary | ICD-10-CM

## 2018-03-25 LAB — TSH: TSH: 7.87 m[IU]/L — AB (ref 0.40–4.50)

## 2018-04-09 ENCOUNTER — Encounter: Payer: Self-pay | Admitting: Internal Medicine

## 2018-04-09 ENCOUNTER — Ambulatory Visit (INDEPENDENT_AMBULATORY_CARE_PROVIDER_SITE_OTHER): Payer: Medicare Other | Admitting: Internal Medicine

## 2018-04-09 VITALS — BP 160/100 | HR 77 | Temp 98.7°F | Ht 63.5 in | Wt 144.0 lb

## 2018-04-09 DIAGNOSIS — E039 Hypothyroidism, unspecified: Secondary | ICD-10-CM

## 2018-04-09 DIAGNOSIS — R7989 Other specified abnormal findings of blood chemistry: Secondary | ICD-10-CM

## 2018-04-09 NOTE — Progress Notes (Signed)
   Subjective:    Patient ID: [name], female    DOB: 1945/12/11, 73 y.o.   MRN: [id number]  HPI  I asked her to come by and discuss with me elevated TSH.  Apparently they gave her a generic for thyroid replacement and she usually gets non-generic Synthroid.  This tablet dissolved on her tongue and she felt like it might not be as effective.  Generally has had to take non-generic Synthroid due to absorption issues in the past she says.  In October 2019, her TSH was 0.27 and had been 0.36 in February 2019.  However in February it was close to normal which was 0.40 and I did not change dose.  However I noticed it was low once again and October and decided to repeat it in [facility] she just got it checked January 9.  TSH is now 7.87.  Review of Systems has not felt quite as perky lately     Objective:   Physical Exam  She has no thyromegaly and no cervical adenopathy Vital signs reviewed    Assessment & Plan:  Hypothyroidism with elevated TSH  Plan: Prescribe non-generic Synthroid 100 mcg daily and follow-up with TSH in 6 weeks

## 2018-04-09 NOTE — Patient Instructions (Signed)
Change to non-generic Synthroid 100 mcg daily and follow-up in 6 weeks with TSH without office visit

## 2018-04-13 ENCOUNTER — Other Ambulatory Visit: Payer: Self-pay | Admitting: Internal Medicine

## 2018-05-13 ENCOUNTER — Other Ambulatory Visit: Payer: Self-pay | Admitting: Internal Medicine

## 2018-05-17 ENCOUNTER — Other Ambulatory Visit: Payer: Medicare Other | Admitting: Internal Medicine

## 2018-05-17 DIAGNOSIS — E039 Hypothyroidism, unspecified: Secondary | ICD-10-CM | POA: Diagnosis not present

## 2018-05-17 LAB — TSH: TSH: 1.8 mIU/L (ref 0.40–4.50)

## 2018-06-02 ENCOUNTER — Telehealth: Payer: Self-pay | Admitting: Internal Medicine

## 2018-06-02 DIAGNOSIS — M25512 Pain in left shoulder: Secondary | ICD-10-CM

## 2018-06-02 MED ORDER — CYCLOBENZAPRINE HCL 10 MG PO TABS: 10.0000 mg | ORAL_TABLET | Freq: Three times a day (TID) | ORAL | 2 refills | Status: DC | PRN

## 2018-06-02 NOTE — Telephone Encounter (Signed)
Done

## 2018-06-02 NOTE — Telephone Encounter (Signed)
[name] [phone]  [facility]  [name] called in to say she has pain in her neck, she has gone for massages two different times and it has not helped. She would like a muscle relaxer to see if that would help.

## 2018-06-02 NOTE — Telephone Encounter (Signed)
Call in Flexeril 10 mg #30 one po tid prn muscle spasm with 2 refills

## 2018-06-21 ENCOUNTER — Telehealth: Payer: Self-pay

## 2018-06-21 ENCOUNTER — Other Ambulatory Visit: Payer: Self-pay

## 2018-06-21 MED ORDER — LEVOTHYROXINE SODIUM 100 MCG PO TABS: 100.0000 ug | ORAL_TABLET | Freq: Every day | ORAL | 3 refills | Status: DC

## 2018-06-21 MED ORDER — SYNTHROID 100 MCG PO TABS: 100.0000 ug | ORAL_TABLET | Freq: Every day | ORAL | 3 refills | Status: DC

## 2018-06-21 NOTE — Telephone Encounter (Signed)
REFILL 

## 2018-07-06 ENCOUNTER — Other Ambulatory Visit: Payer: Self-pay

## 2018-07-06 ENCOUNTER — Ambulatory Visit (INDEPENDENT_AMBULATORY_CARE_PROVIDER_SITE_OTHER): Payer: Medicare Other | Admitting: Internal Medicine

## 2018-07-06 ENCOUNTER — Encounter (INDEPENDENT_AMBULATORY_CARE_PROVIDER_SITE_OTHER): Payer: Self-pay | Admitting: Orthopaedic Surgery

## 2018-07-06 ENCOUNTER — Ambulatory Visit (INDEPENDENT_AMBULATORY_CARE_PROVIDER_SITE_OTHER): Payer: Medicare Other | Admitting: Orthopaedic Surgery

## 2018-07-06 ENCOUNTER — Encounter: Payer: Self-pay | Admitting: Internal Medicine

## 2018-07-06 DIAGNOSIS — R609 Edema, unspecified: Secondary | ICD-10-CM | POA: Diagnosis not present

## 2018-07-06 DIAGNOSIS — E039 Hypothyroidism, unspecified: Secondary | ICD-10-CM

## 2018-07-06 DIAGNOSIS — M25512 Pain in left shoulder: Secondary | ICD-10-CM | POA: Diagnosis not present

## 2018-07-06 DIAGNOSIS — G2581 Restless legs syndrome: Secondary | ICD-10-CM

## 2018-07-06 MED ORDER — TORSEMIDE 20 MG PO TABS: 20.0000 mg | ORAL_TABLET | Freq: Every day | ORAL | 0 refills | Status: DC

## 2018-07-06 MED ORDER — LIDOCAINE HCL 1 % IJ SOLN
3.0000 mL | INTRAMUSCULAR | Status: AC | PRN
  Administered 2018-07-06: 3 mL

## 2018-07-06 MED ORDER — METHYLPREDNISOLONE ACETATE 40 MG/ML IJ SUSP
40.0000 mg | INTRAMUSCULAR | Status: AC | PRN
  Administered 2018-07-06: 09:00:00 40 mg via INTRA_ARTICULAR

## 2018-07-06 MED ORDER — ROPINIROLE HCL 0.5 MG PO TABS: 0.5000 mg | ORAL_TABLET | Freq: Every day | ORAL | 0 refills | Status: DC

## 2018-07-06 NOTE — Progress Notes (Signed)
   Subjective:    Patient ID: [name], female    DOB: 02-05-1946, 73 y.o.   MRN: [id number]  HPI 72 year old Female seen by interactive audio and video telecommunications due to the coronavirus pandemic.  She gives consent for visit in this format today.  Audio and video telecommunications were obtained between this provider and patient without difficulty.  She is identified as Sherry Harrison. Mason 5 2 identifiers as a patient in this practice.  Patient complains edema in feet and legs since around mid March.  She has Lasix to take for the dependent edema.  Has been exhibiting some muscle pain in her legs as well which may be related to the edema.    Review of Systems says that she is having some restless leg issues as well.  Cannot keep leg still in the evenings in bed.  Feels that Lasix has not helped dependent edema.  Is not going to the gym is much since it is close during the pandemic.  However has been trying to exercise some at home and walks some.  Not eating a lot of salt.     Objective:   Physical Exam Can see that she has some dependent edema on virtual visit       Assessment & Plan:  Dependent edema  Restless leg syndrome  Hypothyroidism-TSH was checked on March 2 and was found to be within normal limits.  Plan: Change Lasix to Demadex 20 mg daily.  Try Requip 0.5 mg at bedtime for restless leg syndrome.  If she stays on torsemide she will need to have potassium checked in the near future.  Call with progress report.  20 minutes spent with patient, reviewing records, taking history, making medical decisions and creating treatment plan.

## 2018-07-06 NOTE — Progress Notes (Signed)
   Procedure Note  Patient: [name]             Date of Birth: 1945-08-26           MRN: [id number]             Visit Date: 07/06/2018 HPI: [name] returns today due to left shoulder pain.  She states that the injection she got in December helped with her shoulder pain until 1 February pain slowly came back and then over the last 4 weeks is really bothering her.  States pain is worse at night.  Also with reaching overhead head and internally rotating the left arm.  She is having no radicular symptoms down the arm.  She is tried the home exercises that she was shown at last visit states that these feel like they may even aggravate that shoulder.  She is taking ibuprofen with no relief.  She is requesting a second injection in the shoulder.  Review of systems: No fevers or chills.  Please see HPI otherwise negative or noncontributory.  Physical exam General well-developed well-nourished female no acute distress mood affect appropriate  Left shoulder full forward flexion full abduction.  Slightly limited internal rotation.  Positive impingement sign on the left.   Procedures: Visit Diagnoses: Left shoulder pain, unspecified chronicity  Large Joint Inj: L subacromial bursa on 07/06/2018 8:53 AM Indications: pain Details: 22 G 1.5 in needle, superior approach  Arthrogram: No  Medications: 3 mL lidocaine 1 %; 40 mg methylPREDNISolone acetate 40 MG/ML Outcome: tolerated well, no immediate complications Procedure, treatment alternatives, risks and benefits explained, specific risks discussed. Consent was given by the patient. Immediately prior to procedure a time out was called to verify the correct patient, procedure, equipment, support staff and site/side marked as required. Patient was prepped and draped in the usual sterile fashion.     Plan: We will send her to formal physical therapy for range of motion strengthening and modalities.  See her back in 6 weeks check her response to  these treatments.  Pain persist may consider MRI to rule out rotator cuff tear.  Questions encouraged and answered at length.

## 2018-07-13 DIAGNOSIS — M25512 Pain in left shoulder: Secondary | ICD-10-CM | POA: Diagnosis not present

## 2018-07-15 DIAGNOSIS — M25512 Pain in left shoulder: Secondary | ICD-10-CM | POA: Diagnosis not present

## 2018-07-15 NOTE — Patient Instructions (Signed)
Requip 0.5 mg at bedtime.  Change Lasix to Demadex 20 mg daily.  Will need potassium check if remains on Demadex in the next 2 to 4 weeks.  Call with progress report.  Continue same dose of thyroid replacement.

## 2018-07-20 ENCOUNTER — Encounter: Payer: Self-pay | Admitting: Internal Medicine

## 2018-07-20 ENCOUNTER — Other Ambulatory Visit: Payer: Self-pay

## 2018-07-20 ENCOUNTER — Ambulatory Visit (INDEPENDENT_AMBULATORY_CARE_PROVIDER_SITE_OTHER): Payer: Medicare Other | Admitting: Internal Medicine

## 2018-07-20 VITALS — BP 130/92 | HR 85 | Wt 147.0 lb

## 2018-07-20 DIAGNOSIS — E039 Hypothyroidism, unspecified: Secondary | ICD-10-CM | POA: Diagnosis not present

## 2018-07-20 DIAGNOSIS — Z79899 Other long term (current) drug therapy: Secondary | ICD-10-CM

## 2018-07-20 DIAGNOSIS — K210 Gastro-esophageal reflux disease with esophagitis: Secondary | ICD-10-CM | POA: Diagnosis not present

## 2018-07-20 DIAGNOSIS — R609 Edema, unspecified: Secondary | ICD-10-CM

## 2018-07-20 DIAGNOSIS — M19012 Primary osteoarthritis, left shoulder: Secondary | ICD-10-CM

## 2018-07-20 DIAGNOSIS — G2581 Restless legs syndrome: Secondary | ICD-10-CM

## 2018-07-20 DIAGNOSIS — M791 Myalgia, unspecified site: Secondary | ICD-10-CM | POA: Diagnosis not present

## 2018-07-20 DIAGNOSIS — Z5181 Encounter for therapeutic drug level monitoring: Secondary | ICD-10-CM | POA: Diagnosis not present

## 2018-07-20 DIAGNOSIS — M25512 Pain in left shoulder: Secondary | ICD-10-CM | POA: Diagnosis not present

## 2018-07-20 NOTE — Progress Notes (Signed)
   Subjective:    Patient ID: [name], female    DOB: [birth year], 73 y.o.   MRN: [id number]  HPI 73 year old Female seen in follow-up of virtual visit on April 21 regarding lower extremity edema.  Says that edema started around mid March.  In April we changed her from Lasix to Demadex 20 mg daily and started her on Requip for restless leg complaints.  She is here today to have potassium drawn and follow-up will be dependent edema.  Also due to restless leg complaints I will to check her for iron deficiency. Are being met is stable with potassium 3.5.  However I would like to start her on K-Dur 20 mEq daily since she will continue with torsemide.  Sed rate was normal.  This was done because she was complaining of edema and was concerned about possible rheumatoid arthritis or other inflammatory arthropathy.  No iron deficiency.  Continues to have issues with her shoulder and will be following up with orthopedist.   Review of Systems reports improvement with restless leg syndrome on Requip     Objective:   Physical Exam She has trace lower extremity edema today but it is nonpitting.       Assessment & Plan:  Dependent edema-improved with torsemide.  Check potassium.  Add potassium supplementation 20 mEq daily.  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  Complaint of myalgias-check sed rate

## 2018-07-20 NOTE — Patient Instructions (Addendum)
Continue torsemide for dependent edema may decrease Requip to 0.25 mg hs. Labs pending.  Follow-up with orthopedist regarding left shoulder pain

## 2018-07-21 LAB — IRON,TIBC AND FERRITIN PANEL
%SAT: 22 % (calc) (ref 16–45)
Ferritin: 125 ng/mL (ref 16–288)
Iron: 84 ug/dL (ref 45–160)
TIBC: 390 mcg/dL (calc) (ref 250–450)

## 2018-07-21 LAB — TSH: TSH: 2.3 m[IU]/L (ref 0.40–4.50)

## 2018-07-21 LAB — SEDIMENTATION RATE: Sed Rate: 11 mm/h (ref 0–30)

## 2018-07-21 LAB — BASIC METABOLIC PANEL WITH GFR
BUN: 20 mg/dL (ref 7–25)
CO2: 32 mmol/L (ref 20–32)
Calcium: 9.9 mg/dL (ref 8.6–10.4)
Chloride: 94 mmol/L — ABNORMAL LOW (ref 98–110)
Creat: 0.72 mg/dL (ref 0.60–0.93)
Glucose, Bld: 79 mg/dL (ref 65–99)
Potassium: 3.5 mmol/L (ref 3.5–5.3)
Sodium: 140 mmol/L (ref 135–146)

## 2018-07-22 ENCOUNTER — Other Ambulatory Visit: Payer: Self-pay

## 2018-07-22 DIAGNOSIS — M25512 Pain in left shoulder: Secondary | ICD-10-CM | POA: Diagnosis not present

## 2018-07-22 MED ORDER — TORSEMIDE 20 MG PO TABS: 20.0000 mg | ORAL_TABLET | Freq: Every day | ORAL | 0 refills | Status: DC

## 2018-07-22 MED ORDER — POTASSIUM CHLORIDE CRYS ER 20 MEQ PO TBCR: 20.0000 meq | EXTENDED_RELEASE_TABLET | Freq: Every day | ORAL | 0 refills | Status: DC

## 2018-07-23 ENCOUNTER — Encounter: Payer: Self-pay | Admitting: Orthopaedic Surgery

## 2018-08-03 ENCOUNTER — Ambulatory Visit: Payer: Self-pay | Admitting: Orthopaedic Surgery

## 2018-08-10 ENCOUNTER — Other Ambulatory Visit: Payer: Self-pay | Admitting: Internal Medicine

## 2018-08-23 ENCOUNTER — Other Ambulatory Visit: Payer: Self-pay

## 2018-08-23 ENCOUNTER — Ambulatory Visit (INDEPENDENT_AMBULATORY_CARE_PROVIDER_SITE_OTHER): Payer: Medicare Other | Admitting: Internal Medicine

## 2018-08-23 ENCOUNTER — Encounter: Payer: Self-pay | Admitting: Internal Medicine

## 2018-08-23 VITALS — BP 150/80 | HR 84 | Ht 63.5 in | Wt 153.0 lb

## 2018-08-23 DIAGNOSIS — Z5181 Encounter for therapeutic drug level monitoring: Secondary | ICD-10-CM | POA: Diagnosis not present

## 2018-08-23 DIAGNOSIS — M7989 Other specified soft tissue disorders: Secondary | ICD-10-CM | POA: Diagnosis not present

## 2018-08-23 DIAGNOSIS — E876 Hypokalemia: Secondary | ICD-10-CM

## 2018-08-23 DIAGNOSIS — Z79899 Other long term (current) drug therapy: Secondary | ICD-10-CM | POA: Diagnosis not present

## 2018-08-23 DIAGNOSIS — E039 Hypothyroidism, unspecified: Secondary | ICD-10-CM

## 2018-08-23 DIAGNOSIS — R609 Edema, unspecified: Secondary | ICD-10-CM

## 2018-08-23 DIAGNOSIS — G2581 Restless legs syndrome: Secondary | ICD-10-CM | POA: Diagnosis not present

## 2018-08-23 DIAGNOSIS — I878 Other specified disorders of veins: Secondary | ICD-10-CM

## 2018-08-23 NOTE — Patient Instructions (Signed)
To have Dopplers of the lower extremities.  Basic metabolic panel, BNP, TSH checked.  Continue torsemide 40 mg daily.

## 2018-08-23 NOTE — Progress Notes (Addendum)
   Subjective:    Patient ID: [name], female    DOB: [birth year], 73 y.o.   MRN: [id number]  HPI 73 year old Female messaged me over the weekend that her lower extremity edema had worsened.  This is  despite being on torsemide 40 mg daily.  Only new medication is Requip which was was prescribed for restless leg syndrome in April.  Also in April, at that same time, Lasix was changed to Demadex 20 mg daily.  Her TSH was checked in May and was normal.  Her potassium was low normal at 3.5 and she was started on potassium supplement.  Basic metabolic panel will be rechecked today along with BNP, TSH.  Patient is seeing physical therapist for left shoulder issues.  She had injection of methylprednisolone in the shoulder April 21.  She is not on any anti-inflammatory medication that would cause edema.    Review of Systems history of hypothyroid  and is on thyroid replacement therapy.  In early May, TSH was normal.     Objective:   Physical Exam She has a collection of superficial varicosities in a circular distribution just below her left knee anteriorly.  These are not swollen.  There is no increased redness in that area.  However both ankles are red and there are stasis changes noted in her ankles bilaterally.  Left foot is puffy.  Neck is supple.  No thyromegaly.  Chest clear.  Cardiac exam regular rate and rhythm normal S1 and S2.       Assessment & Plan:  Dependent edema-seems to have fluid retention.  Apparently Requip can cause edema.that is her only new medication.    Denies eating excess salt.  She is not on anti-inflammatory medications which can cause fluid retention.  Hypothyroidism-check TSH once again  Restless leg syndrome-Requip may be causing the same and she is to discontinue Requip.  Plan: TSH, basic metabolic panel, BNP ordered.  She will have lower extremity Dopplers.  Continue torsemide 40 mg daily.  Keep feet elevated as much as possible.  Watch salt intake.  Walk  some daily.  If not improving consider referral to Cardiology.  K stable at 3.8 on diuretic. TSH elevated but pt denies missing doses. Use nongeneric Synthroid and increase to 0.112 mg daily with f/u in 4-6 weeks. BNP is normal. Hoping stopping Requip  And increasing thyroid replacement med will help edema   25 minutes spent  with pt as  well as reviewing labs and medical decision making in numerous issues above.

## 2018-08-24 ENCOUNTER — Ambulatory Visit (HOSPITAL_COMMUNITY)
Admission: RE | Admit: 2018-08-24 | Discharge: 2018-08-24 | Disposition: A | Discharge status: Home / Self Care | Payer: Medicare Other | Source: Ambulatory Visit | Attending: Internal Medicine | Admitting: Internal Medicine

## 2018-08-24 ENCOUNTER — Other Ambulatory Visit: Payer: Self-pay

## 2018-08-24 ENCOUNTER — Telehealth: Payer: Self-pay

## 2018-08-24 DIAGNOSIS — I878 Other specified disorders of veins: Secondary | ICD-10-CM | POA: Insufficient documentation

## 2018-08-24 LAB — TSH: TSH: 5.56 mIU/L — ABNORMAL HIGH (ref 0.40–4.50)

## 2018-08-24 LAB — BASIC METABOLIC PANEL
BUN: 19 mg/dL (ref 7–25)
CO2: 33 mmol/L — ABNORMAL HIGH (ref 20–32)
Calcium: 9.3 mg/dL (ref 8.6–10.4)
Chloride: 95 mmol/L — ABNORMAL LOW (ref 98–110)
Creat: 0.81 mg/dL (ref 0.60–0.93)
Glucose, Bld: 80 mg/dL (ref 65–99)
Potassium: 3.8 mmol/L (ref 3.5–5.3)
Sodium: 138 mmol/L (ref 135–146)

## 2018-08-24 LAB — BRAIN NATRIURETIC PEPTIDE: Brain Natriuretic Peptide: 30 pg/mL (ref <100)

## 2018-08-24 MED ORDER — LEVOTHYROXINE SODIUM 112 MCG PO TABS: ORAL_TABLET | ORAL | 0 refills | Status: DC

## 2018-08-24 NOTE — Addendum Note (Signed)
Addended by: Elby Showers on: 08/24/2018 11:34 AM   Modules accepted: Level of Service

## 2018-08-24 NOTE — Addendum Note (Signed)
Addended by: Elby Showers on: 08/24/2018 11:36 AM   Modules accepted: Orders

## 2018-08-24 NOTE — Telephone Encounter (Signed)
Greg from vascular called patients bilateral doppler was negative for DVT.

## 2018-08-24 NOTE — Telephone Encounter (Signed)
See note I left you on prelim report. Please call her

## 2018-08-24 NOTE — Progress Notes (Signed)
Bilateral lower extremity venous duplex has been completed. Preliminary results can be found in CV Proc through chart review.  Results were given to Horace at Dr. Verlene Mayer office.   08/24/18 1:12 PM [name] RVT

## 2018-08-26 ENCOUNTER — Encounter: Payer: Self-pay | Admitting: Orthopaedic Surgery

## 2018-08-26 ENCOUNTER — Ambulatory Visit (INDEPENDENT_AMBULATORY_CARE_PROVIDER_SITE_OTHER): Payer: Medicare Other | Admitting: Orthopaedic Surgery

## 2018-08-26 ENCOUNTER — Other Ambulatory Visit: Payer: Self-pay

## 2018-08-26 DIAGNOSIS — M79632 Pain in left forearm: Secondary | ICD-10-CM | POA: Insufficient documentation

## 2018-08-26 DIAGNOSIS — M25512 Pain in left shoulder: Secondary | ICD-10-CM

## 2018-08-26 MED ORDER — METHYLPREDNISOLONE 4 MG PO TABS: ORAL_TABLET | ORAL | 0 refills | Status: DC

## 2018-08-26 NOTE — Progress Notes (Signed)
Patient comes in today for continued follow-up of her left shoulder.  We injected the shoulder with a steroid back in April.  She still having some problems with shoulder pain but a lot of problems with forearm pain.  This is on the left side.  She is left-hand dominant.  She did injure herself 2 days ago when she woke up from a bad dream and jumped up and ran into the wall really hard.  She has bruising along her ribs and all along her elbow and arm on the left side.  The left forearm pain is been hurting for about 5 weeks now.  She denies any numbness and tingling in her hands.  She does report improve mobility of her left shoulder.  She has had recent bilateral foot and ankle swelling.  Her primary care physician feels this may be related to medication that she is on and that medication has been stopped.  This was Requip.  On examination her left shoulder is painful throughout the arc of motion but much better motion and much less pain than it was before.  There does not appear to be deficits of the rotator cuff.  She does hurt in the dorsal forearm muscles on the left side.  Her left elbow and left wrist and hand exam are normal.  She hurts to palpation in these areas.  I would like to try a 6-day steroid taper for inflammation.  Also recommended Voltaren gel as an over-the-counter medication to try on her forearm and shoulder.  We can see her back in about 4 weeks to see if it is worth repeating injection in her shoulder.  All question concerns were answered and addressed.

## 2018-09-04 ENCOUNTER — Encounter: Payer: Self-pay | Admitting: Internal Medicine

## 2018-09-04 DIAGNOSIS — R609 Edema, unspecified: Secondary | ICD-10-CM | POA: Insufficient documentation

## 2018-09-23 ENCOUNTER — Other Ambulatory Visit: Payer: Self-pay | Admitting: Internal Medicine

## 2018-09-23 ENCOUNTER — Encounter: Payer: Self-pay | Admitting: Orthopaedic Surgery

## 2018-09-23 ENCOUNTER — Ambulatory Visit (INDEPENDENT_AMBULATORY_CARE_PROVIDER_SITE_OTHER): Payer: Medicare Other | Admitting: Orthopaedic Surgery

## 2018-09-23 ENCOUNTER — Other Ambulatory Visit: Payer: Self-pay

## 2018-09-23 DIAGNOSIS — M25511 Pain in right shoulder: Secondary | ICD-10-CM

## 2018-09-23 DIAGNOSIS — G8929 Other chronic pain: Secondary | ICD-10-CM

## 2018-09-23 DIAGNOSIS — M25512 Pain in left shoulder: Secondary | ICD-10-CM | POA: Diagnosis not present

## 2018-09-23 MED ORDER — LIDOCAINE HCL 1 % IJ SOLN
3.0000 mL | INTRAMUSCULAR | Status: AC | PRN
  Administered 2018-09-23: 3 mL

## 2018-09-23 MED ORDER — METHYLPREDNISOLONE ACETATE 40 MG/ML IJ SUSP
40.0000 mg | INTRAMUSCULAR | Status: AC | PRN
  Administered 2018-09-23: 40 mg via INTRA_ARTICULAR

## 2018-09-23 NOTE — Progress Notes (Signed)
Office Visit Note   Patient: [name]           Date of Birth: 1945-12-03           MRN: [id number] Visit Date: 09/23/2018              Requested by: Elby Showers, MD [address] [place],  [place] [address] PCP: Elby Showers, MD   Assessment & Plan: Visit Diagnoses:  1. Chronic left shoulder pain   2. Chronic right shoulder pain     Plan: Per her wishes I did provide steroid injections in both shoulder subacromial spaces which she tolerated well.  I was fine with this since that is been 3 months since her last injections.  At this point if her pain and weakness do not improve MRIs of both shoulders be warranted to assess the rotator cuff or any other pathology that would warrant any type of arthroscopic intervention.  All question concerns were otherwise answered and addressed.  Follow-up can be as needed unless things worsen.  Follow-Up Instructions: Return if symptoms worsen or fail to improve.   Orders:  Orders Placed This Encounter  Procedures  . Large Joint Inj  . Large Joint Inj   No orders of the defined types were placed in this encounter.     Procedures: Large Joint Inj: R subacromial bursa on 09/23/2018 1:50 PM Indications: pain and diagnostic evaluation Details: 22 G 1.5 in needle  Arthrogram: No  Medications: 3 mL lidocaine 1 %; 40 mg methylPREDNISolone acetate 40 MG/ML Outcome: tolerated well, no immediate complications Procedure, treatment alternatives, risks and benefits explained, specific risks discussed. Consent was given by the patient. Immediately prior to procedure a time out was called to verify the correct patient, procedure, equipment, support staff and site/side marked as required. Patient was prepped and draped in the usual sterile fashion.   Large Joint Inj: L subacromial bursa on 09/23/2018 1:50 PM Indications: pain and diagnostic evaluation Details: 22 G 1.5 in needle  Arthrogram: No  Medications: 3 mL lidocaine 1 %; 40 mg  methylPREDNISolone acetate 40 MG/ML Outcome: tolerated well, no immediate complications Procedure, treatment alternatives, risks and benefits explained, specific risks discussed. Consent was given by the patient. Immediately prior to procedure a time out was called to verify the correct patient, procedure, equipment, support staff and site/side marked as required. Patient was prepped and draped in the usual sterile fashion.       Clinical Data: No additional findings.   Subjective: Chief Complaint  Patient presents with  . Left Shoulder - Follow-up  [name] continues to have significant pain in both of her shoulders.  Hurts mainly with overhead activities and reaching behind her.  We have tried steroid injections before.  At the last visit it was too early to try injections again so we put her on a 6-day steroid taper and she said that was wonderful but now her pain and weakness have come back significantly.  She denies any neck pain denies any numbness and tingling in her hands.  HPI  Review of Systems She denies any headache, chest pain, shortness of breath, fever, chills, nausea, vomiting  Objective: Vital Signs: There were no vitals taken for this visit.  Physical Exam She is alert and orient x3 and in no acute distress Ortho Exam Examination of both shoulder shows significant pain with impingement but good range of motion.  There is some slight weakness in the rotator cuff bilaterally. Specialty Comments:  No  specialty comments available.  Imaging: No results found.   PMFS History: Patient Active Problem List   Diagnosis Date Noted  . Dependent edema 09/04/2018  . Left forearm pain 08/26/2018  . Asymptomatic postmenopausal estrogen deficiency 01/03/2018  . History of hip replacement, total, unspecified laterality 06/22/2017  . Impingement syndrome of right shoulder 08/04/2016  . Bilateral sensorineural hearing loss 01/09/2016  . Bilateral tinnitus 01/09/2016  .  Age-related nuclear cataract of both eyes 09/21/2015  . Choroidal nevus, left eye 09/21/2015  . Chronic dryness of both eyes 09/21/2015  . Dermatochalasis of eyelid 09/21/2015  . Presbyopia of both eyes 09/21/2015  . PVD (posterior vitreous detachment), both eyes 09/21/2015  . Regular astigmatism of both eyes 09/21/2015  . Lateral cystocele 07/23/2015  . Rectocele 07/23/2015  . Osteoarthritis of right hip 05/24/2012  . GE reflux 09/23/2011  . Hypothyroidism 01/07/2011  . Anxiety 01/07/2011  . Allergic rhinitis 01/07/2011  . 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 Allergic bronchitis   . Allergic bronchitis   . Allergy   . Arthritis   . Claustrophobia   . GERD (gastroesophageal reflux disease)    occas problem - burning, cough would take prilosec if needed  . H/O hiatal hernia   . HSV-2 (herpes simplex virus 2) infection   . Hypothyroidism   . Peripheral neuropathy    3 toes right foot 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 problem  . Thyroid disease     Family History  Problem Relation Age of Onset  . Heart disease Father   . Hypertension Sister   . Heart disease Brother   . Asthma Daughter   . Hypertension Sister     Past Surgical History:  Procedure Laterality Date  . BACK SURGERY    . BREAST BIOPSY  1972   benign  . CHOLECYSTECTOMY    . LUMBAR LAMINECTOMY  1999   L4  . LUMBAR LAMINECTOMY  1981   L5  . TOTAL HIP ARTHROPLASTY Right 07/02/2012   Procedure: RIGHT TOTAL HIP ARTHROPLASTY ANTERIOR APPROACH;  Surgeon: Mcarthur Rossetti, MD;  Location: [facility];  Service: Orthopedics;  Laterality: Right;   Social History   Occupational History  . Not on file  Tobacco Use  . Smoking status: Never Smoker  . Smokeless tobacco: Never Used  Substance and Sexual Activity  . Alcohol use: Yes    Comment: socially  . Drug use: No  . Sexual activity: Not on file

## 2018-10-14 NOTE — Progress Notes (Signed)
Cardiology Office Note:    Date:  10/15/2018   ID:  [name], DOB 03/30/1945, MRN [id number]  PCP:  Elby Showers, MD  Cardiologist:  Buford Dresser, MD PhD  Referring MD: Elby Showers, MD   CC: New consult for LE edema  History of Present Illness:    [name] is a 73 y.o. female with a hx of hypothyroidism, restless leg, arthritis who is seen as a new consult at the request of Baxley, Cresenciano Lick, MD for the evaluation and management of edema.  HPI: She reports that lower extremity edema came on very suddenly in the middle of March 2020. Has been working on good health habits, using a trainer, etc. Felt she was in good health, then with Covid had to stop using trainer and started to notice swelling. Around the same time, had terrible URI symptoms but no fever, etc. Did not get tested for Covid.  Swelling never completely went away. Feels like both legs, from knees down, are tight. Very tender. Worse in the evening but never completely goes away after she wakes up in the AM. Went on steroids for her shoulders and all the swelling went away for those 6 days. Felt like a new person. Swelling returned when she stopped the steroids, but again swelling decreased after she had joint injections in her shoulders.   She tried lasix initially, which did not help. Tried torsemide, first 20 mg then 40 mg. No change with 20 mg, but lost about 6 lbs on 40 mg torsemide. Usual weight 140-144 lbs. Today was 145 lbs on her home scale. Has not tried compression stockings.  Workup thus far: DVT scan negative, BNP 30, TSH 5.56, Cr/Na normal. K low, treated with supplementation. ESR normal 07/2018.   Denies chest pain, shortness of breath at rest or with normal exertion. No PND, orthopnea. No syncope or palpitations. Has noticed aches in her arms and legs. The aching is not limiting. She can ride bike 4 miles/day  FH: mother had CHF age 104, minimal issues, lived until 58. Father had COPD, died of  MI/respiratory arrest at 79. Longtime smoker.No other known disease. Sister has swelling in her legs as well, no clear diagnosis.  Past Medical History:  Diagnosis Date   Allergic bronchitis    Allergic bronchitis    Allergy    Arthritis    Claustrophobia    GERD (gastroesophageal reflux disease)    occas problem - burning, cough would take prilosec if needed   H/O hiatal hernia    HSV-2 (herpes simplex virus 2) infection    Hypothyroidism    Peripheral neuropathy    3 toes right foot 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 problem   Thyroid disease     Past Surgical History:  Procedure Laterality Date   BACK SURGERY     BREAST BIOPSY  1972   benign   CHOLECYSTECTOMY     LUMBAR LAMINECTOMY  1999   L4   LUMBAR LAMINECTOMY  1981   L5   TOTAL HIP ARTHROPLASTY Right 07/02/2012   Procedure: RIGHT TOTAL HIP ARTHROPLASTY ANTERIOR APPROACH;  Surgeon: Mcarthur Rossetti, MD;  Location: [facility];  Service: Orthopedics;  Laterality: Right;    Current Medications: Current Outpatient Medications on File Prior to Visit  Medication Sig   ALPRAZolam (XANAX) 0.5 MG tablet TAKE ONE TABLET DAILY AT BEDTIME AS NEEDED FOR SLEEP   buPROPion (WELLBUTRIN XL) 150 MG 24 hr tablet TAKE 1 TABLET BY MOUTH  EVERY DAY   glucosamine-chondroitin 500-400 MG tablet Take 1 tablet by mouth 2 (two) times daily.   levothyroxine (SYNTHROID) 112 MCG tablet Dispense as Synthroid name brand. One tab po daily on empty stomach with no other meds or food   Multiple Vitamin (MULTIVITAMIN) tablet Take 1 tablet by mouth daily.    Multiple Vitamins-Minerals (PRESERVISION AREDS 2 PO) Take by mouth.   OVER THE COUNTER MEDICATION Vitamin D 3  One daily   potassium chloride SA (K-DUR) 20 MEQ tablet Take 1 tablet (20 mEq total) by mouth daily.   torsemide (DEMADEX) 20 MG tablet Take 20 mg by mouth 2 (two) times daily.   valACYclovir (VALTREX) 500 MG tablet Take 1 tablet (500 mg total) by mouth daily.   No  current facility-administered medications on file prior to visit.      Allergies:   Betadine [povidone iodine], Penicillins, and Levofloxacin   Social History   Socioeconomic History   Marital status: Married    Spouse name: Not on file   Number of children: Not on file   Years of education: Not on file   Highest education level: Not on file  Occupational History   Not on file  Social Needs   Financial resource strain: Not on file   Food insecurity    Worry: Not on file    Inability: Not on file   Transportation needs    Medical: Not on file    Non-medical: Not on file  Tobacco Use   Smoking status: Never Smoker   Smokeless tobacco: Never Used  Substance and Sexual Activity   Alcohol use: Yes    Comment: socially   Drug use: No   Sexual activity: Not on file  Lifestyle   Physical activity    Days per week: Not on file    Minutes per session: Not on file   Stress: Not on file  Relationships   Social connections    Talks on phone: Not on file    Gets together: Not on file    Attends religious service: Not on file    Active member of club or organization: Not on file    Attends meetings of clubs or organizations: Not on file    Relationship status: Not on file  Other Topics Concern   Not on file  Social History Narrative   Not on file     Family History: The patient's family history includes Asthma in her daughter; Heart disease in her brother and father; Hypertension in her sister and sister.  ROS:   Please see the history of present illness.  Additional pertinent ROS:  Constitutional: Negative for chills, fever, night sweats, unintentional weight loss  HENT: Negative for ear pain and hearing loss.   Eyes: Negative for loss of vision and eye pain.  Respiratory: Negative for cough, sputum, shortness of breath, wheezing.   Cardiovascular: See HPI. Gastrointestinal: Negative for abdominal pain, melena, and hematochezia.  Genitourinary:  Negative for dysuria and hematuria.  Musculoskeletal: Negative for falls, positive for myalgias.  Skin: Negative for itching and rash.  Neurological: Negative for focal weakness, focal sensory changes and loss of consciousness.  Endo/Heme/Allergies: Does not bruise/bleed easily.    EKGs/Labs/Other Studies Reviewed:    The following studies were reviewed today: Labs, notes from PCP  EKG:  EKG is personally reviewed.  The ekg ordered today demonstrates normal sinus rhythm  Recent Labs: 12/29/2017: ALT 21; Hemoglobin 14.6; Platelets 282 08/23/2018: Brain Natriuretic Peptide 30; BUN 19; Creat  0.81; Potassium 3.8; Sodium 138; TSH 5.56  Recent Lipid Panel    Component Value Date/Time   CHOL 194 12/29/2017 0905   TRIG 81 12/29/2017 0905   HDL 82 12/29/2017 0905   CHOLHDL 2.4 12/29/2017 0905   VLDL 13 10/02/2015 1103   LDLCALC 95 12/29/2017 0905    Physical Exam:    VS:  BP 132/72    Pulse 72    Temp 97.7 F (36.5 C)    Ht 5' 4.5" (1.638 m)    Wt 152 lb (68.9 kg)    BMI 25.69 kg/m     Wt Readings from Last 3 Encounters:  10/15/18 152 lb (68.9 kg)  08/23/18 153 lb (69.4 kg)  07/20/18 147 lb (66.7 kg)   GEN: Well nourished, well developed in no acute distress HEENT: Normal NECK: No JVD; No carotid bruits LYMPHATICS: No lymphadenopathy CARDIAC: regular rhythm, normal S1 and S2, no murmurs, rubs, gallops. Radial pulses 2+ bilaterally. Her right DP and PT pulses are palpable, but I cannot palpate her left pulses through the edema. Both feet are moderately warm, normal capillary refill. RESPIRATORY:  Clear to auscultation without rales, wheezing or rhonchi  ABDOMEN: Soft, non-tender, non-distended MUSCULOSKELETAL:  Trace LE edema on the right, 1+ edema on the left lower extremity SKIN: Warm and dry NEUROLOGIC:  Alert and oriented x 3 PSYCHIATRIC:  Normal affect   ASSESSMENT:    1. Bilateral leg edema   2. Decreased pulse   3. Cardiac risk counseling   4. Encounter for  education about heart failure   5. Counseling on health promotion and disease prevention    PLAN:    Bilateral LE edema: reports sudden onset, better with steroids, minimally responsive to diuretics. Atypical presentation for heart failure, but hasn't been excluded. BNP was normal, and she is normal BMI. Response to steroids makes it sound inflammatory/vasculitic, but ESR normal.   Cannot palpate pulse on left foot due to edema, so this may also be PAD  Plan: -Echocardiogram to exclude LV dysfunction -ABIs for PAD -weight based dosing of torsemide -compression stockings -given heart failure education with her AVS  Cardiac risk counseling and prevention recommendations: -recommend heart healthy/Mediterranean diet, with whole grains, fruits, vegetable, fish, lean meats, nuts, and olive oil. Limit salt. -recommend moderate walking, 3-5 times/week for 30-50 minutes each session. Aim for at least 150 minutes.week. Goal should be pace of 3 miles/hours, or walking 1.5 miles in 30 minutes -recommend avoidance of tobacco products. Avoid excess alcohol. -ASCVD risk score: The 10-year ASCVD risk score Mikey Bussing DC Brooke Bonito., et al., 2013) is: 17%   Values used to calculate the score:     Age: 18 years     Sex: Female     Is Non-Hispanic African American: No     Diabetic: No     Tobacco smoker: No     Systolic Blood Pressure: 097 mmHg     Is BP treated: No     HDL Cholesterol: 82 mg/dL     Total Cholesterol: 194 mg/dL   This is largely driven by age, and she does not currently have other risk factors. However, if cardiovascular testing abnormal, consider statin  Plan for follow up: 3 mos or sooner  Medication Adjustments/Labs and Tests Ordered: Current medicines are reviewed at length with the patient today.  Concerns regarding medicines are outlined above.  Orders Placed This Encounter  Procedures   EKG 12-Lead   ECHOCARDIOGRAM COMPLETE   No orders of the defined types were  placed in this  encounter.   Patient Instructions  Medication Instructions:  Your Physician recommend you continue on your current medication as directed.    If you need a refill on your cardiac medications before your next appointment, please call your pharmacy.   Lab work: None  Testing/Procedures: Your physician has requested that you have an echocardiogram. Echocardiography is a painless test that uses sound waves to create images of your heart. It provides your doctor with information about the size and shape of your heart and how well your hearts chambers and valves are working. This procedure takes approximately one hour. There are no restrictions for this procedure. [place] has requested that you have an ankle brachial index (ABI). During this test an ultrasound and blood pressure cuff are used to evaluate the arteries that supply the arms and legs with blood. Allow thirty minutes for this exam. There are no restrictions or special instructions. [place]. [address]   Follow-Up: At [facility], you and your health needs are our priority.  As part of our continuing mission to provide you with exceptional heart care, we have created designated Provider Care Teams.  These Care Teams include your primary Cardiologist (physician) and Advanced Practice Providers (APPs -  Physician Assistants and Nurse Practitioners) who all work together to provide you with the care you need, when you need it. You will need a follow up appointment in 3 months.  Please call our office 2 months in advance to schedule this appointment.  You may see Dr. Harrell Gave or one of the following Advanced Practice Providers on your designated Care Team:   Rosaria Ferries, PA-C  Jory Sims, DNP, ANP       Signed, Buford Dresser, MD PhD 10/15/2018 8:01 PM    [place]

## 2018-10-15 ENCOUNTER — Encounter: Payer: Self-pay | Admitting: Cardiology

## 2018-10-15 ENCOUNTER — Ambulatory Visit (INDEPENDENT_AMBULATORY_CARE_PROVIDER_SITE_OTHER): Payer: Medicare Other | Admitting: Cardiology

## 2018-10-15 ENCOUNTER — Other Ambulatory Visit: Payer: Self-pay

## 2018-10-15 VITALS — BP 132/72 | HR 72 | Temp 97.7°F | Ht 64.5 in | Wt 152.0 lb

## 2018-10-15 DIAGNOSIS — R0989 Other specified symptoms and signs involving the circulatory and respiratory systems: Secondary | ICD-10-CM

## 2018-10-15 DIAGNOSIS — R600 Localized edema: Secondary | ICD-10-CM

## 2018-10-15 DIAGNOSIS — Z7189 Other specified counseling: Secondary | ICD-10-CM

## 2018-10-15 NOTE — Patient Instructions (Addendum)
Medication Instructions:  Your Physician recommend you continue on your current medication as directed.    If you need a refill on your cardiac medications before your next appointment, please call your pharmacy.   Lab work: None  Testing/Procedures: Your physician has requested that you have an echocardiogram. Echocardiography is a painless test that uses sound waves to create images of your heart. It provides your doctor with information about the size and shape of your heart and how well your heart's chambers and valves are working. This procedure takes approximately one hour. There are no restrictions for this procedure. [place] has requested that you have an ankle brachial index (ABI). During this test an ultrasound and blood pressure cuff are used to evaluate the arteries that supply the arms and legs with blood. Allow thirty minutes for this exam. There are no restrictions or special instructions. [place]. [address]   Follow-Up: At [facility], you and your health needs are our priority.  As part of our continuing mission to provide you with exceptional heart care, we have created designated Provider Care Teams.  These Care Teams include your primary Cardiologist (physician) and Advanced Practice Providers (APPs -  Physician Assistants and Nurse Practitioners) who all work together to provide you with the care you need, when you need it. You will need a follow up appointment in 3 months.  Please call our office 2 months in advance to schedule this appointment.  You may see Dr. Harrell Gave or one of the following Advanced Practice Providers on your designated Care Team:   Rosaria Ferries, PA-C . Jory Sims, DNP, ANP

## 2018-10-20 ENCOUNTER — Other Ambulatory Visit (HOSPITAL_COMMUNITY): Payer: Self-pay | Admitting: Cardiology

## 2018-10-20 ENCOUNTER — Other Ambulatory Visit: Payer: Self-pay | Admitting: Cardiology

## 2018-10-20 DIAGNOSIS — R0989 Other specified symptoms and signs involving the circulatory and respiratory systems: Secondary | ICD-10-CM

## 2018-10-22 ENCOUNTER — Other Ambulatory Visit: Payer: Self-pay

## 2018-10-22 ENCOUNTER — Ambulatory Visit (HOSPITAL_COMMUNITY): Payer: Medicare Other | Attending: Internal Medicine

## 2018-10-22 DIAGNOSIS — R600 Localized edema: Secondary | ICD-10-CM

## 2018-10-27 ENCOUNTER — Other Ambulatory Visit: Payer: Self-pay

## 2018-10-27 ENCOUNTER — Ambulatory Visit (INDEPENDENT_AMBULATORY_CARE_PROVIDER_SITE_OTHER): Payer: Medicare Other | Admitting: Physician Assistant

## 2018-10-27 ENCOUNTER — Ambulatory Visit (INDEPENDENT_AMBULATORY_CARE_PROVIDER_SITE_OTHER): Payer: Medicare Other

## 2018-10-27 ENCOUNTER — Encounter: Payer: Self-pay | Admitting: Physician Assistant

## 2018-10-27 VITALS — Ht 64.5 in | Wt 146.0 lb

## 2018-10-27 DIAGNOSIS — M79642 Pain in left hand: Secondary | ICD-10-CM

## 2018-10-27 NOTE — Progress Notes (Signed)
Office Visit Note   Patient: [name]           Date of Birth: [birth year]           MRN: [id number] Visit Date: 10/27/2018              Requested by: Elby Showers, MD [address] [place],  [place] [address] PCP: Elby Showers, MD   Assessment & Plan: Visit Diagnoses:  1. Pain in left hand     Plan: Offered injection for carpal tunnel syndrome she defers.  She did like to try conservative treatment with her husband has a night splint that she like to try at home.  She also has Voltaren gel that she will apply to the area 4 times daily.  Asked her to pick up vitamin B6 100 mg and take this twice daily.  If her symptoms do not improve would recommend EMG nerve studies to evaluate carpal tunnel syndrome.  Follow-Up Instructions: Return if symptoms worsen or fail to improve.   Orders:  Orders Placed This Encounter  Procedures  . XR Hand Complete Left   No orders of the defined types were placed in this encounter.     Procedures: No procedures performed   Clinical Data: No additional findings.   Subjective: Chief Complaint  Patient presents with  . Left Hand - Pain    HPI [name] is a 73 year old female comes in today with left hand pain.  She states that her ring and long finger have some numbness tingling in them.  This wakes her up at night.  She feels that she is losing strength in the left hand.  She is left-hand dominant.  Pains has been ongoing for the last 4 weeks.  No known injury.  No radicular symptoms down the arm.  Review of Systems See HPI   Objective: Vital Signs: Ht 5' 4.5" (1.638 m)   Wt 146 lb (66.2 kg)   BMI 24.67 kg/m   Physical Exam Constitutional:      Appearance: She is not ill-appearing or diaphoretic.  Cardiovascular:     Pulses: Normal pulses.  Pulmonary:     Effort: Pulmonary effort is normal.  Neurological:     Mental Status: She is alert and oriented to person, place, and time.     Ortho Exam Positive  Phalen's on the left negative on the right.  Compression test positive on the left over the median nerve.  Tinel's over the median nerve on the left is negative.  Is full range of motion of bilateral hands.  Subjective decreased sensation the ulnar side of the ring finger and the tip of the long finger.  Otherwise sensation intact bilateral hands.  Full range of motion bilateral elbows.  Full supination pronation bilateral forearms.  Radial pulses are 2+ and equal and symmetric peer Specialty Comments:  No specialty comments available.  Imaging: Xr Hand Complete Left  Result Date: 10/27/2018 3 views left hand: No acute fractures.  No significant arthritic changes or bony abnormalities.    PMFS History: Patient Active Problem List   Diagnosis Date Noted  . Dependent edema 09/04/2018  . Left forearm pain 08/26/2018  . Asymptomatic postmenopausal estrogen deficiency 01/03/2018  . History of hip replacement, total, unspecified laterality 06/22/2017  . Impingement syndrome of right shoulder 08/04/2016  . Bilateral sensorineural hearing loss 01/09/2016  . Bilateral tinnitus 01/09/2016  . Age-related nuclear cataract of both eyes 09/21/2015  . Choroidal nevus, left eye 09/21/2015  .  Chronic dryness of both eyes 09/21/2015  . Dermatochalasis of eyelid 09/21/2015  . Presbyopia of both eyes 09/21/2015  . PVD (posterior vitreous detachment), both eyes 09/21/2015  . Regular astigmatism of both eyes 09/21/2015  . Lateral cystocele 07/23/2015  . Rectocele 07/23/2015  . Osteoarthritis of right hip 05/24/2012  . GE reflux 09/23/2011  . Hypothyroidism 01/07/2011  . Anxiety 01/07/2011  . Allergic rhinitis 01/07/2011  . 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 Allergic bronchitis   . Allergic bronchitis   . Allergy   . Arthritis   . Claustrophobia   . GERD (gastroesophageal reflux disease)    occas problem - burning, cough would take prilosec if  needed  . H/O hiatal hernia   . HSV-2 (herpes simplex virus 2) infection   . Hypothyroidism   . Peripheral neuropathy    3 toes right foot 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 problem  . Thyroid disease     Family History  Problem Relation Age of Onset  . Heart disease Father   . Hypertension Sister   . Heart disease Brother   . Asthma Daughter   . Hypertension Sister     Past Surgical History:  Procedure Laterality Date  . BACK SURGERY    . BREAST BIOPSY  1972   benign  . CHOLECYSTECTOMY    . LUMBAR LAMINECTOMY  1999   L4  . LUMBAR LAMINECTOMY  1981   L5  . TOTAL HIP ARTHROPLASTY Right 07/02/2012   Procedure: RIGHT TOTAL HIP ARTHROPLASTY ANTERIOR APPROACH;  Surgeon: Mcarthur Rossetti, MD;  Location: [facility];  Service: Orthopedics;  Laterality: Right;   Social History   Occupational History  . Not on file  Tobacco Use  . Smoking status: Never Smoker  . Smokeless tobacco: Never Used  Substance and Sexual Activity  . Alcohol use: Yes    Comment: socially  . Drug use: No  . Sexual activity: Not on file

## 2018-11-03 ENCOUNTER — Ambulatory Visit (HOSPITAL_COMMUNITY)
Admission: RE | Admit: 2018-11-03 | Discharge: 2018-11-03 | Disposition: A | Discharge status: Home / Self Care | Payer: Medicare Other | Source: Ambulatory Visit | Attending: Cardiovascular Disease | Admitting: Cardiovascular Disease

## 2018-11-03 ENCOUNTER — Other Ambulatory Visit: Payer: Self-pay

## 2018-11-03 DIAGNOSIS — R0989 Other specified symptoms and signs involving the circulatory and respiratory systems: Secondary | ICD-10-CM | POA: Diagnosis not present

## 2018-11-05 ENCOUNTER — Other Ambulatory Visit: Payer: Medicare Other | Admitting: Internal Medicine

## 2018-11-05 ENCOUNTER — Other Ambulatory Visit: Payer: Self-pay

## 2018-11-05 DIAGNOSIS — Z20828 Contact with and (suspected) exposure to other viral communicable diseases: Secondary | ICD-10-CM

## 2018-11-05 DIAGNOSIS — Z20822 Contact with and (suspected) exposure to covid-19: Secondary | ICD-10-CM

## 2018-11-08 LAB — SAR COV2 SEROLOGY (COVID19)AB(IGG),IA: SARS CoV2 AB IGG: NEGATIVE

## 2018-11-10 ENCOUNTER — Telehealth: Payer: Self-pay | Admitting: Internal Medicine

## 2018-11-10 ENCOUNTER — Encounter: Payer: Self-pay | Admitting: Internal Medicine

## 2018-11-10 NOTE — Telephone Encounter (Addendum)
[name] [phone]  [name] called to say that Dr Harrell Gave wants her to follow back up with you about the rash, swelling and turning blue on her ankles.   I think this is chronic venous insufficiency and she should see Dermatologist about the rash. I have no other suggestions. She has had a thorough work up.

## 2018-11-12 NOTE — Telephone Encounter (Signed)
Called [name] back scheduled appointment for 11/24/18, she is going to call dermatologist to get an appointment about rash.

## 2018-11-12 NOTE — Telephone Encounter (Signed)
Spoke with [name] this morning she would like to come in next week and talk to you about Chronic Venous insufficiency before going to dermatologist.

## 2018-11-12 NOTE — Telephone Encounter (Signed)
We are booked solid. Sept 9 or 16th  Are available.

## 2018-11-16 ENCOUNTER — Other Ambulatory Visit: Payer: Self-pay | Admitting: Internal Medicine

## 2018-11-17 ENCOUNTER — Telehealth: Payer: Self-pay | Admitting: Internal Medicine

## 2018-11-17 ENCOUNTER — Other Ambulatory Visit: Payer: Self-pay

## 2018-11-17 DIAGNOSIS — R202 Paresthesia of skin: Secondary | ICD-10-CM

## 2018-11-17 DIAGNOSIS — M79641 Pain in right hand: Secondary | ICD-10-CM | POA: Diagnosis not present

## 2018-11-17 DIAGNOSIS — G5602 Carpal tunnel syndrome, left upper limb: Secondary | ICD-10-CM | POA: Insufficient documentation

## 2018-11-17 DIAGNOSIS — M79642 Pain in left hand: Secondary | ICD-10-CM | POA: Diagnosis not present

## 2018-11-17 MED ORDER — TORSEMIDE 20 MG PO TABS: 20.0000 mg | ORAL_TABLET | Freq: Every day | ORAL | 1 refills | Status: DC

## 2018-11-17 NOTE — Telephone Encounter (Signed)
[name] [phone]  [facility]  torsemide (DEMADEX) 40 mg  [name] called to say when she was in that Dr Renold Genta increased her dose from 20 mg to 40 mg so she has been doubling up on the ones she had and now she is out and needs the 40 mg sent to pharmacy.

## 2018-11-18 DIAGNOSIS — I872 Venous insufficiency (chronic) (peripheral): Secondary | ICD-10-CM | POA: Diagnosis not present

## 2018-11-18 DIAGNOSIS — I8311 Varicose veins of right lower extremity with inflammation: Secondary | ICD-10-CM | POA: Diagnosis not present

## 2018-11-18 DIAGNOSIS — I8312 Varicose veins of left lower extremity with inflammation: Secondary | ICD-10-CM | POA: Diagnosis not present

## 2018-11-18 MED ORDER — TORSEMIDE 20 MG PO TABS: 20.0000 mg | ORAL_TABLET | Freq: Two times a day (BID) | ORAL | 1 refills | Status: DC

## 2018-11-18 NOTE — Addendum Note (Signed)
Addended by: Mady Haagensen on: 11/18/2018 02:28 PM   Modules accepted: Orders

## 2018-11-18 NOTE — Telephone Encounter (Signed)
Verify that 40 mg has been sent to pharmacy and let patient know

## 2018-11-18 NOTE — Telephone Encounter (Signed)
Appointment canceled for 11/24/18 with Dr Renold Genta and referral put in for [facility] [phone], if you have not heard from them by end of next week call them and schedule appointment

## 2018-11-23 ENCOUNTER — Other Ambulatory Visit: Payer: Self-pay

## 2018-11-23 ENCOUNTER — Other Ambulatory Visit: Payer: Self-pay | Admitting: Internal Medicine

## 2018-11-23 DIAGNOSIS — I878 Other specified disorders of veins: Secondary | ICD-10-CM

## 2018-11-24 ENCOUNTER — Ambulatory Visit: Payer: Medicare Other | Admitting: Internal Medicine

## 2018-11-24 ENCOUNTER — Telehealth (HOSPITAL_COMMUNITY): Payer: Self-pay

## 2018-11-24 NOTE — Telephone Encounter (Signed)

## 2018-11-25 ENCOUNTER — Other Ambulatory Visit: Payer: Self-pay

## 2018-11-25 ENCOUNTER — Ambulatory Visit (HOSPITAL_COMMUNITY)
Admission: RE | Admit: 2018-11-25 | Discharge: 2018-11-25 | Disposition: A | Discharge status: Home / Self Care | Payer: Medicare Other | Source: Ambulatory Visit | Attending: Family | Admitting: Family

## 2018-11-25 ENCOUNTER — Ambulatory Visit (INDEPENDENT_AMBULATORY_CARE_PROVIDER_SITE_OTHER): Payer: Medicare Other | Admitting: Physician Assistant

## 2018-11-25 VITALS — BP 116/65 | HR 74 | Temp 97.8°F | Resp 16 | Ht 64.5 in | Wt 158.0 lb

## 2018-11-25 DIAGNOSIS — I878 Other specified disorders of veins: Secondary | ICD-10-CM | POA: Insufficient documentation

## 2018-11-25 DIAGNOSIS — I872 Venous insufficiency (chronic) (peripheral): Secondary | ICD-10-CM | POA: Diagnosis not present

## 2018-11-25 DIAGNOSIS — R609 Edema, unspecified: Secondary | ICD-10-CM

## 2018-11-25 NOTE — Progress Notes (Signed)
Requested by:  Elby Showers, MD [address] [place],  [place] [address]  Reason for consultation: Edema of legs and feet   History of Present Illness   [name] is a 73 y.o. (04-22-1945) female who presents with chief complaint of swelling in bilateral lower extremities, left more so than right.  Patient states that she has noticed edema with associated pain in bilateral lower extremities since around March when she stopped exercising in her fitness center due to [place].  She denies any visible varicosities however does notice a cluster of spider veins around her left proximal shin.  She denies any family history of varicose veins, history of DVT, history of venous ulcers.  She purchased thigh-high compression last week and has been wearing them daily.  Patient states she however has more pain in her knees now and is wondering if thigh-high compression would be more beneficial.  She denies any tobacco use.  It should also be noted that patient had a negative cardiac work-up for edema bilateral lower extremities.  Her cardiologist also included ABIs with segmental arterial duplex which demonstrated triphasic waveforms throughout the bilateral lower extremities with normal ABIs.  Past Medical History:  Diagnosis Date  . Allergic bronchitis   . Allergic bronchitis   . Allergy   . Arthritis   . Claustrophobia   . GERD (gastroesophageal reflux disease)    occas problem - burning, cough would take prilosec if needed  . H/O hiatal hernia   . HSV-2 (herpes simplex virus 2) infection   . Hypothyroidism   . Peripheral neuropathy    3 toes right foot 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 problem  . Thyroid disease     Past Surgical History:  Procedure Laterality Date  . BACK SURGERY    . BREAST BIOPSY  1972   benign  . CHOLECYSTECTOMY    . LUMBAR LAMINECTOMY  1999   L4  . LUMBAR LAMINECTOMY  1981   L5  . TOTAL HIP ARTHROPLASTY Right 07/02/2012   Procedure: RIGHT TOTAL HIP  ARTHROPLASTY ANTERIOR APPROACH;  Surgeon: Mcarthur Rossetti, MD;  Location: [facility];  Service: Orthopedics;  Laterality: Right;    Social History   Socioeconomic History  . Marital status: Married    Spouse name: Not on file  . Number of children: Not on file  . Years of education: Not on file  . Highest education level: Not on file  Occupational History  . Not on file  Social Needs  . Financial resource strain: Not on file  . Food insecurity    Worry: Not on file    Inability: Not on file  . Transportation needs    Medical: Not on file    Non-medical: Not on file  Tobacco Use  . Smoking status: Never Smoker  . Smokeless tobacco: Never Used  Substance and Sexual Activity  . Alcohol use: Yes    Comment: socially  . Drug use: No  . Sexual activity: Not on file  Lifestyle  . Physical activity    Days per week: Not on file    Minutes per session: Not on file  . Stress: Not on file  Relationships  . Social Herbalist on phone: Not on file    Gets together: Not on file    Attends religious service: Not on file    Active member of club or organization: Not on file    Attends meetings of clubs or organizations: Not  on file    Relationship status: Not on file  . Intimate partner violence    Fear of current or ex partner: Not on file    Emotionally abused: Not on file    Physically abused: Not on file    Forced sexual activity: Not on file  Other Topics Concern  . Not on file  Social History Narrative  . Not on file    Family History  Problem Relation Age of Onset  . Heart disease Father   . Hypertension Sister   . Heart disease Brother   . Asthma Daughter   . Hypertension Sister     Current Outpatient Medications  Medication Sig Dispense Refill  . ALPRAZolam (XANAX) 0.5 MG tablet TAKE ONE TABLET DAILY AT BEDTIME AS NEEDED FOR SLEEP 90 tablet 5  . buPROPion (WELLBUTRIN XL) 150 MG 24 hr tablet TAKE 1 TABLET BY MOUTH EVERY DAY 90 tablet 3  .  glucosamine-chondroitin 500-400 MG tablet Take 1 tablet by mouth 2 (two) times daily.    . Multiple Vitamin (MULTIVITAMIN) tablet Take 1 tablet by mouth daily.     . Multiple Vitamins-Minerals (PRESERVISION AREDS 2 PO) Take by mouth.    Marland Kitchen OVER THE COUNTER MEDICATION Vitamin D 3  One daily    . potassium chloride SA (K-DUR) 20 MEQ tablet TAKE 1 TABLET BY MOUTH EVERY DAY 90 tablet 0  . SYNTHROID 112 MCG tablet TAKE 1 TABLET BY MOUTH EVERY DAY ON AN EMPTY STOMACH WITH NO OTHER MEDS OR FOOD 90 tablet 0  . torsemide (DEMADEX) 20 MG tablet Take 1 tablet (20 mg total) by mouth 2 (two) times daily. 180 tablet 1  . valACYclovir (VALTREX) 500 MG tablet Take 1 tablet (500 mg total) by mouth daily. 90 tablet 3   No current facility-administered medications for this visit.     Allergies  Allergen Reactions  . Betadine [Povidone Iodine] Rash and Other (See Comments)    blisters  . Penicillins Anaphylaxis  . Levofloxacin Other (See Comments)    Muscle weakness, couldn't move well    REVIEW OF SYSTEMS (negative unless checked):   Cardiac:  []  Chest pain or chest pressure? []  Shortness of breath upon activity? []  Shortness of breath when lying flat? []  Irregular heart rhythm?  Vascular:  []  Pain in calf, thigh, or hip brought on by walking? []  Pain in feet at night that wakes you up from your sleep? []  Blood clot in your veins? [x]  Leg swelling?  Pulmonary:  []  Oxygen at home? []  Productive cough? []  Wheezing?  Neurologic:  []  Sudden weakness in arms or legs? []  Sudden numbness in arms or legs? []  Sudden onset of difficult speaking or slurred speech? []  Temporary loss of vision in one eye? []  Problems with dizziness?  Gastrointestinal:  []  Blood in stool? []  Vomited blood?  Genitourinary:  []  Burning when urinating? []  Blood in urine?  Psychiatric:  []  Major depression  Hematologic:  []  Bleeding problems? []  Problems with blood clotting?  Dermatologic:  []  Rashes or  ulcers?  Constitutional:  []  Fever or chills?  Ear/Nose/Throat:  []  Change in hearing? []  Nose bleeds? []  Sore throat?  Musculoskeletal:  []  Back pain? [x]  Joint pain? []  Muscle pain?   Physical Examination     Vitals:   11/25/18 1357  BP: 116/65  Pulse: 74  Resp: 16  Temp: 97.8 F (36.6 C)  TempSrc: Temporal  SpO2: 99%  Weight: 158 lb (71.7 kg)  Height: 5' 4.5" (1.638 m)  Body mass index is 26.7 kg/m.  General Alert, O x 3, WD, NAD  Head [place]/AT,    Neck Supple, mid-line trachea,    Pulmonary Sym exp, good B air movt, CTA B  Cardiac RRR, Nl S1, S2  Vascular Vessel Right Left  Radial Palpable Palpable  Brachial Palpable Palpable  Aorta Not palpable N/A  Popliteal Not palpable Not palpable  PT Palpable Palpable    Gastro- intestinal soft, non-distended, non-tender to palpation,   Musculo- skeletal M/S 5/5 throughout  , Extremities without ischemic changes  , Pitting edema present: up to the level of the knee bilaterally, No visible varicosities , spider vein cluster L proximal lateral shin, no venous ulcers  Neurologic Cranial nerves 2-12 intact , Pain and light touch intact in extremities , Motor exam as listed above  Psychiatric Judgement intact, Mood & affect appropriate for pt's clinical situation  Dermatologic See M/S exam for extremity exam, No rashes otherwise noted  Lymphatic  Palpable lymph nodes: None    Non-invasive Vascular Imaging   BLE Venous Insufficiency Duplex (11/25/18):   RLE:   Negative DVT and SVT,   Saphenofemoral junction GSV reflux,   Negative SSV reflux,  Negative deep venous reflux  LLE:  Negative DVT and SVT,   Saphenofemoral junction to distal thigh GSV reflux,   Entire SSV reflux,  Common femoral vein deep venous reflux   Medical Decision Making   KELCIE BANASZAK is a 73 y.o. female who presents with: BLE chronic venous insufficiency (C3)   Patient was measured for and recommended thigh-high compression  stockings to be worn daily  Encouraged patient to elevate her legs whenever possible during the day and to avoid periods of prolonged sitting or standing  Based on venous reflux study patient may be a candidate for left greater saphenous vein ablation  We will proceed with conservative therapy for 3 months at which time she will return for consultation with Dr. Terri Skains PA-C [facility] Office: [phone]  11/25/2018, 2:51 PM  Clinic MD: Venous reflux study reviewed with Dr. Oneida Alar

## 2018-12-07 ENCOUNTER — Other Ambulatory Visit: Payer: Self-pay

## 2018-12-07 ENCOUNTER — Ambulatory Visit (INDEPENDENT_AMBULATORY_CARE_PROVIDER_SITE_OTHER): Payer: Medicare Other | Admitting: Neurology

## 2018-12-07 DIAGNOSIS — R202 Paresthesia of skin: Secondary | ICD-10-CM | POA: Diagnosis not present

## 2018-12-07 DIAGNOSIS — G5603 Carpal tunnel syndrome, bilateral upper limbs: Secondary | ICD-10-CM

## 2018-12-07 NOTE — Procedures (Signed)
[facility]  [address], [place]  [place], [place] [address] Tel: [phone] Fax:  [phone] Test Date:  12/07/2018  Patient: [name] DOB: 03/03/1946 Physician: Narda Amber, DO  Sex: Female Height: 5\' 5"  Ref Phys: Leanora Cover, MD  ID#: [id number] Temp: 32.0C Technician:    Patient Complaints: This is a 73 year old female referred for evaluation of bilateral hand tingling, numbness, and weakness, worse on the left.  NCV & EMG Findings: Extensive electrodiagnostic testing of the left upper extremity and additional studies of the right shows:  1. Left median sensory response is absent.  Right median sensory response shows prolonged distal peak latency (4.7 ms).  Bilateral ulnar sensory responses are within normal limits. 2. Left median motor response is absent.  Right median motor response shows prolonged distal onset latency (5.0 ms) and reduced amplitude (4.6 mV).  Of note, there is evidence of bilateral median-to-ulnar crossover in the forearm as seen by a motor response stimulating at the ulnar-wrist and recording at the abductor pollicis brevis muscle.  Bilateral ulnar motor responses are within normal limits.   3. 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    Impression: 1. Left median neuropathy at or distal to the wrist (very severe), consistent with a clinical diagnosis of carpal tunnel syndrome.   2. Right median neuropathy at or distal to the wrist (moderate-to-severe), consistent with a clinical diagnosis of carpal tunnel syndrome.   3. Incidentally, there is evidence of bilateral Martin-Gruber anastomoses, a normal anatomic variant.   ___________________________ Narda Amber, DO    Nerve Conduction Studies Anti Sensory Summary Table   Site NR Peak (ms) Norm Peak (ms) P-T Amp (V) Norm P-T Amp  Left Median Anti Sensory (2nd Digit)  32C  Wrist NR   <3.8  >10  Right Median Anti Sensory (2nd Digit)  32C  Wrist    4.7 <3.8 12.3 >10  Left Ulnar Anti Sensory (5th Digit)  32C  Wrist    3.2 <3.2 33.0 >5  Right Ulnar Anti Sensory (5th Digit)  32C  Wrist    2.6 <3.2 29.1 >5   Motor Summary Table   Site NR Onset (ms) Norm Onset (ms) O-P Amp (mV) Norm O-P Amp Site1 Site2 Delta-0 (ms) Dist (cm) Vel (m/s) Norm Vel (m/s)  Left Median Motor (Abd Poll Brev)  32C  Wrist NR  <4.0  >5 Elbow Wrist  0.0  >50  Elbow NR     Ulnar-wrist crossover Elbow  0.0    Ulnar-wrist crossover    3.9  5.7         Right Median Motor (Abd Poll Brev)  32C  Wrist    5.0 <4.0 4.6 >5 Elbow Wrist 4.8 29.0 60 >50  Elbow    9.8  4.6  Ulnar-wrist crossover Elbow 6.0 0.0    Ulnar-wrist crossover    3.8  2.9         Left Ulnar Motor (Abd Dig Minimi)  32C  Wrist    3.0 <3.1 8.6 >7 B Elbow Wrist 4.3 22.0 51 >50  B Elbow    7.3  8.0  A Elbow B Elbow 1.9 10.0 53 >50  A Elbow    9.2  7.4         Right Ulnar Motor (Abd Dig Minimi)  32C  Wrist    1.8 <3.1 8.8 >7 B Elbow Wrist 4.0 22.0 55 >50  B  Elbow    5.8  8.1  A Elbow B Elbow 1.8 10.0 56 >50  A Elbow    7.6  7.8          EMG   Side Muscle Ins Act Fibs Psw Fasc Number Recrt Dur Dur. Amp Amp. Poly Poly. Comment  Left 1stDorInt Nml Nml Nml Nml Nml Nml Nml Nml Nml Nml Nml Nml N/A  Left Abd Poll Brev Nml 2+ Nml Nml 2- Rapid Some 1+ Some 1+ Some 1+ N/A  Left PronatorTeres Nml Nml Nml Nml Nml Nml Nml Nml Nml Nml Nml Nml N/A  Left Biceps Nml Nml Nml Nml Nml Nml Nml Nml Nml Nml Nml Nml N/A  Left Triceps Nml Nml Nml Nml Nml Nml Nml Nml Nml Nml Nml Nml N/A  Left Deltoid Nml Nml Nml Nml Nml Nml Nml Nml Nml Nml Nml Nml N/A  Right 1stDorInt Nml Nml Nml Nml Nml Nml Nml Nml Nml Nml Nml Nml N/A  Right Abd Poll Brev Nml Nml Nml Nml 1- Rapid Few 1+ Few 1+ Some 1+ N/A  Right PronatorTeres Nml Nml Nml Nml Nml Nml Nml Nml Nml Nml Nml Nml N/A  Right Biceps Nml Nml Nml Nml Nml Nml Nml Nml Nml Nml Nml Nml N/A  Right Triceps Nml Nml Nml  Nml Nml Nml Nml Nml Nml Nml Nml Nml N/A  Right Deltoid Nml Nml Nml Nml Nml Nml Nml Nml Nml Nml Nml Nml N/A      Waveforms:

## 2018-12-08 ENCOUNTER — Other Ambulatory Visit: Payer: Self-pay | Admitting: Orthopedic Surgery

## 2018-12-08 DIAGNOSIS — G5603 Carpal tunnel syndrome, bilateral upper limbs: Secondary | ICD-10-CM | POA: Diagnosis not present

## 2018-12-10 ENCOUNTER — Encounter (HOSPITAL_BASED_OUTPATIENT_CLINIC_OR_DEPARTMENT_OTHER): Payer: Self-pay | Admitting: *Deleted

## 2018-12-10 ENCOUNTER — Other Ambulatory Visit: Payer: Self-pay

## 2018-12-14 ENCOUNTER — Other Ambulatory Visit: Payer: Self-pay

## 2018-12-14 ENCOUNTER — Encounter (HOSPITAL_BASED_OUTPATIENT_CLINIC_OR_DEPARTMENT_OTHER)
Admission: RE | Admit: 2018-12-14 | Discharge: 2018-12-14 | Disposition: A | Discharge status: Home / Self Care | Payer: Medicare Other | Source: Ambulatory Visit | Attending: Orthopedic Surgery | Admitting: Orthopedic Surgery

## 2018-12-14 ENCOUNTER — Other Ambulatory Visit (HOSPITAL_COMMUNITY)
Admission: RE | Admit: 2018-12-14 | Discharge: 2018-12-14 | Disposition: A | Discharge status: Home / Self Care | Payer: Medicare Other | Source: Ambulatory Visit | Attending: Orthopedic Surgery | Admitting: Orthopedic Surgery

## 2018-12-14 DIAGNOSIS — Z01812 Encounter for preprocedural laboratory examination: Secondary | ICD-10-CM | POA: Insufficient documentation

## 2018-12-14 DIAGNOSIS — E039 Hypothyroidism, unspecified: Secondary | ICD-10-CM | POA: Diagnosis not present

## 2018-12-14 DIAGNOSIS — Z20828 Contact with and (suspected) exposure to other viral communicable diseases: Secondary | ICD-10-CM | POA: Diagnosis not present

## 2018-12-14 DIAGNOSIS — M1990 Unspecified osteoarthritis, unspecified site: Secondary | ICD-10-CM | POA: Diagnosis not present

## 2018-12-14 DIAGNOSIS — Z87892 Personal history of anaphylaxis: Secondary | ICD-10-CM | POA: Diagnosis not present

## 2018-12-14 DIAGNOSIS — Z96641 Presence of right artificial hip joint: Secondary | ICD-10-CM | POA: Diagnosis not present

## 2018-12-14 DIAGNOSIS — Z881 Allergy status to other antibiotic agents status: Secondary | ICD-10-CM | POA: Diagnosis not present

## 2018-12-14 DIAGNOSIS — G5602 Carpal tunnel syndrome, left upper limb: Secondary | ICD-10-CM | POA: Diagnosis not present

## 2018-12-14 DIAGNOSIS — G629 Polyneuropathy, unspecified: Secondary | ICD-10-CM | POA: Diagnosis not present

## 2018-12-14 DIAGNOSIS — Z880 Allergy status to penicillin: Secondary | ICD-10-CM | POA: Diagnosis not present

## 2018-12-14 DIAGNOSIS — Z79899 Other long term (current) drug therapy: Secondary | ICD-10-CM | POA: Diagnosis not present

## 2018-12-14 DIAGNOSIS — Z8249 Family history of ischemic heart disease and other diseases of the circulatory system: Secondary | ICD-10-CM | POA: Diagnosis not present

## 2018-12-14 LAB — BASIC METABOLIC PANEL
Anion gap: 9 (ref 5–15)
BUN: 13 mg/dL (ref 8–23)
CO2: 29 mmol/L (ref 22–32)
Calcium: 9.1 mg/dL (ref 8.9–10.3)
Chloride: 95 mmol/L — ABNORMAL LOW (ref 98–111)
Creatinine, Ser: 0.64 mg/dL (ref 0.44–1.00)
GFR calc Af Amer: >60 mL/min (ref >60)
GFR calc non Af Amer: >60 mL/min (ref >60)
Glucose, Bld: 91 mg/dL (ref 70–99)
Potassium: 4.8 mmol/L (ref 3.5–5.1)
Sodium: 133 mmol/L — ABNORMAL LOW (ref 135–145)

## 2018-12-14 NOTE — Progress Notes (Signed)

## 2018-12-15 LAB — NOVEL CORONAVIRUS, NAA (HOSP ORDER, SEND-OUT TO REF LAB; TAT 18-24 HRS): SARS-CoV-2, NAA: NOT DETECTED

## 2018-12-17 ENCOUNTER — Ambulatory Visit (HOSPITAL_BASED_OUTPATIENT_CLINIC_OR_DEPARTMENT_OTHER): Payer: Medicare Other | Admitting: Anesthesiology

## 2018-12-17 ENCOUNTER — Encounter (HOSPITAL_BASED_OUTPATIENT_CLINIC_OR_DEPARTMENT_OTHER): Payer: Self-pay | Admitting: *Deleted

## 2018-12-17 ENCOUNTER — Ambulatory Visit (HOSPITAL_BASED_OUTPATIENT_CLINIC_OR_DEPARTMENT_OTHER)
Admission: RE | Admit: 2018-12-17 | Discharge: 2018-12-17 | Disposition: A | Discharge status: Home / Self Care | Payer: Medicare Other | Attending: Orthopedic Surgery | Admitting: Orthopedic Surgery

## 2018-12-17 ENCOUNTER — Encounter (HOSPITAL_BASED_OUTPATIENT_CLINIC_OR_DEPARTMENT_OTHER)
Admission: RE | Disposition: A | Discharge status: Home / Self Care | Payer: Self-pay | Source: Home / Self Care | Attending: Orthopedic Surgery

## 2018-12-17 DIAGNOSIS — Z79899 Other long term (current) drug therapy: Secondary | ICD-10-CM | POA: Diagnosis not present

## 2018-12-17 DIAGNOSIS — Z96641 Presence of right artificial hip joint: Secondary | ICD-10-CM | POA: Diagnosis not present

## 2018-12-17 DIAGNOSIS — E039 Hypothyroidism, unspecified: Secondary | ICD-10-CM | POA: Insufficient documentation

## 2018-12-17 DIAGNOSIS — M1990 Unspecified osteoarthritis, unspecified site: Secondary | ICD-10-CM | POA: Insufficient documentation

## 2018-12-17 DIAGNOSIS — Z8249 Family history of ischemic heart disease and other diseases of the circulatory system: Secondary | ICD-10-CM | POA: Insufficient documentation

## 2018-12-17 DIAGNOSIS — Z881 Allergy status to other antibiotic agents status: Secondary | ICD-10-CM | POA: Diagnosis not present

## 2018-12-17 DIAGNOSIS — G629 Polyneuropathy, unspecified: Secondary | ICD-10-CM | POA: Insufficient documentation

## 2018-12-17 DIAGNOSIS — Z87892 Personal history of anaphylaxis: Secondary | ICD-10-CM | POA: Insufficient documentation

## 2018-12-17 DIAGNOSIS — G5602 Carpal tunnel syndrome, left upper limb: Secondary | ICD-10-CM | POA: Insufficient documentation

## 2018-12-17 DIAGNOSIS — F419 Anxiety disorder, unspecified: Secondary | ICD-10-CM | POA: Diagnosis not present

## 2018-12-17 DIAGNOSIS — K219 Gastro-esophageal reflux disease without esophagitis: Secondary | ICD-10-CM | POA: Diagnosis not present

## 2018-12-17 DIAGNOSIS — Z880 Allergy status to penicillin: Secondary | ICD-10-CM | POA: Insufficient documentation

## 2018-12-17 HISTORY — DX: Edema, unspecified: R60.9

## 2018-12-17 HISTORY — PX: CARPAL TUNNEL RELEASE: SHX101

## 2018-12-17 SURGERY — CARPAL TUNNEL RELEASE
Anesthesia: Monitor Anesthesia Care | Site: Hand | Laterality: Left

## 2018-12-17 MED ORDER — VANCOMYCIN HCL IN DEXTROSE 1-5 GM/200ML-% IV SOLN
INTRAVENOUS | Status: AC
  Filled 2018-12-17: qty 200

## 2018-12-17 MED ORDER — PROPOFOL 10 MG/ML IV BOLUS
INTRAVENOUS | Status: DC | PRN
  Administered 2018-12-17: 30 mg via INTRAVENOUS

## 2018-12-17 MED ORDER — VANCOMYCIN HCL IN DEXTROSE 1-5 GM/200ML-% IV SOLN
1000.0000 mg | INTRAVENOUS | Status: AC
  Administered 2018-12-17: 1000 mg via INTRAVENOUS

## 2018-12-17 MED ORDER — LIDOCAINE HCL (PF) 0.5 % IJ SOLN
INTRAMUSCULAR | Status: DC | PRN
  Administered 2018-12-17: 30 mL via INTRAVENOUS

## 2018-12-17 MED ORDER — LACTATED RINGERS IV SOLN
INTRAVENOUS | Status: DC
  Administered 2018-12-17: 12:00:00 via INTRAVENOUS

## 2018-12-17 MED ORDER — HYDROCODONE-ACETAMINOPHEN 5-325 MG PO TABS: ORAL_TABLET | ORAL | 0 refills | Status: DC

## 2018-12-17 MED ORDER — MIDAZOLAM HCL 2 MG/2ML IJ SOLN
INTRAMUSCULAR | Status: AC
  Filled 2018-12-17: qty 2

## 2018-12-17 MED ORDER — CHLORHEXIDINE GLUCONATE 4 % EX LIQD: 60.0000 mL | Freq: Once | CUTANEOUS | Status: DC

## 2018-12-17 MED ORDER — FENTANYL CITRATE (PF) 100 MCG/2ML IJ SOLN: 25.0000 ug | INTRAMUSCULAR | Status: DC | PRN

## 2018-12-17 MED ORDER — FENTANYL CITRATE (PF) 100 MCG/2ML IJ SOLN
INTRAMUSCULAR | Status: AC
  Filled 2018-12-17: qty 2

## 2018-12-17 MED ORDER — MIDAZOLAM HCL 2 MG/2ML IJ SOLN
1.0000 mg | INTRAMUSCULAR | Status: DC | PRN
  Administered 2018-12-17: 1 mg via INTRAVENOUS

## 2018-12-17 MED ORDER — BUPIVACAINE HCL (PF) 0.25 % IJ SOLN
INTRAMUSCULAR | Status: DC | PRN
  Administered 2018-12-17: 8 mL

## 2018-12-17 MED ORDER — 0.9 % SODIUM CHLORIDE (POUR BTL) OPTIME
TOPICAL | Status: DC | PRN
  Administered 2018-12-17: 14:00:00 1000 mL

## 2018-12-17 MED ORDER — SCOPOLAMINE 1 MG/3DAYS TD PT72: 1.0000 | MEDICATED_PATCH | Freq: Once | TRANSDERMAL | Status: DC

## 2018-12-17 MED ORDER — FENTANYL CITRATE (PF) 100 MCG/2ML IJ SOLN
50.0000 ug | INTRAMUSCULAR | Status: DC | PRN
  Administered 2018-12-17: 50 ug via INTRAVENOUS

## 2018-12-17 SURGICAL SUPPLY — 37 items
APL PRP STRL LF DISP 70% ISPRP (MISCELLANEOUS) ×1
BLADE SURG 15 STRL LF DISP TIS (BLADE) ×2 IMPLANT
BLADE SURG 15 STRL SS (BLADE) ×6
BNDG CMPR 9X4 STRL LF SNTH (GAUZE/BANDAGES/DRESSINGS)
BNDG ELASTIC 3X5.8 VLCR STR LF (GAUZE/BANDAGES/DRESSINGS) ×3 IMPLANT
BNDG ELASTIC 4X5.8 VLCR STR LF (GAUZE/BANDAGES/DRESSINGS) ×2 IMPLANT
BNDG ESMARK 4X9 LF (GAUZE/BANDAGES/DRESSINGS) IMPLANT
BNDG GAUZE ELAST 4 BULKY (GAUZE/BANDAGES/DRESSINGS) ×3 IMPLANT
CHLORAPREP W/TINT 26 (MISCELLANEOUS) ×3 IMPLANT
CORD BIPOLAR FORCEPS 12FT (ELECTRODE) ×3 IMPLANT
COVER BACK TABLE REUSABLE LG (DRAPES) ×3 IMPLANT
COVER MAYO STAND REUSABLE (DRAPES) ×3 IMPLANT
COVER WAND RF STERILE (DRAPES) IMPLANT
CUFF TOURN SGL QUICK 18X4 (TOURNIQUET CUFF) ×3 IMPLANT
DRAPE EXTREMITY T 121X128X90 (DISPOSABLE) ×3 IMPLANT
DRAPE SURG 17X23 STRL (DRAPES) ×3 IMPLANT
DRSG PAD ABDOMINAL 8X10 ST (GAUZE/BANDAGES/DRESSINGS) ×3 IMPLANT
GAUZE SPONGE 4X4 12PLY STRL (GAUZE/BANDAGES/DRESSINGS) ×3 IMPLANT
GAUZE XEROFORM 1X8 LF (GAUZE/BANDAGES/DRESSINGS) ×3 IMPLANT
GLOVE BIO SURGEON STRL SZ7.5 (GLOVE) ×3 IMPLANT
GLOVE BIOGEL PI IND STRL 8 (GLOVE) ×1 IMPLANT
GLOVE BIOGEL PI INDICATOR 8 (GLOVE) ×2
GOWN STRL REUS W/ TWL LRG LVL3 (GOWN DISPOSABLE) ×1 IMPLANT
GOWN STRL REUS W/TWL LRG LVL3 (GOWN DISPOSABLE) ×3
GOWN STRL REUS W/TWL XL LVL3 (GOWN DISPOSABLE) ×3 IMPLANT
NDL HYPO 25X1 1.5 SAFETY (NEEDLE) ×1 IMPLANT
NEEDLE HYPO 25X1 1.5 SAFETY (NEEDLE) ×3 IMPLANT
NS IRRIG 1000ML POUR BTL (IV SOLUTION) ×3 IMPLANT
PACK BASIN DAY SURGERY FS (CUSTOM PROCEDURE TRAY) ×3 IMPLANT
PADDING CAST ABS 4INX4YD NS (CAST SUPPLIES) ×2
PADDING CAST ABS COTTON 4X4 ST (CAST SUPPLIES) ×1 IMPLANT
STOCKINETTE 4X48 STRL (DRAPES) ×3 IMPLANT
SUT ETHILON 4 0 PS 2 18 (SUTURE) ×3 IMPLANT
SYR BULB 3OZ (MISCELLANEOUS) ×3 IMPLANT
SYR CONTROL 10ML LL (SYRINGE) ×3 IMPLANT
TOWEL GREEN STERILE FF (TOWEL DISPOSABLE) ×6 IMPLANT
UNDERPAD 30X36 HEAVY ABSORB (UNDERPADS AND DIAPERS) ×3 IMPLANT

## 2018-12-17 NOTE — Op Note (Signed)
12/17/2018 [facility]                              OPERATIVE REPORT   PREOPERATIVE DIAGNOSIS:  Left carpal tunnel syndrome.  POSTOPERATIVE DIAGNOSIS:  Left carpal tunnel syndrome.  PROCEDURE:  Left carpal tunnel release.  SURGEON:  Leanora Cover, MD  ASSISTANT:  none.  ANESTHESIA: Bier block with sedation  IV FLUIDS:  Per anesthesia flow sheet.  ESTIMATED BLOOD LOSS:  Minimal.  COMPLICATIONS:  None.  SPECIMENS:  None.  TOURNIQUET TIME:    Total Tourniquet Time Documented: Forearm (Left) - 23 minutes Total: Forearm (Left) - 23 minutes   DISPOSITION:  Stable to PACU.  LOCATION: [facility]  INDICATIONS:  73 yo female with numbness and tingling left hand.  Positive nerve conduction studies.  She wishes to have a carpal tunnel release for management of her symptoms.  Risks, benefits and alternatives of surgery were discussed including the risk of blood loss; infection; damage to nerves, vessels, tendons, ligaments, bone; failure of surgery; need for additional surgery; complications with wound healing; continued pain; recurrence of carpal tunnel syndrome; and damage to motor branch. She voiced understanding of these risks and elected to proceed.   OPERATIVE COURSE:  After being identified preoperatively by myself, the patient and I agreed upon the procedure and site of procedure.  The surgical site was marked.  The risks, benefits, and alternatives of the surgery were reviewed and she wished to proceed.  Surgical consent had been signed.  She was given IV vancomycin as preoperative antibiotic prophylaxis.  She was transferred to the operating room and placed on the operating room table in supine position with the Left upper extremity on an armboard.  Bier block anesthesia was induced by the anesthesiologist.  Left upper extremity was prepped and draped in normal sterile orthopaedic fashion.  A surgical pause was performed between the surgeons, anesthesia,  and operating room staff, and all were in agreement as to the patient, procedure, and site of procedure.  Tourniquet at the proximal aspect of the forearm had been inflated for the Bier block  Incision was made over the transverse carpal ligament and carried into the subcutaneous tissues by spreading technique.  Bipolar electrocautery was used to obtain hemostasis.  The palmar fascia was sharply incised.  The transverse carpal ligament was identified and sharply incised.  It was incised distally first.  Care was taken to ensure complete decompression distally.  It was then incised proximally.  Scissors were used to split the distal aspect of the volar antebrachial fascia.  A finger was placed into the wound to ensure complete decompression, which was the case.  The nerve was examined.  It was flattened and hyperemic. and It was adherent to the radial leaflet.  The motor branch was identified and was intact.  The wound was copiously irrigated with sterile saline.  It was then closed with 4-0 nylon in a horizontal mattress fashion.  It was injected with 0.25% plain Marcaine to aid in postoperative analgesia.  It was dressed with sterile Xeroform, 4x4s, an ABD, and wrapped with Kerlix and an Ace bandage.  Tourniquet was deflated at 23 minutes.  Fingertips were pink with brisk capillary refill after deflation of the tourniquet.  Operative drapes were broken down.  The patient was awoken from anesthesia safely.  She was transferred back to stretcher and taken to the PACU in stable condition.  I will  see her back in the office in 1 week for postoperative followup.  I will give her a prescription for Norco 5/325 1-2 tabs PO q6 hours prn pain, dispense # 20.    Leanora Cover, MD Electronically signed, 12/17/18

## 2018-12-17 NOTE — Discharge Instructions (Addendum)

## 2018-12-17 NOTE — Anesthesia Postprocedure Evaluation (Signed)
Anesthesia Post Note  Patient: [name]  Procedure(s) Performed: LEFT CARPAL TUNNEL RELEASE (Left Hand)     Patient location during evaluation: PACU Anesthesia Type: MAC and Bier Block Level of consciousness: awake and alert Pain management: pain level controlled Vital Signs Assessment: post-procedure vital signs reviewed and stable Respiratory status: spontaneous breathing, nonlabored ventilation, respiratory function stable and patient connected to nasal cannula oxygen Cardiovascular status: stable and blood pressure returned to baseline Postop Assessment: no apparent nausea or vomiting Anesthetic complications: no    Last Vitals:  Vitals:   12/17/18 1418 12/17/18 1450  BP:  131/79  Pulse: 79 74  Resp: 16 18  Temp:  36.6 C  SpO2: 97% 97%    Last Pain:  Vitals:   12/17/18 1450  TempSrc:   PainSc: 0-No pain                 Tiajuana Amass

## 2018-12-17 NOTE — Transfer of Care (Signed)
Immediate Anesthesia Transfer of Care Note  Patient: [name]  Procedure(s) Performed: LEFT CARPAL TUNNEL RELEASE (Left Hand)  Patient Location: PACU  Anesthesia Type:MAC and Bier block  Level of Consciousness: awake, alert  and oriented  Airway & Oxygen Therapy: Patient Spontanous Breathing and Patient connected to face mask oxygen  Post-op Assessment: Report given to RN and Post -op Vital signs reviewed and stable  Post vital signs: Reviewed and stable  Last Vitals:  Vitals Value Taken Time  BP    Temp    Pulse 88 12/17/18 1353  Resp    SpO2 100 % 12/17/18 1353  Vitals shown include unvalidated device data.  Last Pain:  Vitals:   12/17/18 1154  TempSrc: Oral  PainSc: 6          Complications: No apparent anesthesia complications

## 2018-12-17 NOTE — H&P (Signed)
[name] is an 73 y.o. female.   Chief Complaint: left carpal tunnel syndrome HPI: 73 yo female with numbness and tingling left hand.  Positive nerve conduction studies.  She wishes to have a left carpal tunnel release.  Allergies:  Allergies  Allergen Reactions  . Betadine [Povidone Iodine] Rash and Other (See Comments)    blisters  . Penicillins Anaphylaxis  . Levofloxacin Other (See Comments)    Muscle weakness, couldn't move well    Past Medical History:  Diagnosis Date  . Allergic bronchitis   . Allergic bronchitis   . Allergy   . Arthritis   . Claustrophobia   . Dependent edema   . GERD (gastroesophageal reflux disease)    occas problem - burning, cough would take prilosec if needed  . H/O hiatal hernia   . HSV-2 (herpes simplex virus 2) infection   . Hypothyroidism   . Peripheral neuropathy    3 toes right foot 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 problem  . Thyroid disease     Past Surgical History:  Procedure Laterality Date  . BACK SURGERY    . BREAST BIOPSY  1972   benign  . CHOLECYSTECTOMY    . LUMBAR LAMINECTOMY  1999   L4  . LUMBAR LAMINECTOMY  1981   L5  . TOTAL HIP ARTHROPLASTY Right 07/02/2012   Procedure: RIGHT TOTAL HIP ARTHROPLASTY ANTERIOR APPROACH;  Surgeon: Mcarthur Rossetti, MD;  Location: [facility];  Service: Orthopedics;  Laterality: Right;    Family History: Family History  Problem Relation Age of Onset  . Heart disease Father   . Hypertension Sister   . Heart disease Brother   . Asthma Daughter   . Hypertension Sister     Social History:   reports that she has never smoked. She has never used smokeless tobacco. She reports current alcohol use. She reports that she does not use drugs.  Medications: Medications Prior to Admission  Medication Sig Dispense Refill  . buPROPion (WELLBUTRIN XL) 150 MG 24 hr tablet TAKE 1 TABLET BY MOUTH EVERY DAY 90 tablet 3  . glucosamine-chondroitin 500-400 MG tablet Take 1 tablet by mouth 2  (two) times daily.    . Multiple Vitamin (MULTIVITAMIN) tablet Take 1 tablet by mouth daily.     . Multiple Vitamins-Minerals (PRESERVISION AREDS 2 PO) Take by mouth.    Marland Kitchen OVER THE COUNTER MEDICATION Vitamin D 3  One daily    . potassium chloride SA (K-DUR) 20 MEQ tablet TAKE 1 TABLET BY MOUTH EVERY DAY 90 tablet 0  . pyridOXINE (VITAMIN B-6) 100 MG tablet Take 100 mg by mouth 2 times daily at 12 Mason and 4 pm.    . SYNTHROID 112 MCG tablet TAKE 1 TABLET BY MOUTH EVERY DAY ON AN EMPTY STOMACH WITH NO OTHER MEDS OR FOOD 90 tablet 0  . torsemide (DEMADEX) 20 MG tablet Take 1 tablet (20 mg total) by mouth 2 (two) times daily. (Patient taking differently: Take 40 mg by mouth 2 (two) times daily. ) 180 tablet 1  . valACYclovir (VALTREX) 500 MG tablet Take 1 tablet (500 mg total) by mouth daily. 90 tablet 3  . ALPRAZolam (XANAX) 0.5 MG tablet TAKE ONE TABLET DAILY AT BEDTIME AS NEEDED FOR SLEEP 90 tablet 5    No results found for this or any previous visit (from the past 48 hour(s)).  No results found.   A comprehensive review of systems was negative.  Blood pressure 130/65, pulse 87, temperature 98.6  F (37 C), temperature source Oral, height 5' 4.5" (1.638 m), weight 70.4 kg, SpO2 100 %.  General appearance: alert, cooperative and appears stated age Head: Normocephalic, without obvious abnormality, atraumatic Neck: supple, symmetrical, trachea midline Cardio: regular rate and rhythm Resp: clear to auscultation bilaterally Extremities: Intact sensation and capillary refill all digits.  +epl/fpl/io.  No wounds.  Pulses: 2+ and symmetric Skin: Skin color, texture, turgor normal. No rashes or lesions Neurologic: Grossly normal Incision/Wound: none  Assessment/Plan Left carpal tunnel syndrome.  Non operative and operative treatment options have been discussed with the patient and patient wishes to proceed with operative treatment. Risks, benefits, and alternatives of surgery have been  discussed and the patient agrees with the plan of care.   Leanora Cover 12/17/2018, 1:08 PM

## 2018-12-17 NOTE — Anesthesia Preprocedure Evaluation (Addendum)
Anesthesia Evaluation  Patient identified by MRN, date of birth, ID band Patient awake    Reviewed: Allergy & Precautions, NPO status , Patient's Chart, lab work & pertinent test results  Airway Mallampati: II  TM Distance: >3 FB     Dental  (+) Dental Advisory Given   Pulmonary neg pulmonary ROS,    breath sounds clear to auscultation       Cardiovascular negative cardio ROS   Rhythm:Regular Rate:Normal     Neuro/Psych  Neuromuscular disease    GI/Hepatic Neg liver ROS, hiatal hernia, GERD  ,  Endo/Other  Hypothyroidism   Renal/GU negative Renal ROS     Musculoskeletal  (+) Arthritis ,   Abdominal   Peds  Hematology negative hematology ROS (+)   Anesthesia Other Findings   Reproductive/Obstetrics                             Anesthesia Physical Anesthesia Plan  ASA: II  Anesthesia Plan: Bier Block and MAC and Bier Block-LIDOCAINE ONLY   Post-op Pain Management:  Regional for Post-op pain   Induction:   PONV Risk Score and Plan: 2 and Propofol infusion, Ondansetron and Treatment may vary due to age or medical condition  Airway Management Planned: Natural Airway and Simple Face Mask  Additional Equipment:   Intra-op Plan:   Post-operative Plan:   Informed Consent: I have reviewed the patients History and Physical, chart, labs and discussed the procedure including the risks, benefits and alternatives for the proposed anesthesia with the patient or authorized representative who has indicated his/her understanding and acceptance.       Plan Discussed with: CRNA  Anesthesia Plan Comments:         Anesthesia Quick Evaluation

## 2018-12-20 ENCOUNTER — Encounter (HOSPITAL_BASED_OUTPATIENT_CLINIC_OR_DEPARTMENT_OTHER): Payer: Self-pay | Admitting: Orthopedic Surgery

## 2018-12-21 ENCOUNTER — Encounter

## 2018-12-21 ENCOUNTER — Encounter: Payer: Medicare Other | Admitting: Neurology

## 2018-12-28 ENCOUNTER — Encounter: Payer: Medicare Other | Admitting: Neurology

## 2019-01-03 ENCOUNTER — Other Ambulatory Visit: Payer: Self-pay

## 2019-01-03 ENCOUNTER — Other Ambulatory Visit: Payer: Medicare Other | Admitting: Internal Medicine

## 2019-01-03 DIAGNOSIS — M1611 Unilateral primary osteoarthritis, right hip: Secondary | ICD-10-CM

## 2019-01-03 DIAGNOSIS — E039 Hypothyroidism, unspecified: Secondary | ICD-10-CM | POA: Diagnosis not present

## 2019-01-03 DIAGNOSIS — H903 Sensorineural hearing loss, bilateral: Secondary | ICD-10-CM | POA: Diagnosis not present

## 2019-01-03 DIAGNOSIS — Z8659 Personal history of other mental and behavioral disorders: Secondary | ICD-10-CM | POA: Diagnosis not present

## 2019-01-03 DIAGNOSIS — K219 Gastro-esophageal reflux disease without esophagitis: Secondary | ICD-10-CM

## 2019-01-03 DIAGNOSIS — F439 Reaction to severe stress, unspecified: Secondary | ICD-10-CM | POA: Diagnosis not present

## 2019-01-03 DIAGNOSIS — B009 Herpesviral infection, unspecified: Secondary | ICD-10-CM | POA: Diagnosis not present

## 2019-01-03 DIAGNOSIS — Z0000 Encounter for general adult medical examination without abnormal findings: Secondary | ICD-10-CM | POA: Diagnosis not present

## 2019-01-03 LAB — COMPLETE METABOLIC PANEL WITH GFR
AG Ratio: 1.7 (calc) (ref 1.0–2.5)
ALT: 17 U/L (ref 6–29)
AST: 21 U/L (ref 10–35)
Albumin: 4 g/dL (ref 3.6–5.1)
Alkaline phosphatase (APISO): 95 U/L (ref 37–153)
BUN: 14 mg/dL (ref 7–25)
CO2: 31 mmol/L (ref 20–32)
Calcium: 9.9 mg/dL (ref 8.6–10.4)
Chloride: 98 mmol/L (ref 98–110)
Creat: 0.67 mg/dL (ref 0.60–0.93)
GFR, Est African American: 100 mL/min/{1.73_m2} (ref >=60)
GFR, Est Non African American: 86 mL/min/{1.73_m2} (ref >=60)
Globulin: 2.4 g/dL (calc) (ref 1.9–3.7)
Glucose, Bld: 88 mg/dL (ref 65–99)
Potassium: 4.8 mmol/L (ref 3.5–5.3)
Sodium: 139 mmol/L (ref 135–146)
Total Bilirubin: 0.6 mg/dL (ref 0.2–1.2)
Total Protein: 6.4 g/dL (ref 6.1–8.1)

## 2019-01-03 LAB — CBC WITH DIFFERENTIAL/PLATELET
Absolute Monocytes: 884 cells/uL (ref 200–950)
Basophils Absolute: 102 cells/uL (ref 0–200)
Basophils Relative: 1.1 %
Eosinophils Absolute: 1628 cells/uL — ABNORMAL HIGH (ref 15–500)
Eosinophils Relative: 17.5 %
HCT: 38.4 % (ref 35.0–45.0)
Hemoglobin: 12.9 g/dL (ref 11.7–15.5)
Lymphs Abs: 1237 cells/uL (ref 850–3900)
MCH: 30.1 pg (ref 27.0–33.0)
MCHC: 33.6 g/dL (ref 32.0–36.0)
MCV: 89.5 fL (ref 80.0–100.0)
MPV: 8 fL (ref 7.5–12.5)
Monocytes Relative: 9.5 %
Neutro Abs: 5450 cells/uL (ref 1500–7800)
Neutrophils Relative %: 58.6 %
Platelets: 456 10*3/uL — ABNORMAL HIGH (ref 140–400)
RBC: 4.29 10*6/uL (ref 3.80–5.10)
RDW: 11.9 % (ref 11.0–15.0)
Total Lymphocyte: 13.3 %
WBC: 9.3 10*3/uL (ref 3.8–10.8)

## 2019-01-03 LAB — LIPID PANEL
Cholesterol: 184 mg/dL (ref <200)
HDL: 67 mg/dL (ref >=50)
LDL Cholesterol (Calc): 96 mg/dL (calc)
Non-HDL Cholesterol (Calc): 117 mg/dL (calc) (ref <130)
Total CHOL/HDL Ratio: 2.7 (calc) (ref <5.0)
Triglycerides: 116 mg/dL (ref <150)

## 2019-01-03 LAB — TSH: TSH: 5.95 mIU/L — ABNORMAL HIGH (ref 0.40–4.50)

## 2019-01-07 ENCOUNTER — Other Ambulatory Visit: Payer: Self-pay

## 2019-01-07 ENCOUNTER — Ambulatory Visit (INDEPENDENT_AMBULATORY_CARE_PROVIDER_SITE_OTHER): Payer: Medicare Other | Admitting: Internal Medicine

## 2019-01-07 ENCOUNTER — Encounter: Payer: Self-pay | Admitting: Internal Medicine

## 2019-01-07 VITALS — BP 140/90 | HR 95 | Temp 98.0°F | Ht 63.0 in | Wt 153.0 lb

## 2019-01-07 DIAGNOSIS — R609 Edema, unspecified: Secondary | ICD-10-CM

## 2019-01-07 DIAGNOSIS — F329 Major depressive disorder, single episode, unspecified: Secondary | ICD-10-CM

## 2019-01-07 DIAGNOSIS — Z8619 Personal history of other infectious and parasitic diseases: Secondary | ICD-10-CM | POA: Diagnosis not present

## 2019-01-07 DIAGNOSIS — E039 Hypothyroidism, unspecified: Secondary | ICD-10-CM | POA: Diagnosis not present

## 2019-01-07 DIAGNOSIS — Z23 Encounter for immunization: Secondary | ICD-10-CM

## 2019-01-07 DIAGNOSIS — G4700 Insomnia, unspecified: Secondary | ICD-10-CM

## 2019-01-07 DIAGNOSIS — Z96641 Presence of right artificial hip joint: Secondary | ICD-10-CM

## 2019-01-07 DIAGNOSIS — G2581 Restless legs syndrome: Secondary | ICD-10-CM

## 2019-01-07 DIAGNOSIS — D7219 Other eosinophilia: Secondary | ICD-10-CM | POA: Diagnosis not present

## 2019-01-07 DIAGNOSIS — F419 Anxiety disorder, unspecified: Secondary | ICD-10-CM | POA: Diagnosis not present

## 2019-01-07 DIAGNOSIS — R7989 Other specified abnormal findings of blood chemistry: Secondary | ICD-10-CM | POA: Diagnosis not present

## 2019-01-07 DIAGNOSIS — Z0000 Encounter for general adult medical examination without abnormal findings: Secondary | ICD-10-CM

## 2019-01-07 DIAGNOSIS — M19012 Primary osteoarthritis, left shoulder: Secondary | ICD-10-CM

## 2019-01-07 DIAGNOSIS — I878 Other specified disorders of veins: Secondary | ICD-10-CM | POA: Diagnosis not present

## 2019-01-07 LAB — POCT URINALYSIS DIPSTICK
Appearance: NEGATIVE
Bilirubin, UA: NEGATIVE
Blood, UA: NEGATIVE
Glucose, UA: NEGATIVE
Ketones, UA: NEGATIVE
Leukocytes, UA: NEGATIVE
Nitrite, UA: NEGATIVE
Odor: NEGATIVE
Protein, UA: NEGATIVE
Spec Grav, UA: 1.015 (ref 1.010–1.025)
Urobilinogen, UA: 0.2 E.U./dL
pH, UA: 6.5 (ref 5.0–8.0)

## 2019-01-08 ENCOUNTER — Other Ambulatory Visit: Payer: Self-pay | Admitting: Internal Medicine

## 2019-01-08 MED ORDER — LEVOTHYROXINE SODIUM 125 MCG PO TABS: 125.0000 ug | ORAL_TABLET | Freq: Every day | ORAL | 3 refills | Status: DC

## 2019-01-08 MED ORDER — ALPRAZOLAM 0.5 MG PO TABS: ORAL_TABLET | ORAL | 0 refills | Status: DC

## 2019-01-08 NOTE — Progress Notes (Addendum)
Subjective:    Patient ID: [name], female    DOB: 12/14/1945, 73 y.o.   MRN: [id number]  HPI 73 year old Female in today for Medicare wellness exam, health maintenance exam and evaluation of medical issues including ongoing lower extremity edema despite taking diuretics.  She saw cardiologist who did not think she had congestive heart failure.  She developed numbness and tingling in her left hand.  She saw hand surgeon and nerve conduction studies showed findings consistent with bilateral carpal tunnel syndrome.  She underwent left carpal tunnel release by Dr. Fredna Dow here in [facility] October 2.  Numbness and tingling have improved but right hand is bothering her.  She has seen Dr. Ninfa Linden this past July for chronic left shoulder pain and chronic right shoulder pain treated with steroid injections.  Interestingly she thinks she responded well to the steroid injections with less musculoskeletal pain in general and less edema.  She also has developed restless leg syndrome which has been treated with Requip.  Issues with dependent edema date back to March 2020.  At that time the gym shutdown and she was not exercising regularly.  She was treated with Lasix for dependent edema but that did not help very much.  Subsequently was switched to [facility].  She has a history of hypothyroidism and from time to time her thyroid replacement medication needs to be adjusted.  In March TSH was normal at 1.80 and in May it was normal at 2.3.  However NG TSH was elevated at 5.56 and she denied missing any doses.  Just recently it was rechecked and is 5.95.  She is on nongeneric Synthroid since June.  Most recently has been on 0.112 mg daily and will be increased to 0.125 mg daily.  Will need follow-up on TSH in approximately 6 weeks.  She saw Dr. Andree Elk, Cardiologist, in July who did a careful evaluation regarding lower extremity edema.  Compression stockings were recommended by vascular surgeon.   It is not thought that she has peripheral vascular disease.  Patient has noticed that the edema gets better with steroids.  BNP was normal.  The fact that she gets better with steroids sounds like some form of inflammation but sed rate was 11.  Iron and iron binding capacity were checked because of complaints of restless leg syndrome and were normal.  Recent physical exam labs were drawn October 19.  White blood cell count was 9300 with hemoglobin 12.9 g.  MCV was normal.  Platelet count was 156,000 and previously had been 282,000 in October 2019.  She saw vascular surgery PA in September was diagnosed with chronic venous insufficiency and compression hose were ordered.  She had normal ABIs.  She had no evidence of DVT.  Has been trying compression hose but does not see any difference in the edema.  Does not like wearing the hose.  However a new finding was significant elevation in her absolute eosinophil at 1628 and previously had been always normal ranging between 260 and 282 over the past 4 years.  Therefore, after careful review of records and recent labs it seems that patient has protracted edema of both lower extremities not responding well to torsemide twice daily, bilateral carpal tunnel syndrome with eosinophilia.  Eosinophil count is quite high.  Considerations include infectious, inflammatory or neoplastic etiologies.    She is very frustrated this point and I think we need to refer her to a specialist perhaps Allergy or Hematology for further evaluation.  She remains on torsemide 20 mg twice daily along with potassium supplement.  She takes Wellbutrin and also takes Xanax.  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  I do not think any of these medications are causing the marked edema.  She says she is following a low-salt diet.  History of Herpes simplex type II, GE reflux, allergic rhinitis, anxiety and neuropathy of right foot.  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 Ninfa Linden.   She has a healthcare power of attorney and has provided [place] with those documents.  She is allergic to penicillin-causes anaphylaxis.  Left breast biopsy benign 1972, lumbar disc surgery L4 1999, lumbar disc surgery at L5 1991.  Cholecystectomy 2004.  Colonoscopy initially done 2007 with 10-year follow-up recommended.  Normal mammogram October 2019.  Social history: Her husband is a retired Control and instrumentation engineer and is a Ambulance person [employer].  Patient lost stepson to suicide in 2018.  She has 2 adult daughters from a previous marriage.  She does not smoke.  Social alcohol consumption.  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  Family history: 1 brother died at age 40 of congestive heart failure secondary to rheumatic finger complications.  1 sister with hypertension and obesity.  Another sister with hypertension and obesity.  1 brother living.  Mother deceased with history of neurological problems and heart disease.      Review of Systems no weight loss or night sweats.  No itching.  Arthralgias are present.     Objective:   Physical Exam Blood pressure 140/90 pulse 95 temperature 98 degrees , pulse oximetry 97% BMI 27.10 weight 153 pounds  Skin warm and dry.  Nodes none.  Neck is supple without thyromegaly or carotid bruits.  TMs are clear.  PERRLA extraocular movements are full.  Chest is clear to auscultation.  Cardiac exam regular rate and rhythm normal S1 and S2.  Abdomen no hepatosplenomegaly masses or tenderness.  GYN and is deferred to GYN conization was recently done and was described by patient is normal.  She has trace to 1+ pitting edema of the lower extremities.  She has a surgical scar from left carpal tunnel release       Assessment & Plan:  Peripheral edema since around March  Marked eosinophilia rule out neoplastic or inflammatory disease.  Referral to [facility] for evaluation  Status post left carpal tunnel  release  Right carpal tunnel syndrome  Hypothyroidism-TSH elevated and will increase dose of Synthroid with follow-up in 6 weeks  Restless leg syndrome with no diagnosis of iron deficiency which started with the edema  Extensive cardiology work-up and vascular surgery work-up shows no evidence of peripheral vascular disease or congestive heart failure  History of anxiety and depression treated with Xanax and Wellbutrin  Plan: Referral to be made.  Increase Synthroid to 0.125 mg daily and follow-up in 6 weeks here.  Subjective:   Patient presents for Medicare Annual/Subsequent preventive examination.  Review Past Medical/Family/Social: See above   Risk Factors  Current exercise habits: Tries to walk some and does some exercises outdoors Dietary issues discussed: Low salt, low-fat low carbohydrate  Cardiac risk factors: Family history  Depression Screen  (Note: if answer to either of the following is "Yes", a more complete depression screening is indicated)   Over the past two weeks, have you felt down, depressed or hopeless? No  Over the past two weeks, have you felt  little interest or pleasure in doing things? No Have you lost interest or pleasure in daily life? No Do you often feel hopeless? No Do you cry easily over simple problems? No   Activities of Daily Living  In your present state of health, do you have any difficulty performing the following activities?:   Driving? No  Managing money? No  Feeding yourself? No  Getting from bed to chair? No  Climbing a flight of stairs? No  Preparing food and eating?: No  Bathing or showering? No  Getting dressed: No  Getting to the toilet? No  Using the toilet:No  Moving around from place to place: No  In the past year have you fallen or had a near fall?:No  Are you sexually active? yes Do you have more than one partner? No   Hearing Difficulties:  Do you often ask people to speak up or repeat themselves?  Sometimes Do  you experience ringing or noises in your ears? No  Do you have difficulty understanding soft or whispered voices?  Sometimes Do you feel that you have a problem with memory? No Do you often misplace items? No    Home Safety:  Do you have a smoke alarm at your residence? Yes Do you have grab bars in the bathroom?  Yes Do you have throw rugs in your house?  Yes   Cognitive Testing  Alert? Yes Normal Appearance?Yes  Oriented to person? Yes Place? Yes  Time? Yes  Recall of three objects? Yes  Can perform simple calculations? Yes  Displays appropriate judgment?Yes  Can read the correct time from a watch face?Yes   List the Names of Other Physician/Practitioners you currently use:  See referral list for the physicians patient is currently seeing.  Dr. Shelton Silvas Christopher-cardiologist  Dr. Ruta Hinds- vascular surgeon  Dr. Malachi Carl  Dr. Fredna Dow- hand surgeon   Review of Systems: See above   Objective:     General appearance: Appears younger than stated age Head: Normocephalic, without obvious abnormality, atraumatic  Eyes: conj clear, EOMi PEERLA  Ears: normal TM's and external ear canals both ears  Nose: Nares normal. Septum midline.  Neck: no adenopathy, no carotid bruit, no JVD, supple, symmetrical, trachea midline and thyroid not enlarged, symmetric, no tenderness/mass/nodules  No CVA tenderness.  Lungs: clear to auscultation bilaterally  Breasts: normal appearance, no masses or tenderness Heart: regular rate and rhythm, S1, S2 normal, no murmur, click, rub or gallop  Abdomen: soft, non-tender; bowel sounds normal; no masses, no organomegaly  Musculoskeletal: ROM normal in all joints, no crepitus, no deformity, Normal muscle strengthen. Back  is symmetric, no curvature. Skin: Skin color, texture, turgor normal. No rashes or lesions  Lymph nodes: Cervical, supraclavicular, and axillary nodes normal.  Neurologic: CN 2 -12 Normal, Normal symmetric  reflexes. Normal coordination and gait  Psych: Alert & Oriented x 3, Mood appear stable.  Trace to 1+ pitting edema of the lower extremities  Assessment:    Annual wellness medicare exam   Plan:    During the course of the visit the patient was educated and counseled about appropriate screening and preventive services including:   Annual flu vaccine given today  Pneumococcal vaccines are up-to-date     Patient Instructions (the written plan) was given to the patient.  Medicare Attestation  I have personally reviewed:  The patient's medical and social history  Their use of alcohol, tobacco or illicit drugs  Their current medications and supplements  The patient's functional ability including  ADLs,fall risks, home safety risks, cognitive, and hearing and visual impairment  Diet and physical activities  Evidence for depression or mood disorders  The patient's weight, height, BMI, and visual acuity have been recorded in the chart. I have made referrals, counseling, and provided education to the patient based on review of the above and I have provided the patient with a written personalized care plan for preventive services.

## 2019-01-08 NOTE — Patient Instructions (Addendum)
Increased dose of thyroid replacement medication due to elevated TSH and follow-up in 6 weeks.  Referral to [facility] for evaluation of significant eosinophilia.  Flu vaccine given today.

## 2019-01-10 ENCOUNTER — Telehealth: Payer: Self-pay | Admitting: Internal Medicine

## 2019-01-10 NOTE — Telephone Encounter (Signed)
Faxed referral, demographics, office notes, labs and medication list to [facility] / [facility] [address], [place] [address] - Phone [phone] ext [phone], fax number [phone]

## 2019-01-12 ENCOUNTER — Ambulatory Visit (INDEPENDENT_AMBULATORY_CARE_PROVIDER_SITE_OTHER): Payer: Medicare Other | Admitting: Internal Medicine

## 2019-01-12 ENCOUNTER — Other Ambulatory Visit: Payer: Self-pay

## 2019-01-12 ENCOUNTER — Encounter: Payer: Self-pay | Admitting: Internal Medicine

## 2019-01-12 VITALS — BP 105/51 | HR 79 | Temp 97.9°F | Ht 63.0 in | Wt 153.0 lb

## 2019-01-12 DIAGNOSIS — R7989 Other specified abnormal findings of blood chemistry: Secondary | ICD-10-CM

## 2019-01-12 DIAGNOSIS — R609 Edema, unspecified: Secondary | ICD-10-CM

## 2019-01-12 DIAGNOSIS — D7219 Other eosinophilia: Secondary | ICD-10-CM | POA: Diagnosis not present

## 2019-01-12 DIAGNOSIS — E039 Hypothyroidism, unspecified: Secondary | ICD-10-CM | POA: Diagnosis not present

## 2019-01-12 MED ORDER — PREDNISONE 10 MG PO TABS: ORAL_TABLET | ORAL | 0 refills | Status: DC

## 2019-01-12 NOTE — Progress Notes (Signed)
   Subjective:    Patient ID: [name], female    DOB: 12-26-1945, 73 y.o.   MRN: [id number]  HPI 73 year old Female called to request prednisone Dosepak form arthralgias.  Edema continues.  She was seen for Medicare wellness and health maintenance exam October 23.  Was found to have significant eosinophilia.  She indicated at that time that swelling and arthralgias previously responded to prednisone.  She says her legs continue to be swollen despite wearing compression stockings.  A referral was made to [facility] and she received a call today.  Apparently there is no opening in Hematology until February.  It seems that she has either an inflammatory condition or neoplastic condition based on her eosinophilia and symptoms.  She is agreeable to referral to Hematology here.  Due to the coronavirus pandemic, she is seen today via interactive audio and video telecommunications.  She is identified as [name]. Daniello, a patient in this practice, using 2 identifiers.  She is agreeable to visit in this format.  She currently is taking torsemide 20 mg twice daily.  She is taking a potassium supplement.  She has a history of hypothyroidism and we recently adjusted her thyroid replacement medication but she has not experienced this type of edema before with hypothyroidism and elevated TSH.  She is on Wellbutrin.    Review of Systems pitting edema of the lower extremities, right carpal tunnel syndrome due to fluid retention.  Status post left carpal tunnel release recently     Objective:   Physical Exam  Reports that she is afebrile.  At her last visit in person October 23, her weight was up 7 pounds from visit November 2019.      Assessment & Plan:  Fluid retention/lower extremity edema etiology unclear despite torsemide 20 mg twice daily.  No shortness of breath to suggest congestive heart failure.  However has significant eosinophilia  ?  Inflammatory versus neoplastic etiology.   Patient will be referred to Hematology for evaluation.  At her request: Prednisone Dosepak 10 mg number 21 tablets going from 60 mg to 0 mg over 7 days.  She says this is helped significantly in the remote past

## 2019-01-12 NOTE — Patient Instructions (Addendum)
Referral to hematology regarding eosinophilia and fluid retention.  Trial of prednisone in tapering course.

## 2019-01-14 ENCOUNTER — Encounter: Payer: Self-pay | Admitting: Cardiology

## 2019-01-14 ENCOUNTER — Other Ambulatory Visit: Payer: Self-pay

## 2019-01-14 ENCOUNTER — Ambulatory Visit (INDEPENDENT_AMBULATORY_CARE_PROVIDER_SITE_OTHER): Payer: Medicare Other | Admitting: Cardiology

## 2019-01-14 VITALS — BP 126/60 | HR 73 | Temp 97.5°F | Ht 63.5 in | Wt 153.0 lb

## 2019-01-14 DIAGNOSIS — D7219 Other eosinophilia: Secondary | ICD-10-CM

## 2019-01-14 DIAGNOSIS — R600 Localized edema: Secondary | ICD-10-CM

## 2019-01-14 DIAGNOSIS — I872 Venous insufficiency (chronic) (peripheral): Secondary | ICD-10-CM

## 2019-01-14 DIAGNOSIS — Z7189 Other specified counseling: Secondary | ICD-10-CM | POA: Diagnosis not present

## 2019-01-14 NOTE — Patient Instructions (Signed)
Medication Instructions:  Your Physician recommend you continue on your current medication as directed.    *If you need a refill on your cardiac medications before your next appointment, please call your pharmacy*  Lab Work: None  Testing/Procedures: None  Follow-Up: At [facility], you and your health needs are our priority.  As part of our continuing mission to provide you with exceptional heart care, we have created designated Provider Care Teams.  These Care Teams include your primary Cardiologist (physician) and Advanced Practice Providers (APPs -  Physician Assistants and Nurse Practitioners) who all work together to provide you with the care you need, when you need it.  Your next appointment:   12 months  The format for your next appointment:   In Person  Provider:   Bridgette Christopher, MD    

## 2019-01-14 NOTE — Progress Notes (Signed)
Cardiology Office Note:    Date:  01/14/2019   ID:  [name], DOB 1945-05-10, MRN [id number]  PCP:  Elby Showers, MD  Cardiologist:  Buford Dresser, MD PhD  Referring MD: Elby Showers, MD   CC: follow up  History of Present Illness:    [name] is a 73 y.o. female with a hx of hypothyroidism, restless leg, arthritis who is seen in follow up today. She was initially seen 10/15/18 as a new consult at the request of Baxley, Cresenciano Lick, MD for the evaluation and management of edema.  Edema history:  She reports that lower extremity edema came on very suddenly in the middle of March 2020. Had URI symptoms at the time but did not get tested for Covid. Went on steroids for her shoulders and all the swelling went away for those 6 days. Felt like a new person. Swelling returned when she stopped the steroids, but again swelling decreased after she had joint injections in her shoulders. She tried lasix initially, which did not help. Tried torsemide, first 20 mg then 40 mg. No change with 20 mg, but lost about 6 lbs on 40 mg torsemide. Usual weight 140-144 lbs.   Workup thus far: DVT scan negative, BNP 30, TSH 5.56, Cr/Na normal. K low, treated with supplementation. ESR normal 07/2018. Arterial LE ultrasounds unremarkable, but venous LE ultrasounds with significant venous insufficiency. Echo with EF 60-65%, grade 1 DD, no significant valve disease, couldn't assess R sided pressures.   Today:  Started prednisone, on day 3/6. Feels wonderful on this. Pain and swelling decreased significantly since starting this.   Found to have significant eosinophilia on recent blood work, prior to starting prednisone. No clear trigger for this found. See comments below re: workup.   We talked for a long time today about all of her studies, including echo, ABI, venous studies, and blood work. Spoke at length about venous insufficiency and management. Currently taking 40 mg BID torsemide, discussed that  there is danger to the kidneys of having too high of a dose long term. We discussed compression and elevation of legs, as well as cardiovascular activity, at length.  Denies chest pain, shortness of breath at rest or with normal exertion. No PND, orthopnea, or unexpected weight gain. No syncope or palpitations.   Past Medical History:  Diagnosis Date  . Allergic bronchitis   . Allergic bronchitis   . Allergy   . Arthritis   . Claustrophobia   . Dependent edema   . GERD (gastroesophageal reflux disease)    occas problem - burning, cough would take prilosec if needed  . H/O hiatal hernia   . HSV-2 (herpes simplex virus 2) infection   . Hypothyroidism   . Peripheral neuropathy    3 toes right foot 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 problem  . Thyroid disease     Past Surgical History:  Procedure Laterality Date  . BACK SURGERY    . BREAST BIOPSY  1972   benign  . CARPAL TUNNEL RELEASE Left 12/17/2018   Procedure: LEFT CARPAL TUNNEL RELEASE;  Surgeon: Leanora Cover, MD;  Location: [facility];  Service: Orthopedics;  Laterality: Left;  . CHOLECYSTECTOMY    . LUMBAR LAMINECTOMY  1999   L4  . LUMBAR LAMINECTOMY  1981   L5  . TOTAL HIP ARTHROPLASTY Right 07/02/2012   Procedure: RIGHT TOTAL HIP ARTHROPLASTY ANTERIOR APPROACH;  Surgeon: Mcarthur Rossetti, MD;  Location: [facility];  Service: Orthopedics;  Laterality: Right;    Current Medications: Current Outpatient Medications on File Prior to Visit  Medication Sig  . ALPRAZolam (XANAX) 0.5 MG tablet TAKE ONE TABLET DAILY AT BEDTIME AS NEEDED FOR SLEEP  . buPROPion (WELLBUTRIN XL) 150 MG 24 hr tablet TAKE 1 TABLET BY MOUTH EVERY DAY  . glucosamine-chondroitin 500-400 MG tablet Take 1 tablet by mouth 2 (two) times daily.  Marland Kitchen levothyroxine (SYNTHROID) 125 MCG tablet Take 1 tablet (125 mcg total) by mouth daily.  . Multiple Vitamin (MULTIVITAMIN) tablet Take 1 tablet by mouth daily.   . Multiple Vitamins-Minerals  (PRESERVISION AREDS 2 PO) Take by mouth.  Marland Kitchen OVER THE COUNTER MEDICATION Vitamin D 3  One daily  . potassium chloride SA (K-DUR) 20 MEQ tablet TAKE 1 TABLET BY MOUTH EVERY DAY  . predniSONE (DELTASONE) 10 MG tablet TAKE IN TAPERING COURSE 6,5,4,3,2,1  . pyridOXINE (VITAMIN B-6) 100 MG tablet Take 100 mg by mouth 2 times daily at 12 noon and 4 pm.  . torsemide (DEMADEX) 20 MG tablet Take 1 tablet (20 mg total) by mouth 2 (two) times daily. (Patient taking differently: Take 40 mg by mouth 2 (two) times daily. )  . valACYclovir (VALTREX) 500 MG tablet Take 1 tablet (500 mg total) by mouth daily.   No current facility-administered medications on file prior to visit.      Allergies:   Betadine [povidone iodine], Penicillins, and Levofloxacin   Social History   Socioeconomic History  . Marital status: Married    Spouse name: Not on file  . Number of children: Not on file  . Years of education: Not on file  . Highest education level: Not on file  Occupational History  . Not on file  Social Needs  . Financial resource strain: Not on file  . Food insecurity    Worry: Not on file    Inability: Not on file  . Transportation needs    Medical: Not on file    Non-medical: Not on file  Tobacco Use  . Smoking status: Never Smoker  . Smokeless tobacco: Never Used  Substance and Sexual Activity  . Alcohol use: Yes    Comment: socially  . Drug use: No  . Sexual activity: Not on file  Lifestyle  . Physical activity    Days per week: Not on file    Minutes per session: Not on file  . Stress: Not on file  Relationships  . Social Herbalist on phone: Not on file    Gets together: Not on file    Attends religious service: Not on file    Active member of club or organization: Not on file    Attends meetings of clubs or organizations: Not on file    Relationship status: Not on file  Other Topics Concern  . Not on file  Social History Narrative  . Not on file     Family  History: The patient's family history includes Asthma in her daughter; Heart disease in her brother and father; Hypertension in her sister and sister.  ROS:   Please see the history of present illness.  Additional pertinent ROS: Constitutional: Negative for chills, fever, night sweats, unintentional weight loss  HENT: Negative for ear pain and hearing loss.   Eyes: Negative for loss of vision and eye pain.  Respiratory: Negative for cough, sputum, wheezing.   Cardiovascular: See HPI. Gastrointestinal: Negative for abdominal pain, melena, and hematochezia.  Genitourinary: Negative for dysuria and hematuria.  Musculoskeletal:  Negative for falls and positive for myalgias.  Skin: Negative for itching and rash.  Neurological: Negative for focal weakness, focal sensory changes and loss of consciousness.  Endo/Heme/Allergies: Does not bruise/bleed easily.    EKGs/Labs/Other Studies Reviewed:    The following studies were reviewed today: See HPI  EKG:  EKG is personally reviewed.  The ekg ordered 10/15/18 demonstrates normal sinus rhythm  Recent Labs: 08/23/2018: Brain Natriuretic Peptide 30 01/03/2019: ALT 17; BUN 14; Creat 0.67; Hemoglobin 12.9; Platelets 456; Potassium 4.8; Sodium 139; TSH 5.95  Recent Lipid Panel    Component Value Date/Time   CHOL 184 01/03/2019 1016   TRIG 116 01/03/2019 1016   HDL 67 01/03/2019 1016   CHOLHDL 2.7 01/03/2019 1016   VLDL 13 10/02/2015 1103   LDLCALC 96 01/03/2019 1016    Physical Exam:    VS:  BP 126/60 (BP Location: Left Arm, Patient Position: Sitting, Cuff Size: Normal)   Pulse 73   Temp (!) 97.5 F (36.4 C)   Ht 5' 3.5" (1.613 m)   Wt 153 lb (69.4 kg)   BMI 26.68 kg/m     Wt Readings from Last 3 Encounters:  01/14/19 153 lb (69.4 kg)  01/12/19 153 lb (69.4 kg)  01/07/19 153 lb (69.4 kg)   GEN: Well nourished, well developed in no acute distress HEENT: Normal, moist mucous membranes NECK: No JVD CARDIAC: regular rhythm, normal S1  and S2, no rubs or gallops. No murmurs. VASCULAR: Radial and DP pulses 2+ bilaterally. No carotid bruits RESPIRATORY:  Clear to auscultation without rales, wheezing or rhonchi  ABDOMEN: Soft, non-tender, non-distended MUSCULOSKELETAL:  Ambulates independently SKIN: Warm and dry. There is blanching/pitting edema in bilateral LE, about 1+ with left slightly more than right. Brawny/firm edema. NEUROLOGIC:  Alert and oriented x 3. No focal neuro deficits noted. PSYCHIATRIC:  Normal affect   ASSESSMENT:    1. Bilateral leg edema   2. Chronic venous insufficiency   3. Cardiac risk counseling   4. Counseling on health promotion and disease prevention   5. Eosinophilic leukocytosis, unspecified type    PLAN:    Bilateral LE edema: interesting presentation/workup. Sudden onset, steroid responsiveness concerning for something like vasculitis. Only significant test to date is significant venous insufficiency. -normal inflammatory markers, until recent eosinophilia -normal BNP, echo (only grade 1 DD) do not suggest cardiac etiology -she is being treated/managed for venous insufficiency, appropriately, which we discussed at length today. Continue compression, elevation, activity, torsemide -however, there may be some rare diagnoses not excluded:   She is being considered for another carpal tunnel sydrome, just had left hand done. With bilateral carpal tunnel and edema, could consider fat pad biopsy for amyloid. However, doesn't have kidney disease, only mild diastolic dysfunction, and evidence of clear venous insufficiency, so this is lower on the dfferential. With minimal heart involvement, not sure that a PYP scan would be high yield. Could consider SPEP/UPEP evaluation to start.  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 Appears to be more temporally related to initiation than her presentation, but I encouraged her to talk to  Dr. Renold Genta about pros/cons of trialing off this (once prednisone done) to see if her eosinophilia changes.   Cardiac risk counseling and prevention recommendations: -recommend heart healthy/Mediterranean diet, with whole grains, fruits, vegetable, fish, lean meats, nuts, and olive oil. Limit salt. -recommend moderate walking, 3-5 times/week for 30-50 minutes each session. Aim for at least 150  minutes.week. Goal should be pace of 3 miles/hours, or walking 1.5 miles in 30 minutes -recommend avoidance of tobacco products. Avoid excess alcohol. -ASCVD risk score: The 10-year ASCVD risk score Mikey Bussing DC Brooke Bonito., et al., 2013) is: 15.5%   Values used to calculate the score:     Age: 1 years     Sex: Female     Is Non-Hispanic African American: No     Diabetic: No     Tobacco smoker: No     Systolic Blood Pressure: 130 mmHg     Is BP treated: No     HDL Cholesterol: 67 mg/dL     Total Cholesterol: 184 mg/dL   This is largely driven by age, and she does not currently have other risk factors.   Plan for follow up: 12 mos or sooner PRN. Testing thus far has not pointed to the heart, but I would be happy to see her sooner if concerns arise  TIME SPENT WITH PATIENT: 37 minutes of direct patient care. More than 50% of that time was spent on coordination of care and counseling regarding the above complex workup and test results to this point.  Buford Dresser, MD, PhD [place]  [facility]   Medication Adjustments/Labs and Tests Ordered: Current medicines are reviewed at length with the patient today.  Concerns regarding medicines are outlined above.  No orders of the defined types were placed in this encounter.  No orders of the defined types were placed in this encounter.   Patient Instructions  Medication Instructions:  Your Physician recommend you continue on your current medication as directed.    *If you need a refill on your cardiac medications before your next appointment,  please call your pharmacy*  Lab Work: None  Testing/Procedures: None  Follow-Up: At [facility], you and your health needs are our priority.  As part of our continuing mission to provide you with exceptional heart care, we have created designated Provider Care Teams.  These Care Teams include your primary Cardiologist (physician) and Advanced Practice Providers (APPs -  Physician Assistants and Nurse Practitioners) who all work together to provide you with the care you need, when you need it.  Your next appointment:   12 months  The format for your next appointment:   In Person  Provider:   Buford Dresser, MD       Signed, Buford Dresser, MD PhD 01/14/2019  [place]

## 2019-01-15 ENCOUNTER — Encounter: Payer: Self-pay | Admitting: Cardiology

## 2019-01-18 DIAGNOSIS — Z1231 Encounter for screening mammogram for malignant neoplasm of breast: Secondary | ICD-10-CM | POA: Diagnosis not present

## 2019-01-19 DIAGNOSIS — Z01419 Encounter for gynecological examination (general) (routine) without abnormal findings: Secondary | ICD-10-CM | POA: Diagnosis not present

## 2019-01-19 DIAGNOSIS — R601 Generalized edema: Secondary | ICD-10-CM | POA: Diagnosis not present

## 2019-01-19 DIAGNOSIS — Z780 Asymptomatic menopausal state: Secondary | ICD-10-CM | POA: Diagnosis not present

## 2019-01-19 DIAGNOSIS — N816 Rectocele: Secondary | ICD-10-CM | POA: Diagnosis not present

## 2019-01-19 DIAGNOSIS — N8112 Cystocele, lateral: Secondary | ICD-10-CM | POA: Diagnosis not present

## 2019-01-27 ENCOUNTER — Inpatient Hospital Stay: Payer: Medicare Other

## 2019-01-27 ENCOUNTER — Other Ambulatory Visit: Payer: Self-pay

## 2019-01-27 ENCOUNTER — Encounter: Payer: Self-pay | Admitting: Hematology & Oncology

## 2019-01-27 ENCOUNTER — Inpatient Hospital Stay: Payer: Medicare Other | Attending: Hematology & Oncology | Admitting: Hematology & Oncology

## 2019-01-27 VITALS — BP 143/76 | HR 72 | Temp 97.8°F | Resp 18 | Ht 63.0 in | Wt 151.0 lb

## 2019-01-27 DIAGNOSIS — Z79899 Other long term (current) drug therapy: Secondary | ICD-10-CM | POA: Diagnosis not present

## 2019-01-27 DIAGNOSIS — D721 Eosinophilia, unspecified: Secondary | ICD-10-CM | POA: Diagnosis not present

## 2019-01-27 DIAGNOSIS — D7218 Eosinophilia in diseases classified elsewhere: Secondary | ICD-10-CM

## 2019-01-27 DIAGNOSIS — M2540 Effusion, unspecified joint: Secondary | ICD-10-CM | POA: Insufficient documentation

## 2019-01-27 DIAGNOSIS — R5383 Other fatigue: Secondary | ICD-10-CM | POA: Insufficient documentation

## 2019-01-27 DIAGNOSIS — Z8249 Family history of ischemic heart disease and other diseases of the circulatory system: Secondary | ICD-10-CM | POA: Insufficient documentation

## 2019-01-27 LAB — CBC WITH DIFFERENTIAL (CANCER CENTER ONLY)
Abs Immature Granulocytes: 0.03 10*3/uL (ref 0.00–0.07)
Basophils Absolute: 0.1 10*3/uL (ref 0.0–0.1)
Basophils Relative: 1 %
Eosinophils Absolute: 0.6 10*3/uL — ABNORMAL HIGH (ref 0.0–0.5)
Eosinophils Relative: 7 %
HCT: 41.3 % (ref 36.0–46.0)
Hemoglobin: 13.3 g/dL (ref 12.0–15.0)
Immature Granulocytes: 0 %
Lymphocytes Relative: 12 %
Lymphs Abs: 1 10*3/uL (ref 0.7–4.0)
MCH: 29.6 pg (ref 26.0–34.0)
MCHC: 32.2 g/dL (ref 30.0–36.0)
MCV: 91.8 fL (ref 80.0–100.0)
Monocytes Absolute: 0.9 10*3/uL (ref 0.1–1.0)
Monocytes Relative: 10 %
Neutro Abs: 6.2 10*3/uL (ref 1.7–7.7)
Neutrophils Relative %: 70 %
Platelet Count: 367 10*3/uL (ref 150–400)
RBC: 4.5 MIL/uL (ref 3.87–5.11)
RDW: 13.1 % (ref 11.5–15.5)
WBC Count: 8.9 10*3/uL (ref 4.0–10.5)
nRBC: 0 % (ref 0.0–0.2)

## 2019-01-27 LAB — CMP (CANCER CENTER ONLY)
ALT: 13 U/L (ref 0–44)
AST: 18 U/L (ref 15–41)
Albumin: 4.4 g/dL (ref 3.5–5.0)
Alkaline Phosphatase: 82 U/L (ref 38–126)
Anion gap: 8 (ref 5–15)
BUN: 16 mg/dL (ref 8–23)
CO2: 34 mmol/L — ABNORMAL HIGH (ref 22–32)
Calcium: 9.7 mg/dL (ref 8.9–10.3)
Chloride: 97 mmol/L — ABNORMAL LOW (ref 98–111)
Creatinine: 0.69 mg/dL (ref 0.44–1.00)
GFR, Est AFR Am: >60 mL/min (ref >60)
GFR, Estimated: >60 mL/min (ref >60)
Glucose, Bld: 100 mg/dL — ABNORMAL HIGH (ref 70–99)
Potassium: 4 mmol/L (ref 3.5–5.1)
Sodium: 139 mmol/L (ref 135–145)
Total Bilirubin: 0.7 mg/dL (ref 0.3–1.2)
Total Protein: 6.6 g/dL (ref 6.5–8.1)

## 2019-01-27 LAB — LACTATE DEHYDROGENASE: LDH: 264 U/L — ABNORMAL HIGH (ref 98–192)

## 2019-01-27 LAB — SAVE SMEAR(SSMR), FOR PROVIDER SLIDE REVIEW

## 2019-01-27 LAB — VITAMIN B12: Vitamin B-12: 468 pg/mL (ref 180–914)

## 2019-01-27 NOTE — Progress Notes (Signed)
I agree with Dr. Marin Olp, the next step after he concludes his work up can be Rheumatologist of her choice.

## 2019-01-27 NOTE — Progress Notes (Signed)
Referral MD  Reason for Referral: Transient eosinophilia; fluid retention/joint swelling  Chief Complaint  Patient presents with  . New Patient (Initial Visit)  : I just do not know what is wrong.  HPI: [name] is an incredibly youthful appearing 73 year old white female.  She is originally from [place], [place].  She currently lives in [place].  She has I think to daughters.  Her husband is retired from the [employer].  She is followed by Dr. Renold Genta.  Going back to March, she began to have issues.  Things really began to cause issues for her come June.  She began to have fluid retention.  Her joints began to swell.  She has been worked up quite aggressively by Dr. Renold Genta.  Dr. Renold Genta referred [name] to orthopedic surgery.  Dr. Ninfa Linden did not think that there was an orthopedic issue.  She was then referred to cardiology.  She had echocardiogram done in August.  This showed an ejection fraction of 60-65%.  Cavity size was normal.  There was some impaired relaxation.  There was no ventricular wall thickness.  The valves looked okay.  She has had a myriad of lab work done.  She had a CBC done which did show some eosinophilia.  On 01/03/2019, a white cell count was 9.3.  Hemoglobin 12.9.  Platelet count 456,000.  She had 59% segs 13% lymphocytes.  9% monocytes and 17% eosinophils.  Because of this, Dr. Renold Genta, referred her to the [facility] for an evaluation.  [name] really is compromised by this joint issue.  She says her joints are very stiff.  She still has swelling.  She has been on diuretics.  She was placed on low-dose prednisone by Dr. Renold Genta.  This seemed to help more than anything.  [name] said that when prednisone was started she felt a lot better.  Her joints felt better.  She has had no fever.  She does not smoke.  She really does not drink.  There is no family history of any kind of blood problems.  She has had no  recent travel.  There is been no issues with respect to her mammograms and she gets routine colonoscopies as per medical surveillance.  Medical surveillance.  I would have to say that overall, her performance status is ECOG 1.   Past Medical History:  Diagnosis Date  . Allergic bronchitis   . Allergic bronchitis   . Allergy   . Arthritis   . Claustrophobia   . Dependent edema   . GERD (gastroesophageal reflux disease)    occas problem - burning, cough would take prilosec if needed  . H/O hiatal hernia   . HSV-2 (herpes simplex virus 2) infection   . Hypothyroidism   . Peripheral neuropathy    3 toes right foot 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 problem  . Thyroid disease   :  Past Surgical History:  Procedure Laterality Date  . BACK SURGERY    . BREAST BIOPSY  1972   benign  . CARPAL TUNNEL RELEASE Left 12/17/2018   Procedure: LEFT CARPAL TUNNEL RELEASE;  Surgeon: Leanora Cover, MD;  Location: [facility];  Service: Orthopedics;  Laterality: Left;  . CHOLECYSTECTOMY    . LUMBAR LAMINECTOMY  1999   L4  . LUMBAR LAMINECTOMY  1981   L5  . TOTAL HIP ARTHROPLASTY Right 07/02/2012   Procedure: RIGHT TOTAL HIP ARTHROPLASTY ANTERIOR APPROACH;  Surgeon: Mcarthur Rossetti, MD;  Location:  [facility];  Service: Orthopedics;  Laterality: Right;  :   Current Outpatient Medications:  .  ALPRAZolam (XANAX) 0.5 MG tablet, TAKE ONE TABLET DAILY AT BEDTIME AS NEEDED FOR SLEEP, Disp: 30 tablet, Rfl: 0 .  glucosamine-chondroitin 500-400 MG tablet, Take 1 tablet by mouth 2 (two) times daily., Disp: , Rfl:  .  levothyroxine (SYNTHROID) 125 MCG tablet, Take 1 tablet (125 mcg total) by mouth daily., Disp: 90 tablet, Rfl: 3 .  Magnesium 400 MG CAPS, Take by mouth., Disp: , Rfl:  .  Multiple Vitamin (MULTIVITAMIN) tablet, Take 1 tablet by mouth daily. , Disp: , Rfl:  .  Multiple Vitamins-Minerals (PRESERVISION AREDS 2 PO), Take by mouth., Disp: , Rfl:  .  OVER THE COUNTER MEDICATION,  Vitamin D 3  One daily, Disp: , Rfl:  .  potassium chloride SA (K-DUR) 20 MEQ tablet, TAKE 1 TABLET BY MOUTH EVERY DAY, Disp: 90 tablet, Rfl: 0 .  pyridOXINE (VITAMIN B-6) 100 MG tablet, Take 100 mg by mouth 2 times daily at 12 noon and 4 pm., Disp: , Rfl:  .  rOPINIRole (REQUIP) 0.5 MG tablet, Take 0.5 mg by mouth at bedtime., Disp: , Rfl:  .  torsemide (DEMADEX) 20 MG tablet, Take 1 tablet (20 mg total) by mouth 2 (two) times daily. (Patient taking differently: Take 40 mg by mouth 2 (two) times daily. ), Disp: 180 tablet, Rfl: 1 .  valACYclovir (VALTREX) 500 MG tablet, Take 1 tablet (500 mg total) by mouth daily., Disp: 90 tablet, Rfl: 3 .  buPROPion (WELLBUTRIN XL) 150 MG 24 hr tablet, TAKE 1 TABLET BY MOUTH EVERY DAY, Disp: 90 tablet, Rfl: 3 .  predniSONE (DELTASONE) 10 MG tablet, TAKE IN TAPERING COURSE 6,5,4,3,2,1, Disp: 21 tablet, Rfl: 0:  :  Allergies  Allergen Reactions  . Betadine [Povidone Iodine] Rash and Other (See Comments)    blisters  . Penicillins Anaphylaxis  . Levofloxacin Other (See Comments)    Muscle weakness, couldn't move well  :  Family History  Problem Relation Age of Onset  . Heart disease Father   . Hypertension Sister   . Heart disease Brother   . Asthma Daughter   . Hypertension Sister   :  Social History   Socioeconomic History  . Marital status: Married    Spouse name: Not on file  . Number of children: Not on file  . Years of education: Not on file  . Highest education level: Not on file  Occupational History  . Not on file  Social Needs  . Financial resource strain: Not on file  . Food insecurity    Worry: Not on file    Inability: Not on file  . Transportation needs    Medical: Not on file    Non-medical: Not on file  Tobacco Use  . Smoking status: Never Smoker  . Smokeless tobacco: Never Used  Substance and Sexual Activity  . Alcohol use: Yes    Comment: socially  . Drug use: No  . Sexual activity: Not on file  Lifestyle  .  Physical activity    Days per week: Not on file    Minutes per session: Not on file  . Stress: Not on file  Relationships  . Social Herbalist on phone: Not on file    Gets together: Not on file    Attends religious service: Not on file    Active member of club or organization: Not on file  Attends meetings of clubs or organizations: Not on file    Relationship status: Not on file  . Intimate partner violence    Fear of current or ex partner: Not on file    Emotionally abused: Not on file    Physically abused: Not on file    Forced sexual activity: Not on file  Other Topics Concern  . Not on file  Social History Narrative  . Not on file  :  Review of Systems  Constitutional: Positive for malaise/fatigue.  HENT: Negative.   Eyes: Negative.   Respiratory: Negative.   Cardiovascular: Positive for leg swelling.  Gastrointestinal: Negative.   Genitourinary: Negative.   Musculoskeletal: Positive for joint pain and myalgias.  Skin: Negative.   Neurological: Negative.   Endo/Heme/Allergies: Negative.   Psychiatric/Behavioral: Negative.      Exam: Youthful appearing white female in no obvious distress.  Her vital signs are temperature 97.8.  Pulse 72.  Blood pressure 143/76.  Weight is 151 pounds.  Head neck exam shows a normocephalic atraumatic skull.  She has no ocular or oral lesions.  There is no tongue swelling.  She has no adenopathy in the neck.  Thyroid is not palpable.  Lungs are clear bilaterally.  Cardiac exam regular rate and rhythm with no murmurs, rubs or bruits.  Abdomen is soft.  She has good bowel sounds.  There is no fluid wave.  There is no palpable liver or spleen tip.  Back exam shows no tenderness over the spine, ribs or hips.  Extremities shows swelling in her legs.  She has swelling in her hands.  She has decreased range of motion of her joints in her hands.  She does have the carpal tunnel surgical scar on the left hand.  She has brawny edema with  her lower legs.  Skin exam shows no obvious rashes.  Neurological exam shows no focal neurological deficits.    '@IPVITALS' @   Recent Labs    01/27/19 1039  WBC 8.9  HGB 13.3  HCT 41.3  PLT 367   Recent Labs    01/27/19 1039  NA 139  K 4.0  CL 97*  CO2 34*  GLUCOSE 100*  BUN 16  CREATININE 0.69  CALCIUM 9.7    Blood smear review: Normochromic and normocytic population of red blood cells.  There are no nucleated red blood cells.  I see no teardrop cells.  There are no schistocytes or spherocytes.  I see no rouleaux formation.  White cells appear normal in morphology maturation.  I do not see an increase in eosinophils.  She has no immature myeloid or lymphoid forms.  Platelets are adequate number and size.  Pathology: None    Assessment and Plan: [name] is a very charming 73 year old white female.  I am pleased to say that she certainly does not look her age.  She obviously has been incredibly active for most of her life.  She has been exercising.  When I first saw her, the one thing that really I thought about was scleroderma.  I just wonder if she does not have a collagen vascular issue.  I would think that a rheumatologic evaluation may not be a bad idea.  The other thing that I thought about was amyloidosis.  She had the echocardiogram done which did not show anything that would suggest amyloidosis.  I cannot find anything on her exam that would suggest amyloidosis outside of the joint issues and edema.  She does not have any  renal insufficiency.  She is not anemic.  She does not have a low protein or albumin.  I will have her do a 24-hour urine to see if she does have light chains.  If she does, then I think a bone marrow biopsy would be the next step.  I spent about an hour with [name].  Again, she is incredibly delightful to talk with.  I like her attitude.  She is obviously getting fantastic care by Dr. Renold Genta.  We will see what we can find with the 24-hour  urine.  If this is negative, I am not sure what else that we can do for her from a hematologic point of view.  Eosinophilia has resolved.  I think that the steroids may have had something to do with this.  I would think that in the face of a negative 24-hour urine, a bone marrow biopsy would be low yield.  We will await the results of the 24-hour urine.  We will see what rest of her lab work looks like.  Again, I just wonder if she does not have a collagen vascular issue, such as scleroderma or some kind of systemic sclerosis that might be causing this issue.  I do not think we need to do any scans on her.  I cannot palpate her liver or spleen.  I see nothing that would suggest hepatic insufficiency.  This is truly complicated.  I am sure that we will be able to help Dr. Renold Genta try to figure out what is going on so that [name] will enjoy a better quality of life.  I did go ahead and give her a prayer blanket which she very much appreciated.

## 2019-01-28 ENCOUNTER — Telehealth: Payer: Self-pay | Admitting: Hematology & Oncology

## 2019-01-28 NOTE — Telephone Encounter (Signed)
NO LOS FOR 11/12 DOS

## 2019-01-31 DIAGNOSIS — M2540 Effusion, unspecified joint: Secondary | ICD-10-CM | POA: Diagnosis not present

## 2019-01-31 DIAGNOSIS — Z79899 Other long term (current) drug therapy: Secondary | ICD-10-CM | POA: Diagnosis not present

## 2019-01-31 DIAGNOSIS — Z8249 Family history of ischemic heart disease and other diseases of the circulatory system: Secondary | ICD-10-CM | POA: Diagnosis not present

## 2019-01-31 DIAGNOSIS — R5383 Other fatigue: Secondary | ICD-10-CM | POA: Diagnosis not present

## 2019-01-31 DIAGNOSIS — D721 Eosinophilia, unspecified: Secondary | ICD-10-CM | POA: Diagnosis not present

## 2019-02-02 LAB — UPEP/UIFE/LIGHT CHAINS/TP, 24-HR UR
% BETA, Urine: 0 %
ALPHA 1 URINE: 0 %
Albumin, U: 100 %
Alpha 2, Urine: 0 %
Free Kappa Lt Chains,Ur: 2.12 mg/L (ref 0.63–113.79)
Free Kappa/Lambda Ratio: >3.31 (ref 1.03–31.76)
Free Lambda Lt Chains,Ur: <0.64 mg/L (ref 0.47–11.77)
GAMMA GLOBULIN URINE: 0 %
Total Protein, Urine-Ur/day: <94 mg/24 hr (ref 30–150)
Total Protein, Urine: <4 mg/dL
Total Volume: 2350

## 2019-02-03 ENCOUNTER — Telehealth: Payer: Self-pay | Admitting: Internal Medicine

## 2019-02-03 ENCOUNTER — Telehealth: Payer: Self-pay | Admitting: *Deleted

## 2019-02-03 MED ORDER — CYCLOBENZAPRINE HCL 10 MG PO TABS: 10.0000 mg | ORAL_TABLET | Freq: Three times a day (TID) | ORAL | 5 refills | Status: DC | PRN

## 2019-02-03 NOTE — Telephone Encounter (Signed)
Received Fax RX request from  Pharmacy - [facility] - [address],  [place] [address]  Medication - Cyclobenzaprine 10 mg tablet  Last Refill -   Last OV - 12/17/18  Last CPE - 01/07/19

## 2019-02-03 NOTE — Telephone Encounter (Signed)
Patient notified per order of Dr. Marin Olp that "the urine test was normal!!  NO abnormal protein!!"  Pt appreciative of assistance and would like to know what's next for her with Dr. Marin Olp.  Pt notified per order of Dr. Marin Olp that he would like her to come back in four months for labs and to see Dr. Marin Olp.  Pt appreciative of assistance and has no further questions or concerns at this time.  Message sent to scheduling.

## 2019-02-03 NOTE — Telephone Encounter (Signed)
-----   Message from Volanda Napoleon, MD sent at 02/02/2019  5:18 PM EST ----- Call - the urine test was normal!!  NO abnormal protein!!  Laurey Arrow

## 2019-02-04 ENCOUNTER — Telehealth: Payer: Self-pay | Admitting: *Deleted

## 2019-02-04 ENCOUNTER — Telehealth: Payer: Self-pay | Admitting: Orthopaedic Surgery

## 2019-02-04 NOTE — Telephone Encounter (Signed)
Can we make her an appt He wants to see her in the office

## 2019-02-04 NOTE — Telephone Encounter (Signed)
Patient's husband called stating that the patient is has been having issue with inflammation in the hip that Dr. Ninfa Linden replaced back in 2014.  She has been having issues since March of this year.  He is wanting to know if there could a allergic reaction to the metal.  She has had blood work among other tests.  They are getting a referral to a rheumatologist.  [phone].  Thank you.

## 2019-02-04 NOTE — Telephone Encounter (Signed)
We need to see her in the office for a routine visit to get new x-rays and to check her out.  Usually one does not see allergic reactions to the implants from total hips.  We would have see something early-on.  Rare, but more common in total knees.

## 2019-02-04 NOTE — Telephone Encounter (Signed)
Message received from patient requesting a referral to a Rheumatologist.  Dr. Marin Olp notified and order received for pt to contact her PCP for Rheumatology referral.  Call placed back to patient to notify her of MD orders. Pt states that she will contact Dr. Verlene Mayer office for referral.

## 2019-02-04 NOTE — Telephone Encounter (Signed)
See below

## 2019-02-07 ENCOUNTER — Ambulatory Visit: Payer: Medicare Other | Admitting: Physician Assistant

## 2019-02-07 ENCOUNTER — Telehealth: Payer: Self-pay | Admitting: Orthopaedic Surgery

## 2019-02-07 NOTE — Progress Notes (Signed)
Referral sent to Dr Gavin Pound

## 2019-02-07 NOTE — Telephone Encounter (Signed)
Patient called stating that the only thing that is not bothering her is hip that Dr. Ninfa Linden replaced.  She also wanted to let him know that they are working her into a Rheumatologist.  She has cancelled her appointment for today.  CB#[phone].  Thank you.

## 2019-02-14 LAB — MPN-ET/MYELOFIBROSIS (JAK2 V617F-MPL515-CALR)

## 2019-02-18 DIAGNOSIS — M2550 Pain in unspecified joint: Secondary | ICD-10-CM | POA: Diagnosis not present

## 2019-02-18 DIAGNOSIS — L4050 Arthropathic psoriasis, unspecified: Secondary | ICD-10-CM

## 2019-02-18 DIAGNOSIS — L409 Psoriasis, unspecified: Secondary | ICD-10-CM | POA: Diagnosis not present

## 2019-02-18 DIAGNOSIS — R5383 Other fatigue: Secondary | ICD-10-CM | POA: Diagnosis not present

## 2019-02-18 DIAGNOSIS — E663 Overweight: Secondary | ICD-10-CM | POA: Diagnosis not present

## 2019-02-18 DIAGNOSIS — Z6825 Body mass index (BMI) 25.0-25.9, adult: Secondary | ICD-10-CM | POA: Diagnosis not present

## 2019-02-18 DIAGNOSIS — M064 Inflammatory polyarthropathy: Secondary | ICD-10-CM | POA: Diagnosis not present

## 2019-02-18 HISTORY — DX: Arthropathic psoriasis, unspecified: L40.50

## 2019-03-01 ENCOUNTER — Other Ambulatory Visit: Payer: Self-pay | Admitting: Internal Medicine

## 2019-03-04 ENCOUNTER — Other Ambulatory Visit: Payer: Self-pay

## 2019-03-04 ENCOUNTER — Other Ambulatory Visit: Payer: Medicare Other | Admitting: Internal Medicine

## 2019-03-04 DIAGNOSIS — E039 Hypothyroidism, unspecified: Secondary | ICD-10-CM

## 2019-03-05 LAB — TSH: TSH: 0.87 mIU/L (ref 0.40–4.50)

## 2019-03-14 DIAGNOSIS — L4050 Arthropathic psoriasis, unspecified: Secondary | ICD-10-CM | POA: Insufficient documentation

## 2019-03-16 ENCOUNTER — Other Ambulatory Visit: Payer: Self-pay | Admitting: Internal Medicine

## 2019-03-16 ENCOUNTER — Other Ambulatory Visit: Payer: Self-pay

## 2019-03-16 ENCOUNTER — Ambulatory Visit (INDEPENDENT_AMBULATORY_CARE_PROVIDER_SITE_OTHER): Payer: Medicare Other | Admitting: Vascular Surgery

## 2019-03-16 ENCOUNTER — Encounter: Payer: Self-pay | Admitting: Vascular Surgery

## 2019-03-16 VITALS — BP 144/80 | HR 77 | Temp 97.7°F | Resp 18 | Ht 64.5 in | Wt 141.3 lb

## 2019-03-16 DIAGNOSIS — I83812 Varicose veins of left lower extremities with pain: Secondary | ICD-10-CM

## 2019-03-16 NOTE — Progress Notes (Signed)
Patient is a 73 year old female who returns for follow-up today.  She was previously seen by one of our PAs Arlee Muslim on November 25, 2018.  That evaluation for was for leg swelling.  She did have evidence of some reflux in the left greater saphenous vein at that time with about a 4 mm vein diameter.  She was given prescription for lower extremity compression stockings.  She has been wearing these and has been compliant with them.  She states her leg symptoms have improved considerably.  However, she thinks this is mainly to addition of a steroid treatment for autoimmune arthritis.  She states her swelling has completely resolved since starting steroid therapy.  She really has no significant symptoms in her lower extremities at this point.  She has no family history of varicose veins.  She has no personal history of DVT.  Past Medical History:  Diagnosis Date  . Allergic bronchitis   . Allergic bronchitis   . Allergy   . Arthritis   . Claustrophobia   . Dependent edema   . GERD (gastroesophageal reflux disease)    occas problem - burning, cough would take prilosec if needed  . H/O hiatal hernia   . HSV-2 (herpes simplex virus 2) infection   . Hypothyroidism   . Peripheral neuropathy    3 toes right foot 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 problem  . Psoriatic arthritis ([place]) 02/18/2019  . Thyroid disease     Past Surgical History:  Procedure Laterality Date  . BACK SURGERY    . BREAST BIOPSY  1972   benign  . CARPAL TUNNEL RELEASE Left 12/17/2018   Procedure: LEFT CARPAL TUNNEL RELEASE;  Surgeon: Leanora Cover, MD;  Location: [facility];  Service: Orthopedics;  Laterality: Left;  . CHOLECYSTECTOMY    . LUMBAR LAMINECTOMY  1999   L4  . LUMBAR LAMINECTOMY  1981   L5  . TOTAL HIP ARTHROPLASTY Right 07/02/2012   Procedure: RIGHT TOTAL HIP ARTHROPLASTY ANTERIOR APPROACH;  Surgeon: Mcarthur Rossetti, MD;  Location: [facility];  Service: Orthopedics;  Laterality: Right;     Current Outpatient Medications on File Prior to Visit  Medication Sig Dispense Refill  . ALPRAZolam (XANAX) 0.5 MG tablet TAKE ONE TABLET DAILY AT BEDTIME AS NEEDED FOR SLEEP 30 tablet 0  . buPROPion (WELLBUTRIN XL) 150 MG 24 hr tablet TAKE 1 TABLET BY MOUTH EVERY DAY 90 tablet 3  . cyclobenzaprine (FLEXERIL) 10 MG tablet Take 1 tablet (10 mg total) by mouth 3 (three) times daily as needed for muscle spasms. 30 tablet 5  . glucosamine-chondroitin 500-400 MG tablet Take 1 tablet by mouth 2 (two) times daily.    Marland Kitchen golimumab (SIMPONI ARIA) 50 MG/4ML SOLN injection Inject into the vein every 30 (thirty) days.    Marland Kitchen levothyroxine (SYNTHROID) 125 MCG tablet Take 1 tablet (125 mcg total) by mouth daily. 90 tablet 3  . Magnesium 400 MG CAPS Take by mouth.    . Multiple Vitamin (MULTIVITAMIN) tablet Take 1 tablet by mouth daily.     . Multiple Vitamins-Minerals (PRESERVISION AREDS 2 PO) Take by mouth.    Marland Kitchen OVER THE COUNTER MEDICATION Vitamin D 3  One daily    . potassium chloride SA (K-DUR) 20 MEQ tablet TAKE 1 TABLET BY MOUTH EVERY DAY 90 tablet 0  . predniSONE (DELTASONE) 5 MG tablet Take 15 mg by mouth daily.    Marland Kitchen pyridOXINE (VITAMIN B-6) 100 MG tablet Take 100 mg by mouth 2 times  daily at 12 noon and 4 pm.    . rOPINIRole (REQUIP) 0.5 MG tablet Take 0.5 mg by mouth at bedtime.    . torsemide (DEMADEX) 20 MG tablet TAKE 1 TABLET BY MOUTH 2 TIMES A DAY 180 tablet 1  . valACYclovir (VALTREX) 500 MG tablet Take 1 tablet (500 mg total) by mouth daily. 90 tablet 3  . predniSONE (DELTASONE) 10 MG tablet TAKE IN TAPERING COURSE 6,5,4,3,2,1 (Patient not taking: Reported on 03/16/2019) 21 tablet 0   No current facility-administered medications on file prior to visit.    Review of systems: She has no shortness of breath.  She has no chest pain.  Physical exam:  Vitals:   03/16/19 1331  BP: (!) 144/80  Pulse: 77  Resp: 18  Temp: 97.7 F (36.5 C)  TempSrc: Temporal  SpO2: 100%  Weight: 141 lb  4.8 oz (64.1 kg)  Height: 5' 4.5" (1.638 m)    Extremities: Difficult to palpate pedal pulses bilaterally feet are cool room temperature is also cool  Skin: Scattered spider type varicosities especially in the left pretibial region no other large varicosities palpable  Data: I repeated portions of her ultrasound today with the SonoSite at the bedside.  Left greater saphenous vein diameter is fairly small about 2 mm in diameter and most sections less than this.  Assessment: Patient with evidence of superficial venous reflux in the left leg.  However her symptoms have completely resolved with compression therapy and steroid therapy for psoriatic arthritis.  Plan: The patient will follow-up with [place] on an as-needed basis if her varicose veins get worse in the future.  Patient and I both agree that most likely her leg swelling symptoms were more related to her arthritis than 2 varicose veins in general.  She will continue to wear her compression stockings for symptomatic relief.  Ruta Hinds, MD [facility] Office: [phone]

## 2019-03-21 ENCOUNTER — Encounter: Payer: Self-pay | Admitting: Internal Medicine

## 2019-03-21 ENCOUNTER — Other Ambulatory Visit: Payer: Self-pay

## 2019-03-21 ENCOUNTER — Ambulatory Visit (INDEPENDENT_AMBULATORY_CARE_PROVIDER_SITE_OTHER): Payer: Medicare HMO | Admitting: Internal Medicine

## 2019-03-21 ENCOUNTER — Telehealth: Payer: Self-pay | Admitting: Internal Medicine

## 2019-03-21 VITALS — Ht 64.5 in | Wt 141.0 lb

## 2019-03-21 DIAGNOSIS — G5602 Carpal tunnel syndrome, left upper limb: Secondary | ICD-10-CM | POA: Diagnosis not present

## 2019-03-21 DIAGNOSIS — M79645 Pain in left finger(s): Secondary | ICD-10-CM | POA: Diagnosis not present

## 2019-03-21 DIAGNOSIS — B029 Zoster without complications: Secondary | ICD-10-CM | POA: Diagnosis not present

## 2019-03-21 DIAGNOSIS — L4050 Arthropathic psoriasis, unspecified: Secondary | ICD-10-CM | POA: Diagnosis not present

## 2019-03-21 DIAGNOSIS — M25642 Stiffness of left hand, not elsewhere classified: Secondary | ICD-10-CM | POA: Diagnosis not present

## 2019-03-21 MED ORDER — VALACYCLOVIR HCL 1 G PO TABS: 1000.0000 mg | ORAL_TABLET | Freq: Three times a day (TID) | ORAL | 0 refills | Status: DC

## 2019-03-21 NOTE — Telephone Encounter (Signed)
Set up virtual visit to look at this.

## 2019-03-21 NOTE — Patient Instructions (Addendum)
Valtrex 1 g 3 times a day for 7 days.  Discontinue current dose of Valtrex 500 mg daily for herpes simplex prophylaxis until this prescription has been completed.

## 2019-03-21 NOTE — Telephone Encounter (Signed)
scheduled

## 2019-03-21 NOTE — Telephone Encounter (Signed)
[name] [phone]  Crystalann called to say she has large place and a line of blisters on her left side, she also had a sharp pain in area last night, no fever, No COVID exposure that she knows of,

## 2019-03-21 NOTE — Progress Notes (Signed)
   Subjective:    Patient ID: [name], female    DOB: [birth year], 74 y.o.   MRN: [id number]  HPI 74 year old Female in good health called complaining of rash of left trunk it looks like shingles to her.  She is a retired Marine scientist.  She does take Valtrex for prophylaxis for HSV type II.  Has been history of any type of contact dermatitis.  Rash is not severe or significantly painful but she is afraid it may get worse.  She has not had Shingrix vaccines.   Due to the Coronavirus pandemic she is seen today by interactive audio and video telecommunications.  She is agreeable to visit in this format today.  She is identified using 2 identifiers as Sherry Dalsing. Mason, a longstanding patient in this practice.  Review of Systems no fever chills nausea or vomiting.  No known COVID-19 exposure.     Objective:   Physical Exam Reports being afebrile.  Difficult to see a rash virtually but it does appear that she has herpes zoster left trunk      Assessment & Plan:  Herpes zoster left trunk  Plan: Valtrex 1000 mg 3 times a day for 7 days

## 2019-03-28 DIAGNOSIS — L4050 Arthropathic psoriasis, unspecified: Secondary | ICD-10-CM | POA: Diagnosis not present

## 2019-03-28 DIAGNOSIS — G5602 Carpal tunnel syndrome, left upper limb: Secondary | ICD-10-CM | POA: Diagnosis not present

## 2019-03-28 DIAGNOSIS — M25642 Stiffness of left hand, not elsewhere classified: Secondary | ICD-10-CM | POA: Diagnosis not present

## 2019-03-28 DIAGNOSIS — M79645 Pain in left finger(s): Secondary | ICD-10-CM | POA: Diagnosis not present

## 2019-03-30 ENCOUNTER — Telehealth: Payer: Self-pay | Admitting: Internal Medicine

## 2019-03-30 ENCOUNTER — Ambulatory Visit: Payer: Medicare HMO | Attending: Internal Medicine

## 2019-03-30 DIAGNOSIS — Z20822 Contact with and (suspected) exposure to covid-19: Secondary | ICD-10-CM | POA: Diagnosis not present

## 2019-03-30 NOTE — Telephone Encounter (Signed)
Called patient ask her to call and schedule COVID test today and we will do virtual visit tomorrow.

## 2019-03-30 NOTE — Telephone Encounter (Signed)
[name] [phone]  [name] called to say that she has a productive cough,chills,runny nose, this started on Monday. No COVID exposure that she knows of. She has appointment to get COVID vaccine on Friday.

## 2019-03-30 NOTE — Telephone Encounter (Signed)
COVID test scheduled for this afternoon, Virtual Visit scheduled for tomorrow.

## 2019-03-30 NOTE — Telephone Encounter (Signed)
OK 

## 2019-03-31 ENCOUNTER — Encounter: Payer: Self-pay | Admitting: Internal Medicine

## 2019-03-31 ENCOUNTER — Ambulatory Visit (INDEPENDENT_AMBULATORY_CARE_PROVIDER_SITE_OTHER): Payer: Medicare HMO | Admitting: Internal Medicine

## 2019-03-31 ENCOUNTER — Other Ambulatory Visit: Payer: Self-pay

## 2019-03-31 VITALS — BP 122/68 | Temp 98.3°F | Ht 64.5 in | Wt 141.0 lb

## 2019-03-31 DIAGNOSIS — R05 Cough: Secondary | ICD-10-CM

## 2019-03-31 DIAGNOSIS — R059 Cough, unspecified: Secondary | ICD-10-CM

## 2019-03-31 MED ORDER — ONDANSETRON HCL 4 MG PO TABS: 4.0000 mg | ORAL_TABLET | Freq: Three times a day (TID) | ORAL | 0 refills | Status: DC | PRN

## 2019-04-01 ENCOUNTER — Encounter: Payer: Self-pay | Admitting: Internal Medicine

## 2019-04-01 ENCOUNTER — Other Ambulatory Visit: Payer: Self-pay

## 2019-04-01 ENCOUNTER — Ambulatory Visit (INDEPENDENT_AMBULATORY_CARE_PROVIDER_SITE_OTHER): Payer: Medicare HMO | Admitting: Internal Medicine

## 2019-04-01 VITALS — Ht 64.5 in | Wt 141.0 lb

## 2019-04-01 DIAGNOSIS — Z5181 Encounter for therapeutic drug level monitoring: Secondary | ICD-10-CM | POA: Diagnosis not present

## 2019-04-01 DIAGNOSIS — U071 COVID-19: Secondary | ICD-10-CM | POA: Diagnosis not present

## 2019-04-01 DIAGNOSIS — L4050 Arthropathic psoriasis, unspecified: Secondary | ICD-10-CM

## 2019-04-01 DIAGNOSIS — R05 Cough: Secondary | ICD-10-CM | POA: Diagnosis not present

## 2019-04-01 DIAGNOSIS — Z79899 Other long term (current) drug therapy: Secondary | ICD-10-CM | POA: Diagnosis not present

## 2019-04-01 DIAGNOSIS — Z7952 Long term (current) use of systemic steroids: Secondary | ICD-10-CM

## 2019-04-01 DIAGNOSIS — R059 Cough, unspecified: Secondary | ICD-10-CM

## 2019-04-01 LAB — NOVEL CORONAVIRUS, NAA: SARS-CoV-2, NAA: DETECTED — AB

## 2019-04-01 NOTE — Patient Instructions (Signed)
Covid-19 testing Zofran every 8 hours as needed for nausea. Recommend CXR since pt is coughing. Get pulse ox device to monitor oxygen level.

## 2019-04-01 NOTE — Progress Notes (Signed)
   Subjective:    Patient ID: [name], female    DOB: October 21, 1945, 74 y.o.   MRN: [id number]  HPI Patient called to say she had wet cough, no fever, exhaustion, and nausea. Husband was in a Covid-19 vaccine trial at [facility] and will be finding out today what arm of study he was in. Says she has not been anywhere to get Covid-19 other than grocery store or doctor's office.    Review of Systems see above     Objective:   Physical Exam Not examined spoke to her on phone      Assessment & Plan:  Viral syndrome- possible Covid-19  Plan:Covid-19 testing today. Recommend CXR due to cough. Zofran 4 mgs q 8 hours prn nausea. Quarantine at home until test results are back.Obtain home pulse ox monitor.

## 2019-04-03 ENCOUNTER — Telehealth: Payer: Self-pay | Admitting: Unknown Physician Specialty

## 2019-04-03 ENCOUNTER — Other Ambulatory Visit: Payer: Self-pay | Admitting: Unknown Physician Specialty

## 2019-04-03 DIAGNOSIS — U071 COVID-19: Secondary | ICD-10-CM

## 2019-04-03 NOTE — Telephone Encounter (Signed)
  I connected by phone with [name] on 04/03/2019 at 10:15 AM to discuss the potential use of an new treatment for mild to moderate COVID-19 viral infection in non-hospitalized patients.  This patient is a 74 y.o. female that meets the FDA criteria for Emergency Use Authorization of bamlanivimab or casirivimab\imdevimab.  Has a (+) direct SARS-CoV-2 viral test result  Has mild or moderate COVID-19   Is ? 76 years of age and weighs ? 40 kg  Is NOT hospitalized due to COVID-19  Is NOT requiring oxygen therapy or requiring an increase in baseline oxygen flow rate due to COVID-19  Is within 10 days of symptom onset  Has at least one of the high risk factor(s) for progression to severe COVID-19 and/or hospitalization as defined in EUA.  Specific high risk criteria : >/= 76 yo plus receiving immunosuppressive treatment   I have spoken and communicated the following to the patient or parent/caregiver:  1. FDA has authorized the emergency use of bamlanivimab and casirivimab\imdevimab for the treatment of mild to moderate COVID-19 in adults and pediatric patients with positive results of direct SARS-CoV-2 viral testing who are 66 years of age and older weighing at least 40 kg, and who are at high risk for progressing to severe COVID-19 and/or hospitalization.  2. The significant known and potential risks and benefits of bamlanivimab and casirivimab\imdevimab, and the extent to which such potential risks and benefits are unknown.  3. Information on available alternative treatments and the risks and benefits of those alternatives, including clinical trials.  4. Patients treated with bamlanivimab and casirivimab\imdevimab should continue to self-isolate and use infection control measures (e.g., wear mask, isolate, social distance, avoid sharing personal items, clean and disinfect "high touch" surfaces, and frequent handwashing) according to CDC guidelines.   5. The patient or parent/caregiver  has the option to accept or refuse bamlanivimab or casirivimab\imdevimab .  After reviewing this information with the patient, The patient agreed to proceed with receiving the bamlanimivab infusion and will be provided a copy of the Fact sheet prior to receiving the infusion.   [name] 04/03/2019 10:15 AM Sx onset 1/12

## 2019-04-03 NOTE — Patient Instructions (Signed)
Take Zofran every 8 hours as needed for nausea.  Rest and drink plenty of fluids.  May qualify for infusion therapy.  Keep close contact with this office.  Call if symptoms worsen.  Monitor oxygen level.

## 2019-04-03 NOTE — Progress Notes (Signed)
   Subjective:    Patient ID: [name], female    DOB: June 13, 1945, 74 y.o.   MRN: [id number]  HPI 74 year old Female seen today by interactive audio and video telecommunications due to the Coronavirus pandemic.  She is identified using 2 identifiers as [name]. [name], a patient in this practice.  She is agreeable to visit in this format today.  Patient recently diagnosed with COVID-19 infection.  Spoke with her by phone on January 15.  She is not terribly ill but is at risk for complications due to her age and she is on Simponi for psoriatic arthritis.  Husband received COVID-19 vaccine through a clinical trial at [facility] several months ago.  Patient is quarantining at home.  She has had nausea for which Zofran was prescribed recently.  She is on prednisone 15 mg daily for psoriatic arthritis.  Has pulse oximetry for home use and says oxygen levels have been quite good and stable.  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  Review of Systems no vomiting but has had nausea which is being treated with Zofran.  Has had cough chills runny nose and cannot figure where she got Covid except she does go for hand therapy for psoriatic arthritis     Objective:   Physical Exam Seen virtually.  Is seen in bed.  Looks a bit pale but in no acute distress.  No shortness of breath.  Reports that she is afebrile. Some cough and congestion.      Assessment & Plan:  COVID-19 infection  Psoriatic arthritis on DMARD therapy  Plan: Zofran every 8 hours as needed for nausea.  Rest and drink plenty of fluids and stay well-hydrated.   April 03, 2019 Addendum: She may be a candidate for infusion therapy due to DMARD therapy and age.  Being evaluated for this.

## 2019-04-04 ENCOUNTER — Encounter (INDEPENDENT_AMBULATORY_CARE_PROVIDER_SITE_OTHER): Payer: Self-pay

## 2019-04-04 NOTE — Telephone Encounter (Signed)
Faxed positive COVID results to [facility]

## 2019-04-05 ENCOUNTER — Ambulatory Visit (HOSPITAL_COMMUNITY)
Admission: RE | Admit: 2019-04-05 | Discharge: 2019-04-05 | Disposition: A | Discharge status: Home / Self Care | Payer: Medicare Other | Source: Ambulatory Visit | Attending: Pulmonary Disease | Admitting: Pulmonary Disease

## 2019-04-05 DIAGNOSIS — Z23 Encounter for immunization: Secondary | ICD-10-CM | POA: Diagnosis not present

## 2019-04-05 DIAGNOSIS — U071 COVID-19: Secondary | ICD-10-CM | POA: Insufficient documentation

## 2019-04-05 MED ORDER — SODIUM CHLORIDE 0.9 % IV SOLN: INTRAVENOUS | Status: DC | PRN

## 2019-04-05 MED ORDER — METHYLPREDNISOLONE SODIUM SUCC 125 MG IJ SOLR: 125.0000 mg | Freq: Once | INTRAMUSCULAR | Status: DC | PRN

## 2019-04-05 MED ORDER — DIPHENHYDRAMINE HCL 50 MG/ML IJ SOLN: 50.0000 mg | Freq: Once | INTRAMUSCULAR | Status: DC | PRN

## 2019-04-05 MED ORDER — EPINEPHRINE 0.3 MG/0.3ML IJ SOAJ: 0.3000 mg | Freq: Once | INTRAMUSCULAR | Status: DC | PRN

## 2019-04-05 MED ORDER — SODIUM CHLORIDE 0.9 % IV SOLN
700.0000 mg | Freq: Once | INTRAVENOUS | Status: AC
  Administered 2019-04-05: 700 mg via INTRAVENOUS
  Filled 2019-04-05: qty 20

## 2019-04-05 MED ORDER — FAMOTIDINE IN NACL 20-0.9 MG/50ML-% IV SOLN: 20.0000 mg | Freq: Once | INTRAVENOUS | Status: DC | PRN

## 2019-04-05 MED ORDER — ALBUTEROL SULFATE HFA 108 (90 BASE) MCG/ACT IN AERS: 2.0000 | INHALATION_SPRAY | Freq: Once | RESPIRATORY_TRACT | Status: DC | PRN

## 2019-04-05 NOTE — Discharge Instructions (Signed)
10 Things You Can Do to Manage Your COVID-19 Symptoms at Home If you have possible or confirmed COVID-19: 1. Stay home from work and school. And stay away from other public places. If you must go out, avoid using any kind of public transportation, ridesharing, or taxis. 2. Monitor your symptoms carefully. If your symptoms get worse, call your healthcare provider immediately. 3. Get rest and stay hydrated. 4. If you have a medical appointment, call the healthcare provider ahead of time and tell them that you have or may have COVID-19. 5. For medical emergencies, call 911 and notify the dispatch personnel that you have or may have COVID-19. 6. Cover your cough and sneezes with a tissue or use the inside of your elbow. 7. Wash your hands often with soap and water for at least 20 seconds or clean your hands with an alcohol-based hand sanitizer that contains at least 60% alcohol. 8. As much as possible, stay in a specific room and away from other people in your home. Also, you should use a separate bathroom, if available. If you need to be around other people in or outside of the home, wear a mask. 9. Avoid sharing personal items with other people in your household, like dishes, towels, and bedding. 10. Clean all surfaces that are touched often, like counters, tabletops, and doorknobs. Use household cleaning sprays or wipes according to the label instructions. cdc.gov/coronavirus 09/15/2018 This information is not intended to replace advice given to you by your health care provider. Make sure you discuss any questions you have with your health care provider. Document Revised: 02/17/2019 Document Reviewed: 02/17/2019 Elsevier Patient Education  2020 Elsevier Inc.  What types of side effects do monoclonal antibody drugs cause?  Common side effects  In general, the more common side effects caused by monoclonal antibody drugs include: . Allergic reactions, such as hives or itching . Flu-like signs and  symptoms, including chills, fatigue, fever, and muscle aches and pains . Nausea, vomiting . Diarrhea . Skin rashes . Low blood pressure   The CDC is recommending patients who receive monoclonal antibody treatments wait at least 90 days before being vaccinated.  Currently, there are no data on the safety and efficacy of mRNA COVID-19 vaccines in persons who received monoclonal antibodies or convalescent plasma as part of COVID-19 treatment. Based on the estimated half-life of such therapies as well as evidence suggesting that reinfection is uncommon in the 90 days after initial infection, vaccination should be deferred for at least 90 days, as a precautionary measure until additional information becomes available, to avoid interference of the antibody treatment with vaccine-induced immune responses.  What types of side effects do monoclonal antibody drugs cause?  Common side effects  In general, the more common side effects caused by monoclonal antibody drugs include: . Allergic reactions, such as hives or itching . Flu-like signs and symptoms, including chills, fatigue, fever, and muscle aches and pains . Nausea, vomiting . Diarrhea . Skin rashes . Low blood pressure   The CDC is recommending patients who receive monoclonal antibody treatments wait at least 90 days before being vaccinated.  Currently, there are no data on the safety and efficacy of mRNA COVID-19 vaccines in persons who received monoclonal antibodies or convalescent plasma as part of COVID-19 treatment. Based on the estimated half-life of such therapies as well as evidence suggesting that reinfection is uncommon in the 90 days after initial infection, vaccination should be deferred for at least 90 days, as a precautionary measure until   additional information becomes available, to avoid interference of the antibody treatment with vaccine-induced immune responses. 

## 2019-04-05 NOTE — Progress Notes (Signed)
  Diagnosis: COVID-19  Physician: Dr. Wright  Procedure: Covid Infusion Clinic Med: bamlanivimab infusion - Provided patient with bamlanimivab fact sheet for patients, parents and caregivers prior to infusion.  Complications: No immediate complications noted.  Discharge: Discharged home   Nil Bolser R Zhana Jeangilles 04/05/2019   

## 2019-04-12 ENCOUNTER — Telehealth: Payer: Self-pay | Admitting: Internal Medicine

## 2019-04-12 NOTE — Telephone Encounter (Signed)
Called to check on [name], she is feeling much better. Patient stated she had gone for infusion on Tuesday and by Friday she was so much better. Infusion made all the difference in the world.  She defiantly feels like she is on the road to recover now. Thank You so much

## 2019-04-14 DIAGNOSIS — G5602 Carpal tunnel syndrome, left upper limb: Secondary | ICD-10-CM | POA: Diagnosis not present

## 2019-04-14 DIAGNOSIS — M79645 Pain in left finger(s): Secondary | ICD-10-CM | POA: Diagnosis not present

## 2019-04-14 DIAGNOSIS — L4050 Arthropathic psoriasis, unspecified: Secondary | ICD-10-CM | POA: Diagnosis not present

## 2019-04-14 DIAGNOSIS — M25642 Stiffness of left hand, not elsewhere classified: Secondary | ICD-10-CM | POA: Diagnosis not present

## 2019-04-15 ENCOUNTER — Encounter (INDEPENDENT_AMBULATORY_CARE_PROVIDER_SITE_OTHER): Payer: Self-pay

## 2019-04-16 ENCOUNTER — Encounter (INDEPENDENT_AMBULATORY_CARE_PROVIDER_SITE_OTHER): Payer: Self-pay

## 2019-06-06 ENCOUNTER — Ambulatory Visit: Payer: Medicare Other | Admitting: Hematology & Oncology

## 2019-06-06 ENCOUNTER — Other Ambulatory Visit: Payer: Medicare Other

## 2019-06-07 ENCOUNTER — Other Ambulatory Visit: Payer: Self-pay

## 2019-06-07 ENCOUNTER — Other Ambulatory Visit: Payer: Medicare Other | Admitting: Internal Medicine

## 2019-06-07 DIAGNOSIS — E039 Hypothyroidism, unspecified: Secondary | ICD-10-CM

## 2019-06-08 LAB — TSH: TSH: 0.12 mIU/L — ABNORMAL LOW (ref 0.40–4.50)

## 2019-06-09 ENCOUNTER — Ambulatory Visit (INDEPENDENT_AMBULATORY_CARE_PROVIDER_SITE_OTHER): Payer: Medicare Other | Admitting: Internal Medicine

## 2019-06-09 ENCOUNTER — Other Ambulatory Visit: Payer: Self-pay

## 2019-06-09 ENCOUNTER — Encounter: Payer: Self-pay | Admitting: Internal Medicine

## 2019-06-09 VITALS — BP 140/90 | HR 73 | Temp 98.2°F | Ht 64.5 in | Wt 150.0 lb

## 2019-06-09 DIAGNOSIS — Z8616 Personal history of COVID-19: Secondary | ICD-10-CM | POA: Diagnosis not present

## 2019-06-09 DIAGNOSIS — R7989 Other specified abnormal findings of blood chemistry: Secondary | ICD-10-CM

## 2019-06-09 DIAGNOSIS — E039 Hypothyroidism, unspecified: Secondary | ICD-10-CM | POA: Diagnosis not present

## 2019-06-09 DIAGNOSIS — R5383 Other fatigue: Secondary | ICD-10-CM | POA: Diagnosis not present

## 2019-06-09 MED ORDER — LEVOTHYROXINE SODIUM 112 MCG PO TABS: 112.0000 ug | ORAL_TABLET | Freq: Every day | ORAL | 0 refills | Status: DC

## 2019-06-09 NOTE — Patient Instructions (Signed)
Change Synthroid to 0.112 mg daily and follow-up in 6 weeks.

## 2019-06-09 NOTE — Progress Notes (Signed)
   Subjective:    Patient ID: [name], female    DOB: 09-Oct-1945, 74 y.o.   MRN: [id number]  HPI 74 year old  seen regarding hypothyroidism now on Prednisone 7.5 mg daily per Rheumatologist. Is on Simponi. Had Covid-19 but had relatively mild case. She went for outpatient monoclonal antibody infusion. Recovered quickly. Cough resolved.  TSH is low at 0.12 on Synthroid 125 mcg daily.  Having issues with hand pain-seen rheumatologist and is on DMARD for inflammatory arthritis  Review of Systems feels tired.     Objective:   Physical Exam BP 140/90, pulse 73, Pulse ox 97% Weight 150 pounds, t 98.2 degrees No thyromegaly and no cervical adenopathy      Assessment & Plan:  Low TSH-change dose of Synthroid to 0.112 mg daily and follow-up in 6 weeks.  Patient prefers brand name Synthroid  Hypothyroidism  History of COVID-19 infection  Inflammatory arthritis treated with DMARD by rheumatologist

## 2019-06-16 ENCOUNTER — Ambulatory Visit: Payer: Medicare Other | Attending: Internal Medicine

## 2019-06-16 DIAGNOSIS — Z23 Encounter for immunization: Secondary | ICD-10-CM

## 2019-06-16 NOTE — Progress Notes (Signed)
   Covid-19 Vaccination Clinic  Name:  [name]    MRN: [id number] DOB: February 29, 1946  06/16/2019  [name] was observed post Covid-19 immunization for 30 minutes based on pre-vaccination screening without incident. She was provided with Vaccine Information Sheet and instruction to access the V-Safe system.   [name] was instructed to call 911 with any severe reactions post vaccine: Marland Kitchen Difficulty breathing  . Swelling of face and throat  . A fast heartbeat  . A bad rash all over body  . Dizziness and weakness   Immunizations Administered    Name Date Dose VIS Date Route   Pfizer COVID-19 Vaccine 06/16/2019  8:58 AM 0.3 mL 02/25/2019 Intramuscular   Manufacturer: [place]   Lot: [id number]   [place]: [id number]

## 2019-07-11 ENCOUNTER — Ambulatory Visit: Payer: Medicare Other | Attending: Internal Medicine

## 2019-07-11 DIAGNOSIS — Z23 Encounter for immunization: Secondary | ICD-10-CM

## 2019-07-11 NOTE — Progress Notes (Signed)
   Covid-19 Vaccination Clinic  Name:  [name]    MRN: [id number] DOB: 02/20/1946  07/11/2019  [name] was observed post Covid-19 immunization for 15 minutes without incident. She was provided with Vaccine Information Sheet and instruction to access the V-Safe system.   [name] was instructed to call 911 with any severe reactions post vaccine: Marland Kitchen Difficulty breathing  . Swelling of face and throat  . A fast heartbeat  . A bad rash all over body  . Dizziness and weakness   Immunizations Administered    Name Date Dose VIS Date Route   Pfizer COVID-19 Vaccine 07/11/2019  8:48 AM 0.3 mL 05/11/2018 Intramuscular   Manufacturer: [place]   Lot: [id number]   [place]: [id number]

## 2019-07-19 ENCOUNTER — Other Ambulatory Visit: Payer: Self-pay

## 2019-07-19 ENCOUNTER — Other Ambulatory Visit: Payer: Medicare Other | Admitting: Internal Medicine

## 2019-07-19 DIAGNOSIS — E039 Hypothyroidism, unspecified: Secondary | ICD-10-CM

## 2019-07-19 LAB — TSH: TSH: 0.56 mIU/L (ref 0.40–4.50)

## 2019-07-21 ENCOUNTER — Other Ambulatory Visit: Payer: Medicare Other | Admitting: Internal Medicine

## 2019-08-09 ENCOUNTER — Other Ambulatory Visit: Payer: Self-pay | Admitting: Internal Medicine

## 2019-08-13 ENCOUNTER — Telehealth (INDEPENDENT_AMBULATORY_CARE_PROVIDER_SITE_OTHER): Payer: Medicare Other | Admitting: Internal Medicine

## 2019-08-13 MED ORDER — ROPINIROLE HCL 0.5 MG PO TABS: 0.5000 mg | ORAL_TABLET | Freq: Every day | ORAL | 0 refills | Status: DC

## 2019-08-13 NOTE — Telephone Encounter (Signed)
Received request on refill on Requip from [facility] in [facility] where she has a home.E-scribed #[id number] with no refill

## 2019-08-17 ENCOUNTER — Other Ambulatory Visit: Payer: Self-pay

## 2019-08-17 MED ORDER — ROPINIROLE HCL 0.5 MG PO TABS: 0.5000 mg | ORAL_TABLET | Freq: Every day | ORAL | 5 refills | Status: DC

## 2019-10-11 ENCOUNTER — Other Ambulatory Visit: Payer: Self-pay | Admitting: Internal Medicine

## 2019-11-02 ENCOUNTER — Other Ambulatory Visit: Payer: Self-pay | Admitting: Internal Medicine

## 2019-11-03 NOTE — Telephone Encounter (Signed)
Check and see if CPE?Wellness due in October and book before refilling

## 2019-11-07 ENCOUNTER — Other Ambulatory Visit: Payer: Self-pay | Admitting: Internal Medicine

## 2019-12-14 ENCOUNTER — Other Ambulatory Visit: Payer: Self-pay | Admitting: Internal Medicine

## 2019-12-14 NOTE — Telephone Encounter (Signed)
Refill x 6 months 

## 2019-12-19 NOTE — Telephone Encounter (Signed)
RX pended, please sign.  

## 2020-01-10 ENCOUNTER — Ambulatory Visit (INDEPENDENT_AMBULATORY_CARE_PROVIDER_SITE_OTHER): Payer: Medicare Other | Admitting: Cardiology

## 2020-01-10 ENCOUNTER — Encounter: Payer: Self-pay | Admitting: Cardiology

## 2020-01-10 ENCOUNTER — Other Ambulatory Visit: Payer: Self-pay

## 2020-01-10 VITALS — BP 120/84 | HR 62 | Ht 64.0 in | Wt 149.0 lb

## 2020-01-10 DIAGNOSIS — I872 Venous insufficiency (chronic) (peripheral): Secondary | ICD-10-CM | POA: Diagnosis not present

## 2020-01-10 DIAGNOSIS — Z7189 Other specified counseling: Secondary | ICD-10-CM | POA: Diagnosis not present

## 2020-01-10 DIAGNOSIS — R609 Edema, unspecified: Secondary | ICD-10-CM | POA: Diagnosis not present

## 2020-01-10 NOTE — Patient Instructions (Signed)
Medication Instructions:  Your Physician recommend you continue on your current medication as directed.    *If you need a refill on your cardiac medications before your next appointment, please call your pharmacy*   Lab Work: None ordered   Testing/Procedures: None ordered    Follow-Up: At [facility], you and your health needs are our priority.  As part of our continuing mission to provide you with exceptional heart care, we have created designated Provider Care Teams.  These Care Teams include your primary Cardiologist (physician) and Advanced Practice Providers (APPs -  Physician Assistants and Nurse Practitioners) who all work together to provide you with the care you need, when you need it.  We recommend signing up for the patient portal called "MyChart".  Sign up information is provided on this After Visit Summary.  MyChart is used to connect with patients for Virtual Visits (Telemedicine).  Patients are able to view lab/test results, encounter notes, upcoming appointments, etc.  Non-urgent messages can be sent to your provider as well.   To learn more about what you can do with MyChart, go to [email or web address].    Your next appointment:   As needed  The format for your next appointment:   In Person  Provider:   Bridgette Christopher, MD    

## 2020-01-10 NOTE — Progress Notes (Signed)
Cardiology Office Note:    Date:  01/10/2020   ID:  [name], DOB 07-21-1945, MRN [id number]  PCP:  Elby Showers, MD  Cardiologist:  Buford Dresser, MD PhD  Referring MD: Elby Showers, MD   CC: follow up  History of Present Illness:    [name] is a 74 y.o. female with a hx of hypothyroidism, restless leg, arthritis who is seen in follow up today. She was initially seen 10/15/18 as a new consult at the request of Baxley, Cresenciano Lick, MD for the evaluation and management of edema.  Edema history:  She reports that lower extremity edema came on very suddenly in the middle of March 2020. Had URI symptoms at the time but did not get tested for Covid. Went on steroids for her shoulders and all the swelling went away for those 6 days. Felt like a new person. Swelling returned when she stopped the steroids, but again swelling decreased after she had joint injections in her shoulders. She tried lasix initially, which did not help. Tried torsemide, first 20 mg then 40 mg. No change with 20 mg, but lost about 6 lbs on 40 mg torsemide. Usual weight 140-144 lbs.   Workup thus far: DVT scan negative, BNP 30, TSH 5.56, Cr/Na normal. K low, treated with supplementation. ESR normal 07/2018. Arterial LE ultrasounds unremarkable, but venous LE ultrasounds with significant venous insufficiency. Echo with EF 60-65%, grade 1 DD, no significant valve disease, couldn't assess R sided pressures.   Today: Feeling much better after seeing rheumatology (Dr. Trudie Reed). Joints feel so much better, swelling is gone. Gets Simponi infusion every two months. Also on arava, dose recently reduced due to blood work. Down to 5 mg prednisone, slowly tapering off. Did not tolerate methotrexate.  Had seen me, vascular surgery, and hematology before seeing rheumatology. Dr. Fredna Dow, her hand surgeon, was able to get her into rheumatology, and she no longer needs a second carpal tunnel surgery.   Denies chest pain,  shortness of breath at rest or with normal exertion. No PND, orthopnea, LE edema or unexpected weight gain. No syncope or palpitations.   Past Medical History:  Diagnosis Date  . Allergic bronchitis   . Allergic bronchitis   . Allergy   . Arthritis   . Claustrophobia   . Dependent edema   . GERD (gastroesophageal reflux disease)    occas problem - burning, cough would take prilosec if needed  . H/O hiatal hernia   . HSV-2 (herpes simplex virus 2) infection   . Hypothyroidism   . Peripheral neuropathy    3 toes right foot 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 problem  . Psoriatic arthritis ([place]) 02/18/2019  . Thyroid disease     Past Surgical History:  Procedure Laterality Date  . BACK SURGERY    . BREAST BIOPSY  1972   benign  . CARPAL TUNNEL RELEASE Left 12/17/2018   Procedure: LEFT CARPAL TUNNEL RELEASE;  Surgeon: Leanora Cover, MD;  Location: [place];  Service: Orthopedics;  Laterality: Left;  . CHOLECYSTECTOMY    . LUMBAR LAMINECTOMY  1999   L4  . LUMBAR LAMINECTOMY  1981   L5  . TOTAL HIP ARTHROPLASTY Right 07/02/2012   Procedure: RIGHT TOTAL HIP ARTHROPLASTY ANTERIOR APPROACH;  Surgeon: Mcarthur Rossetti, MD;  Location: [facility];  Service: Orthopedics;  Laterality: Right;    Current Medications: Current Outpatient Medications on File Prior to Visit  Medication Sig  . ALPRAZolam (XANAX) 0.5 MG  tablet TAKE 1 TABLET BY MOUTH EACH NIGHT AT BEDTIME AS NEEDED SLEEP  . buPROPion (WELLBUTRIN XL) 150 MG 24 hr tablet TAKE 1 TABLET BY MOUTH EVERY DAY  . cyclobenzaprine (FLEXERIL) 10 MG tablet Take 1 tablet (10 mg total) by mouth 3 (three) times daily as needed for muscle spasms.  Marland Kitchen glucosamine-chondroitin 500-400 MG tablet Take 1 tablet by mouth 2 (two) times daily.  . Golimumab ([place] ARIA IV) Inject into the vein. Every two months  . Magnesium 400 MG CAPS Take by mouth.  . Multiple Vitamin (MULTIVITAMIN) tablet Take 1 tablet by mouth daily.   . Multiple  Vitamins-Minerals (PRESERVISION AREDS 2 PO) Take by mouth.  . ondansetron (ZOFRAN) 4 MG tablet Take 1 tablet (4 mg total) by mouth every 8 (eight) hours as needed for nausea or vomiting.  Marland Kitchen OVER THE COUNTER MEDICATION Vitamin D 3  One daily  . potassium chloride SA (K-DUR) 20 MEQ tablet TAKE 1 TABLET BY MOUTH EVERY DAY  . predniSONE (DELTASONE) 5 MG tablet Take 7.5 mg by mouth daily.   Marland Kitchen pyridOXINE (VITAMIN B-6) 100 MG tablet Take 100 mg by mouth 2 times daily at 12 noon and 4 pm.  . rOPINIRole (REQUIP) 0.5 MG tablet TAKE 1 TABLET BY MOUTH EVERY NIGHT AT BEDTIME  . SYNTHROID 112 MCG tablet TAKE 1 TABLET BY MOUTH EVERY DAY  . torsemide (DEMADEX) 20 MG tablet TAKE 1 TABLET BY MOUTH 2 TIMES A DAY  . valACYclovir (VALTREX) 1000 MG tablet Take 1 tablet (1,000 mg total) by mouth 3 (three) times daily.  . valACYclovir (VALTREX) 500 MG tablet Take 1 tablet (500 mg total) by mouth daily.  Marland Kitchen leflunomide (ARAVA) 10 MG tablet Take 10 mg by mouth daily.    No current facility-administered medications on file prior to visit.     Allergies:   Betadine [povidone iodine], Penicillins, and Levofloxacin   Social History   Tobacco Use  . Smoking status: Never Smoker  . Smokeless tobacco: Never Used  Vaping Use  . Vaping Use: Never used  Substance Use Topics  . Alcohol use: Yes    Comment: socially  . Drug use: No    Family History: The patient's family history includes Asthma in her daughter; Heart disease in her brother and father; Hypertension in her sister and sister.  ROS:   Please see the history of present illness.  Additional pertinent ROS otherwise unremarkable.  EKGs/Labs/Other Studies Reviewed:    The following studies were reviewed today: Echo 10/22/2018  1. The left ventricle has normal systolic function with an ejection  fraction of 60-65%. The cavity size was normal. Left ventricular diastolic  Doppler parameters are consistent with impaired relaxation.  2. The right ventricle  has normal systolic function. The cavity was  normal. There is no increase in right ventricular wall thickness. Right  ventricular systolic pressure could not be assessed.  EKG:  EKG is personally reviewed.  The ekg ordered today demonstrates normal sinus rhythm with artifact, rate 62 bpm  Recent Labs: 01/27/2019: ALT 13; BUN 16; Creatinine 0.69; Hemoglobin 13.3; Platelet Count 367; Potassium 4.0; Sodium 139 07/19/2019: TSH 0.56  Recent Lipid Panel    Component Value Date/Time   CHOL 184 01/03/2019 1016   TRIG 116 01/03/2019 1016   HDL 67 01/03/2019 1016   CHOLHDL 2.7 01/03/2019 1016   VLDL 13 10/02/2015 1103   LDLCALC 96 01/03/2019 1016    Physical Exam:    VS:  BP 120/84   Pulse 62  Ht _0  (1.626 m)   Wt 149 lb (67.6 kg)   SpO2 97%   BMI 25.58 kg/m     Wt Readings from Last 3 Encounters:  01/10/20 149 lb (67.6 kg)  06/09/19 150 lb (68 kg)  04/01/19 141 lb (64 kg)   GEN: Well nourished, well developed in no acute distress HEENT: Normal, moist mucous membranes NECK: No JVD CARDIAC: regular rhythm, normal S1 and S2, no rubs or gallops. No murmur. VASCULAR: Radial and DP pulses 2+ bilaterally. No carotid bruits RESPIRATORY:  Clear to auscultation without rales, wheezing or rhonchi  ABDOMEN: Soft, non-tender, non-distended MUSCULOSKELETAL:  Ambulates independently SKIN: Warm and dry, bilateral mild nonpitting edema NEUROLOGIC:  Alert and oriented x 3. No focal neuro deficits noted. PSYCHIATRIC:  Normal affect   ASSESSMENT:    1. Dependent edema   2. Chronic venous insufficiency   3. Cardiac risk counseling   4. Counseling on health promotion and disease prevention    PLAN:    Bilateral LE edema:  -Sudden onset, steroid responsiveness concerning for something like vasculitis.  -significant venous insufficiency on testing -normal inflammatory markers, until eosinophilia noted -much improved since rheumatology treatments began -normal BNP, echo (only grade 1  DD) do not suggest cardiac etiology -she is being treated/managed for venous insufficiency, appropriately, which we discussed at length today. Continue compression, elevation, activity, torsemide  Cardiac risk counseling and prevention recommendations: -recommend heart healthy/Mediterranean diet, with whole grains, fruits, vegetable, fish, lean meats, nuts, and olive oil. Limit salt. -recommend moderate walking, 3-5 times/week for 30-50 minutes each session. Aim for at least 150 minutes.week. Goal should be pace of 3 miles/hours, or walking 1.5 miles in 30 minutes -recommend avoidance of tobacco products. Avoid excess alcohol. -ASCVD risk score: The 10-year ASCVD risk score Mikey Bussing DC Brooke Bonito., et al., 2013) is: 16%   Values used to calculate the score:     Age: 43 years     Sex: Female     Is Non-Hispanic African American: No     Diabetic: No     Tobacco smoker: No     Systolic Blood Pressure: 161 mmHg     Is BP treated: No     HDL Cholesterol: 67 mg/dL     Total Cholesterol: 184 mg/dL   This is largely driven by age, and she does not currently have other risk factors.   Plan for follow up: as needed  Buford Dresser, MD, PhD [place]  [facility]   Medication Adjustments/Labs and Tests Ordered: Current medicines are reviewed at length with the patient today.  Concerns regarding medicines are outlined above.  Orders Placed This Encounter  Procedures  . EKG 12-Lead   No orders of the defined types were placed in this encounter.   Patient Instructions  Medication Instructions:  Your Physician recommend you continue on your current medication as directed.    *If you need a refill on your cardiac medications before your next appointment, please call your pharmacy*   Lab Work: None ordered   Testing/Procedures: None ordered    Follow-Up: At [facility], you and your health needs are our priority.  As part of our continuing mission to provide you with  exceptional heart care, we have created designated Provider Care Teams.  These Care Teams include your primary Cardiologist (physician) and Advanced Practice Providers (APPs -  Physician Assistants and Nurse Practitioners) who all work together to provide you with the care you need, when you need it.  We recommend  signing up for the patient portal called "MyChart".  Sign up information is provided on this After Visit Summary.  MyChart is used to connect with patients for Virtual Visits (Telemedicine).  Patients are able to view lab/test results, encounter notes, upcoming appointments, etc.  Non-urgent messages can be sent to your provider as well.   To learn more about what you can do with MyChart, go to [email or web address].    Your next appointment:   As needed  The format for your next appointment:   In Person  Provider:   Buford Dresser, MD        Signed, Buford Dresser, MD PhD 01/10/2020  [place]

## 2020-01-17 ENCOUNTER — Other Ambulatory Visit: Payer: Self-pay

## 2020-01-17 ENCOUNTER — Other Ambulatory Visit: Payer: Medicare Other | Admitting: Internal Medicine

## 2020-01-17 DIAGNOSIS — Z0000 Encounter for general adult medical examination without abnormal findings: Secondary | ICD-10-CM

## 2020-01-17 DIAGNOSIS — E039 Hypothyroidism, unspecified: Secondary | ICD-10-CM

## 2020-01-17 DIAGNOSIS — Z23 Encounter for immunization: Secondary | ICD-10-CM

## 2020-01-17 DIAGNOSIS — G4700 Insomnia, unspecified: Secondary | ICD-10-CM

## 2020-01-17 DIAGNOSIS — R7989 Other specified abnormal findings of blood chemistry: Secondary | ICD-10-CM

## 2020-01-17 DIAGNOSIS — Z8619 Personal history of other infectious and parasitic diseases: Secondary | ICD-10-CM

## 2020-01-17 DIAGNOSIS — L4050 Arthropathic psoriasis, unspecified: Secondary | ICD-10-CM

## 2020-01-18 LAB — CBC WITH DIFFERENTIAL/PLATELET
Absolute Monocytes: 860 cells/uL (ref 200–950)
Basophils Absolute: 52 cells/uL (ref 0–200)
Basophils Relative: 1.3 %
Eosinophils Absolute: 152 cells/uL (ref 15–500)
Eosinophils Relative: 3.8 %
HCT: 42.4 % (ref 35.0–45.0)
Hemoglobin: 14.7 g/dL (ref 11.7–15.5)
Lymphs Abs: 1932 cells/uL (ref 850–3900)
MCH: 31.6 pg (ref 27.0–33.0)
MCHC: 34.7 g/dL (ref 32.0–36.0)
MCV: 91.2 fL (ref 80.0–100.0)
MPV: 8.7 fL (ref 7.5–12.5)
Monocytes Relative: 21.5 %
Neutro Abs: 1004 cells/uL — ABNORMAL LOW (ref 1500–7800)
Neutrophils Relative %: 25.1 %
Platelets: 229 10*3/uL (ref 140–400)
RBC: 4.65 10*6/uL (ref 3.80–5.10)
RDW: 12 % (ref 11.0–15.0)
Total Lymphocyte: 48.3 %
WBC: 4 10*3/uL (ref 3.8–10.8)

## 2020-01-18 LAB — COMPLETE METABOLIC PANEL WITH GFR
AG Ratio: 1.7 (calc) (ref 1.0–2.5)
ALT: 43 U/L — ABNORMAL HIGH (ref 6–29)
AST: 40 U/L — ABNORMAL HIGH (ref 10–35)
Albumin: 4 g/dL (ref 3.6–5.1)
Alkaline phosphatase (APISO): 99 U/L (ref 37–153)
BUN: 14 mg/dL (ref 7–25)
CO2: 32 mmol/L (ref 20–32)
Calcium: 9.5 mg/dL (ref 8.6–10.4)
Chloride: 101 mmol/L (ref 98–110)
Creat: 0.78 mg/dL (ref 0.60–0.93)
GFR, Est African American: 86 mL/min/{1.73_m2} (ref >=60)
GFR, Est Non African American: 74 mL/min/{1.73_m2} (ref >=60)
Globulin: 2.3 g/dL (calc) (ref 1.9–3.7)
Glucose, Bld: 85 mg/dL (ref 65–99)
Potassium: 4.4 mmol/L (ref 3.5–5.3)
Sodium: 142 mmol/L (ref 135–146)
Total Bilirubin: 0.6 mg/dL (ref 0.2–1.2)
Total Protein: 6.3 g/dL (ref 6.1–8.1)

## 2020-01-18 LAB — LIPID PANEL
Cholesterol: 196 mg/dL (ref <200)
HDL: 82 mg/dL (ref >=50)
LDL Cholesterol (Calc): 88 mg/dL (calc)
Non-HDL Cholesterol (Calc): 114 mg/dL (calc) (ref <130)
Total CHOL/HDL Ratio: 2.4 (calc) (ref <5.0)
Triglycerides: 160 mg/dL — ABNORMAL HIGH (ref <150)

## 2020-01-18 LAB — TSH: TSH: 0.03 mIU/L — ABNORMAL LOW (ref 0.40–4.50)

## 2020-01-20 ENCOUNTER — Other Ambulatory Visit: Payer: Self-pay

## 2020-01-20 ENCOUNTER — Ambulatory Visit (INDEPENDENT_AMBULATORY_CARE_PROVIDER_SITE_OTHER): Payer: Medicare Other | Admitting: Internal Medicine

## 2020-01-20 ENCOUNTER — Encounter: Payer: Self-pay | Admitting: Internal Medicine

## 2020-01-20 VITALS — BP 120/80 | HR 76 | Ht 63.0 in | Wt 147.0 lb

## 2020-01-20 DIAGNOSIS — Z8619 Personal history of other infectious and parasitic diseases: Secondary | ICD-10-CM | POA: Diagnosis not present

## 2020-01-20 DIAGNOSIS — F32A Depression, unspecified: Secondary | ICD-10-CM

## 2020-01-20 DIAGNOSIS — F419 Anxiety disorder, unspecified: Secondary | ICD-10-CM | POA: Diagnosis not present

## 2020-01-20 DIAGNOSIS — Z96641 Presence of right artificial hip joint: Secondary | ICD-10-CM

## 2020-01-20 DIAGNOSIS — Z8616 Personal history of COVID-19: Secondary | ICD-10-CM | POA: Diagnosis not present

## 2020-01-20 DIAGNOSIS — R609 Edema, unspecified: Secondary | ICD-10-CM

## 2020-01-20 DIAGNOSIS — G2581 Restless legs syndrome: Secondary | ICD-10-CM

## 2020-01-20 DIAGNOSIS — E039 Hypothyroidism, unspecified: Secondary | ICD-10-CM | POA: Diagnosis not present

## 2020-01-20 DIAGNOSIS — Z5181 Encounter for therapeutic drug level monitoring: Secondary | ICD-10-CM

## 2020-01-20 DIAGNOSIS — Z7952 Long term (current) use of systemic steroids: Secondary | ICD-10-CM

## 2020-01-20 DIAGNOSIS — Z0000 Encounter for general adult medical examination without abnormal findings: Secondary | ICD-10-CM

## 2020-01-20 DIAGNOSIS — Z79899 Other long term (current) drug therapy: Secondary | ICD-10-CM

## 2020-01-20 DIAGNOSIS — R7989 Other specified abnormal findings of blood chemistry: Secondary | ICD-10-CM

## 2020-01-20 DIAGNOSIS — L4050 Arthropathic psoriasis, unspecified: Secondary | ICD-10-CM

## 2020-01-20 LAB — POCT URINALYSIS DIPSTICK
Appearance: NEGATIVE
Bilirubin, UA: NEGATIVE
Blood, UA: NEGATIVE
Glucose, UA: NEGATIVE
Ketones, UA: NEGATIVE
Leukocytes, UA: NEGATIVE
Nitrite, UA: NEGATIVE
Odor: NEGATIVE
Protein, UA: NEGATIVE
Spec Grav, UA: 1.01 (ref 1.010–1.025)
Urobilinogen, UA: 0.2 E.U./dL
pH, UA: 7 (ref 5.0–8.0)

## 2020-01-20 MED ORDER — LEVOTHYROXINE SODIUM 100 MCG PO TABS: 100.0000 ug | ORAL_TABLET | Freq: Every day | ORAL | 0 refills | Status: DC

## 2020-01-20 NOTE — Progress Notes (Signed)
Subjective:    Patient ID: [name], female    DOB: 09-28-1945, 74 y.o.   MRN: [id number]  HPI 74  Year old Female for health maintenance exam, Medicare wellness and evaluation of medical issues and evaluation of medical issues.  Last year was diagnosed with  inflammatory arthropathy and is seen by [facility].  Dr. Trudie Reed feels that this is psoriatic arthritis.  She is being treated with Simponi injections every 2 months.  Also takes [place] daily.  Issue started with dependent edema last year.  Was evaluated by Dr. Harrell Gave, cardiologist.  Was treated with Lasix and Demadex.  Continues to take Demadex.  Compression stockings were recommended by vascular surgeon chronic venous insufficiency but was not thought to have peripheral vascular disease.  Compression hose did not seem to help.  Was found to have elevated eosinophil count at 1628 and this had always been normal over the previous 4 years.  Patient noted that edema got better with steroids.  BNP was normal.  Sed rate was 11 but it sounded like she had some sort of inflammatory illness because steroids seem to help her.  Iron and iron binding capacity were checked because of complaints of restless leg syndrome and were normal.  Status post right hip arthroplasty by Dr. Ninfa Linden 2014. She has a healthcare power of attorney and has provided [place] with those documents.  She is allergic to penicillin-causes anaphylaxis  History of GE reflux, allergic rhinitis, neuropathy of right foot and anxiety.  Benign left breast biopsy 1972, lumbar disc surgery L4 1999, lumbar disc surgery at L5 1991.  Cholecystectomy 2004.  Colonoscopy initially done 2007 with 10-year follow-up recommended.  Social history: Her husband is a retired Control and instrumentation engineer and is a Ambulance person [employer].  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  She does not smoke.  Social alcohol consumption.  Patient lost  stepson to suicide in 2018.  She has 2 adult daughters from a previous marriage.  Family history: 1 brother died at age 62 of congestive heart failure secondary to rheumatic fever complications.  1 sister with hypertension obesity.  Another sister with hypertension and obesity.  1 brother living.  Mother deceased with history of neurological problems and heart disease.   History of anxiety depression treated with alprazolam and Wellbutrin.  Takes prednisone 5 mg daily.  History of dependent edema treated with Demadex.  History of hypothyroidism treated with thyroid replacement medication  History of HSV type II treated with Valtrex 500 mg daily.  History of restless leg syndrome treated with Requip.  Had colonoscopy in 2007 and does not want repeat colonoscopy at this point in time.  Will have Cologuard sent to her home.  Review of Systems no new complaints-feels well divides time between [facility] and home in [place]     Objective:   Physical Exam Blood pressure 120/80 pulse 76 regular pulse oximetry 97% weight 147 pounds height 5 feet 3 inches BMI 26.04  Skin is warm and dry.  No cervical adenopathy.  No carotid bruits.  No thyromegaly.  TMs are clear.  Neck is supple.  Chest clear to auscultation.  Cardiac exam regular rate and rhythm.  Breast without masses.  Abdomen soft nondistended without hepatosplenomegaly masses or tenderness.  Sees GYN annually . No lower extremity pitting edema.  Affect felt judgment are normal.  Neuro intact without focal deficits.       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  Doing much better  Hypothyroidism-TSH is low at 0.03.  Change levothyroxine to 100 mcg daily and follow-up in December.  Previously on levothyroxine 0.112 mg daily  Dependent edema  Status post left carpal tunnel release  History of COVID-19-recovered  well  History of right carpal tunnel syndrome  History of restless leg syndrome with no evidence of iron deficiency  Anxiety and depression treated with Xanax and Wellbutrin  Plan: I am pleased that she is done so well with inflammatory arthropathy.  She is feeling much better.  Return in 1 year or as needed.  Has had 3 COVID-19 immunizations.  Has had Prevnar 13 and pneumococcal 23 vaccines.  Tetanus immunization is up-to-date.  Has had flu vaccine.  Subjective:   Patient presents for Medicare Annual/Subsequent preventive examination.  Review Past Medical/Family/Social: See above  Risk Factors  Current exercise habits: Exercises regularly and stays in shape Dietary issues discussed: Low-fat low carbohydrate  Cardiac risk factors: Family history  Depression Screen  (Note: if answer to either of the following is "Yes", a more complete depression screening is indicated)   Over the past two weeks, have you felt down, depressed or hopeless? No  Over the past two weeks, have you felt little interest or pleasure in doing things? No Have you lost interest or pleasure in daily life? No Do you often feel hopeless? Never Do you cry easily over simple problems? No   Activities of Daily Living  In your present state of health, do you have any difficulty performing the following activities?:   Driving? No  Managing money? No  Feeding yourself? No  Getting from bed to chair? No  Climbing a flight of stairs? No  Preparing food and eating?: No  Bathing or showering? No  Getting dressed: No  Getting to the toilet? No  Using the toilet:No  Moving around from place to place: No  In the past year have you fallen or had a near fall?:No  Are you sexually active?  Yes Do you have more than one partner? No   Hearing Difficulties:  Do you often ask people to speak up or repeat themselves?  Yes sometimes Do you experience ringing or noises in your ears? No  Do you have difficulty  understanding soft or whispered voices?  Yes Do you feel that you have a problem with memory? No Do you often misplace items? No    Home Safety:  Do you have a smoke alarm at your residence? Yes Do you have grab bars in the bathroom?  Yes Do you have throw rugs in your house?  Yes   Cognitive Testing  Alert? Yes Normal Appearance?Yes  Oriented to person? Yes Place? Yes  Time? Yes  Recall of three objects? Yes  Can perform simple calculations? Yes  Displays appropriate judgment?Yes  Can read the correct time from a watch face?Yes   List the Names of Other Physician/Practitioners you currently use:  See referral list for the physicians patient is currently seeing.  Rheumatologist   Review of Systems: See above   Objective:     General appearance: Appears younger than stated age and trim Head: Normocephalic, without obvious abnormality, atraumatic  Eyes: conj clear, EOMi PEERLA  Ears: normal TM's and external ear canals both ears  Nose: Nares normal. Septum midline. Mucosa normal. No drainage or sinus tenderness.  Throat: lips, mucosa, and  tongue normal; teeth and gums normal  Neck: no adenopathy, no carotid bruit, no JVD, supple, symmetrical, trachea midline and thyroid not enlarged, symmetric, no tenderness/mass/nodules  No CVA tenderness.  Lungs: clear to auscultation bilaterally   Heart: regular rate and rhythm, S1, S2 normal, no murmur, click, rub or gallop  Abdomen: soft, non-tender; bowel sounds normal; no masses, no organomegaly  Musculoskeletal: ROM normal in all joints, no crepitus, no deformity, Normal muscle strengthen. Back  is symmetric, no curvature. Skin: Skin color, texture, turgor normal. No rashes or lesions  Lymph nodes: Cervical, supraclavicular, and axillary nodes normal.  Neurologic: CN 2 -12 Normal, Normal symmetric reflexes. Normal coordination and gait  Psych: Alert & Oriented x 3, Mood appear stable.    Assessment:    Annual wellness  medicare exam   Plan:    During the course of the visit the patient was educated and counseled about appropriate screening and preventive services including:   Immunizations are up-to-date  Patient does not want colonoscopy but agrees to Cologuard  Has annual mammogram     Patient Instructions (the written plan) was given to the patient.  Medicare Attestation  I have personally reviewed:  The patient's medical and social history  Their use of alcohol, tobacco or illicit drugs  Their current medications and supplements  The patient's functional ability including ADLs,fall risks, home safety risks, cognitive, and hearing and visual impairment  Diet and physical activities  Evidence for depression or mood disorders  The patient's weight, height, BMI, and visual acuity have been recorded in the chart. I have made referrals, counseling, and provided education to the patient based on review of the above and I have provided the patient with a written personalized care plan for preventive services.

## 2020-01-30 ENCOUNTER — Other Ambulatory Visit: Payer: Self-pay | Admitting: Internal Medicine

## 2020-02-02 ENCOUNTER — Telehealth: Payer: Self-pay | Admitting: Internal Medicine

## 2020-02-02 LAB — HM MAMMOGRAPHY

## 2020-02-02 NOTE — Telephone Encounter (Signed)
Hang Ammon [phone]  Amyrie called to inquire about her refill for Synthroid, she said when she picked it up, she notice it was the generic instead of name brand and she thought that they had decided she does better with name brand. Also she was asking if the referral for Cologaurd had been sent in for her.

## 2020-02-06 ENCOUNTER — Encounter: Payer: Self-pay | Admitting: Internal Medicine

## 2020-02-08 ENCOUNTER — Encounter: Payer: Self-pay | Admitting: Internal Medicine

## 2020-02-08 ENCOUNTER — Other Ambulatory Visit: Payer: Self-pay | Admitting: Internal Medicine

## 2020-02-14 NOTE — Patient Instructions (Signed)
It was a pleasure to see you today.  We are glad you are feeling well.  TSH is low at 0.03.  Decrease levothyroxine to 100 mcg daily and follow-up in December.

## 2020-03-02 ENCOUNTER — Other Ambulatory Visit: Payer: Self-pay

## 2020-03-02 ENCOUNTER — Other Ambulatory Visit: Payer: Medicare Other | Admitting: Internal Medicine

## 2020-03-02 DIAGNOSIS — E039 Hypothyroidism, unspecified: Secondary | ICD-10-CM

## 2020-03-03 LAB — TSH: TSH: 0.06 m[IU]/L — ABNORMAL LOW (ref 0.40–4.50)

## 2020-03-06 ENCOUNTER — Encounter: Payer: Self-pay | Admitting: Internal Medicine

## 2020-03-06 ENCOUNTER — Telehealth: Payer: Self-pay | Admitting: Internal Medicine

## 2020-03-06 ENCOUNTER — Ambulatory Visit (INDEPENDENT_AMBULATORY_CARE_PROVIDER_SITE_OTHER): Payer: Medicare Other | Admitting: Internal Medicine

## 2020-03-06 ENCOUNTER — Other Ambulatory Visit: Payer: Self-pay

## 2020-03-06 VITALS — BP 160/90 | HR 68 | Ht 63.0 in | Wt 150.0 lb

## 2020-03-06 DIAGNOSIS — K121 Other forms of stomatitis: Secondary | ICD-10-CM | POA: Diagnosis not present

## 2020-03-06 DIAGNOSIS — E039 Hypothyroidism, unspecified: Secondary | ICD-10-CM

## 2020-03-06 DIAGNOSIS — R7989 Other specified abnormal findings of blood chemistry: Secondary | ICD-10-CM

## 2020-03-06 DIAGNOSIS — R030 Elevated blood-pressure reading, without diagnosis of hypertension: Secondary | ICD-10-CM | POA: Diagnosis not present

## 2020-03-06 MED ORDER — FLUCONAZOLE 150 MG PO TABS: 150.0000 mg | ORAL_TABLET | Freq: Once | ORAL | 0 refills | Status: AC

## 2020-03-06 MED ORDER — CLOTRIMAZOLE-BETAMETHASONE 1-0.05 % EX CREA: TOPICAL_CREAM | Freq: Two times a day (BID) | CUTANEOUS | Status: AC

## 2020-03-06 MED ORDER — CLOTRIMAZOLE-BETAMETHASONE 1-0.05 % EX CREA: TOPICAL_CREAM | CUTANEOUS | 0 refills | Status: DC

## 2020-03-06 MED ORDER — LEVOTHYROXINE SODIUM 88 MCG PO TABS: 88.0000 ug | ORAL_TABLET | Freq: Every day | ORAL | 0 refills | Status: DC

## 2020-03-06 NOTE — Patient Instructions (Addendum)
Use Lotrisone in corners of mouth twice a day. Diflucan 150 mg tab with one refill for stomatitis. Decrease Synthroid to 0.088 mg daily and follow up with TSH in 6 weeks.

## 2020-03-06 NOTE — Telephone Encounter (Signed)
Called patient, she will go and pick up.

## 2020-03-06 NOTE — Telephone Encounter (Signed)
[name] is calling pharmacy Lotrisone cream was sent. Refill on Diflucan was left off.

## 2020-03-06 NOTE — Telephone Encounter (Signed)
[name] [phone]  Mita called to say she thought she was going to get 2 Diflucan and she only got 1. Then she also said she thought she was going to get some cream for the corner of her mouth.

## 2020-03-06 NOTE — Progress Notes (Deleted)
   Subjective:    Patient ID: [name], female    DOB: 1945-07-17, 74 y.o.   MRN: [id number]  HPI    Review of Systems     Objective:   Physical Exam        Assessment & Plan:

## 2020-03-06 NOTE — Progress Notes (Signed)
   Subjective:    Patient ID: [name], female    DOB: November 22, 1945, 74 y.o.   MRN: [id number]  HPI  74 year old Female for follow up on hypothyroidism. Also, has cheilosis corners of mouth.  History of psoriatic  arthritis being treated with Simponi by Rheumatologist.  Is also on prednisone 7.5 mg daily.  Joint pain under good control.  TSH is low at 0.06.  Ideally should be around 1.00.  Currently on levothyroxine 0.1 mg daily.    Review of Systems complaining of irritation and cracks in the corners of her mouth.  Also complaining of possible yeast infection with irritation in her mouth which she says she has had before.  Blood pressure is elevated today perhaps due to anxiety.     Objective:   Physical Exam Blood pressure 160/90 pulse 68 regular pulse oximetry 96% weight 150 pounds BMI 26.57.  Has gained 3 pounds since November.  Blood pressure was excellent at time of Medicare wellness visit and health maintenance exam November 5.  Suspect she is a bit anxious today. No thyromegaly.  Lesions consistent with cheilosis corners of mouth.  Patient complaining of irritation in mouth.  Says she has had this previously.      Assessment & Plan:  Cheilitis- prescribed Lotrisone cream to use twice daily until lesions on  corners of mouth are healed- this is usually due to a yeast infection.  Hypothyroidism-TSH is low at 0.06 and had been even lower in November of 0.03.  Decrease levothyroxine to 0.88 mg daily and follow-up in approximately 6 weeks.  Psoriatic arthritis treated with Simponi and prednisone by rheumatologist  Stomatitis-could be due to Simponi therapy.  Try Diflucan 150 mg tablet with1 refill provided  Elevated blood pressure reading-monitor at home and call if persistently elevated  30 minutes minutes spent including time with patient plus time spent E scribing medication and medical decision making for several issues today.

## 2020-03-07 DIAGNOSIS — L4050 Arthropathic psoriasis, unspecified: Secondary | ICD-10-CM | POA: Diagnosis not present

## 2020-03-07 DIAGNOSIS — Z79899 Other long term (current) drug therapy: Secondary | ICD-10-CM | POA: Diagnosis not present

## 2020-03-07 DIAGNOSIS — L409 Psoriasis, unspecified: Secondary | ICD-10-CM | POA: Diagnosis not present

## 2020-03-07 DIAGNOSIS — M2550 Pain in unspecified joint: Secondary | ICD-10-CM | POA: Diagnosis not present

## 2020-03-12 ENCOUNTER — Encounter: Payer: Self-pay | Admitting: Cardiology

## 2020-04-03 DIAGNOSIS — L4050 Arthropathic psoriasis, unspecified: Secondary | ICD-10-CM | POA: Diagnosis not present

## 2020-04-16 DIAGNOSIS — L570 Actinic keratosis: Secondary | ICD-10-CM | POA: Diagnosis not present

## 2020-04-16 DIAGNOSIS — D2261 Melanocytic nevi of right upper limb, including shoulder: Secondary | ICD-10-CM | POA: Diagnosis not present

## 2020-04-16 DIAGNOSIS — D2262 Melanocytic nevi of left upper limb, including shoulder: Secondary | ICD-10-CM | POA: Diagnosis not present

## 2020-04-16 DIAGNOSIS — L821 Other seborrheic keratosis: Secondary | ICD-10-CM | POA: Diagnosis not present

## 2020-04-16 DIAGNOSIS — D2372 Other benign neoplasm of skin of left lower limb, including hip: Secondary | ICD-10-CM | POA: Diagnosis not present

## 2020-04-16 DIAGNOSIS — D225 Melanocytic nevi of trunk: Secondary | ICD-10-CM | POA: Diagnosis not present

## 2020-04-16 DIAGNOSIS — L858 Other specified epidermal thickening: Secondary | ICD-10-CM | POA: Diagnosis not present

## 2020-04-16 DIAGNOSIS — D485 Neoplasm of uncertain behavior of skin: Secondary | ICD-10-CM | POA: Diagnosis not present

## 2020-04-17 ENCOUNTER — Other Ambulatory Visit: Payer: Medicare Other | Admitting: Internal Medicine

## 2020-04-17 ENCOUNTER — Ambulatory Visit: Payer: Medicare Other | Admitting: Internal Medicine

## 2020-04-18 ENCOUNTER — Other Ambulatory Visit: Payer: Self-pay | Admitting: Internal Medicine

## 2020-04-19 ENCOUNTER — Other Ambulatory Visit: Payer: Self-pay

## 2020-04-19 ENCOUNTER — Other Ambulatory Visit: Payer: Medicare Other | Admitting: Internal Medicine

## 2020-04-19 DIAGNOSIS — E039 Hypothyroidism, unspecified: Secondary | ICD-10-CM | POA: Diagnosis not present

## 2020-04-20 DIAGNOSIS — D3132 Benign neoplasm of left choroid: Secondary | ICD-10-CM | POA: Diagnosis not present

## 2020-04-20 DIAGNOSIS — H2513 Age-related nuclear cataract, bilateral: Secondary | ICD-10-CM | POA: Diagnosis not present

## 2020-04-20 DIAGNOSIS — H02831 Dermatochalasis of right upper eyelid: Secondary | ICD-10-CM | POA: Diagnosis not present

## 2020-04-20 DIAGNOSIS — H5203 Hypermetropia, bilateral: Secondary | ICD-10-CM | POA: Diagnosis not present

## 2020-04-20 DIAGNOSIS — H52223 Regular astigmatism, bilateral: Secondary | ICD-10-CM | POA: Diagnosis not present

## 2020-04-20 DIAGNOSIS — H43813 Vitreous degeneration, bilateral: Secondary | ICD-10-CM | POA: Diagnosis not present

## 2020-04-20 DIAGNOSIS — H524 Presbyopia: Secondary | ICD-10-CM | POA: Diagnosis not present

## 2020-04-20 DIAGNOSIS — H04123 Dry eye syndrome of bilateral lacrimal glands: Secondary | ICD-10-CM | POA: Diagnosis not present

## 2020-04-20 DIAGNOSIS — H02834 Dermatochalasis of left upper eyelid: Secondary | ICD-10-CM | POA: Diagnosis not present

## 2020-04-20 LAB — TSH: TSH: 0.84 mIU/L (ref 0.40–4.50)

## 2020-05-16 ENCOUNTER — Other Ambulatory Visit: Payer: Self-pay | Admitting: Internal Medicine

## 2020-05-21 DIAGNOSIS — M2550 Pain in unspecified joint: Secondary | ICD-10-CM | POA: Diagnosis not present

## 2020-05-21 DIAGNOSIS — L4050 Arthropathic psoriasis, unspecified: Secondary | ICD-10-CM | POA: Diagnosis not present

## 2020-05-21 DIAGNOSIS — L409 Psoriasis, unspecified: Secondary | ICD-10-CM | POA: Diagnosis not present

## 2020-05-21 DIAGNOSIS — Z79899 Other long term (current) drug therapy: Secondary | ICD-10-CM | POA: Diagnosis not present

## 2020-05-21 DIAGNOSIS — Z6824 Body mass index (BMI) 24.0-24.9, adult: Secondary | ICD-10-CM | POA: Diagnosis not present

## 2020-05-29 ENCOUNTER — Other Ambulatory Visit: Payer: Self-pay | Admitting: Internal Medicine

## 2020-05-29 NOTE — Telephone Encounter (Signed)
Need to book CPE in November before refilling

## 2020-06-07 DIAGNOSIS — L4050 Arthropathic psoriasis, unspecified: Secondary | ICD-10-CM | POA: Diagnosis not present

## 2020-06-11 ENCOUNTER — Ambulatory Visit (INDEPENDENT_AMBULATORY_CARE_PROVIDER_SITE_OTHER): Payer: Medicare Other | Admitting: Internal Medicine

## 2020-06-11 ENCOUNTER — Telehealth: Payer: Self-pay | Admitting: Internal Medicine

## 2020-06-11 ENCOUNTER — Encounter: Payer: Self-pay | Admitting: Internal Medicine

## 2020-06-11 VITALS — HR 78 | Temp 98.2°F

## 2020-06-11 DIAGNOSIS — J0100 Acute maxillary sinusitis, unspecified: Secondary | ICD-10-CM

## 2020-06-11 DIAGNOSIS — L4050 Arthropathic psoriasis, unspecified: Secondary | ICD-10-CM | POA: Diagnosis not present

## 2020-06-11 MED ORDER — HYDROCODONE-HOMATROPINE 5-1.5 MG/5ML PO SYRP: 5.0000 mL | ORAL_SOLUTION | Freq: Three times a day (TID) | ORAL | 0 refills | Status: DC | PRN

## 2020-06-11 MED ORDER — DOXYCYCLINE HYCLATE 100 MG PO TABS: 100.0000 mg | ORAL_TABLET | Freq: Two times a day (BID) | ORAL | 0 refills | Status: DC

## 2020-06-11 NOTE — Telephone Encounter (Signed)
[name] [phone]  [name] called to say she has morning cough, no fever, mucus runny down back of throat, she is also coughing up yellow grey mucus, pressure around eyes, has had all COVID vaccines , she has a home COVID test that she will go ahead a take.

## 2020-06-11 NOTE — Telephone Encounter (Signed)
Home test is negative. Scheduled Car visit

## 2020-06-11 NOTE — Progress Notes (Signed)
   Subjective:    Patient ID: [name], female    DOB: December 26, 1945, 75 y.o.   MRN: [id number]  HPI 75 year old Female with several day history of nasal congestion and coughing up discolored sputum. Hx of Covid-05 April 2019. Did home test today that was negative. Has been symptomatic with respiratory congestion for several days.   Is on Simponi for psoriatic arthritis.  Is very seldom ill with respiratory infections.  Has had 3 Covid vaccines last in October 2021. Has had Pneumococcal 23 and Prevnar vaccines.  Review of Systems no fever or shaking chills. Post nasal congestion.Bringing up discolored sputum/ nasal congestion for several days now. No SOB or wheezing.     Objective:   Physical Exam She is afebrile. Pulse ox 96%  Skin is warm and dry. TMs clear. Not tachypneic.Chest is clear without rales or wheezing.     Assessment & Plan:  Acute Maxillary sinusitis  On DMARD therapy for psoriatic arthritis  Plan:

## 2020-06-11 NOTE — Patient Instructions (Signed)
Doxycycline 100 mg twice daily for 10 days with meal.  Hycodan 1 teaspoon every 8 hours as needed for cough.  Rest and drink fluids.  Call if not better in 10 days or sooner if worse.

## 2020-06-11 NOTE — Telephone Encounter (Signed)
Call us back -if home test is negative, can have  car visit

## 2020-06-15 ENCOUNTER — Ambulatory Visit: Payer: Medicare Other | Admitting: Internal Medicine

## 2020-06-21 ENCOUNTER — Ambulatory Visit: Payer: Medicare Other | Admitting: Internal Medicine

## 2020-07-17 ENCOUNTER — Other Ambulatory Visit: Payer: Self-pay

## 2020-07-17 ENCOUNTER — Ambulatory Visit (INDEPENDENT_AMBULATORY_CARE_PROVIDER_SITE_OTHER): Payer: Medicare Other | Admitting: Internal Medicine

## 2020-07-17 ENCOUNTER — Encounter: Payer: Self-pay | Admitting: Internal Medicine

## 2020-07-17 VITALS — BP 120/80 | HR 90 | Ht 63.0 in | Wt 146.0 lb

## 2020-07-17 DIAGNOSIS — G2581 Restless legs syndrome: Secondary | ICD-10-CM | POA: Diagnosis not present

## 2020-07-17 MED ORDER — ROPINIROLE HCL 0.5 MG PO TABS: ORAL_TABLET | ORAL | 99 refills | Status: DC

## 2020-07-17 NOTE — Progress Notes (Signed)
   Subjective:    Patient ID: [name], female    DOB: 1945-10-15, 75 y.o.   MRN: [id number]  HPI 75 year old Female here today for restless leg syndrome.  History of inflammatory arthropathy seen by [name] and Dr. Trudie Reed feels that this is psoriatic arthritis.  She is treated with Simponi injections every 2 months and takes [place].  History of dependent edema.  Has been treated with Requip for restless leg syndrome but recently symptoms seem to have worsened on current dose of Requip.  She would like to consider increasing the dosage. Currently taking 0.5 mg nightly.  Has tried taking 1 mg (2 tablets) nightly with some relief.   Review of Systems-     Objective:   Physical Exam  Blood pressure 120/80 pulse 90 pulse oximetry 96% weight 146 pounds height 5 feet 3 inches BMI 25.86   Legs are within normal limits with no deformity and normal pulses.  No weakness of the lower extremities.      Assessment & Plan:  Restless leg syndrome  Plan:

## 2020-07-25 DIAGNOSIS — L292 Pruritus vulvae: Secondary | ICD-10-CM | POA: Diagnosis not present

## 2020-08-02 DIAGNOSIS — L4050 Arthropathic psoriasis, unspecified: Secondary | ICD-10-CM | POA: Diagnosis not present

## 2020-08-09 ENCOUNTER — Other Ambulatory Visit: Payer: Self-pay | Admitting: Internal Medicine

## 2020-08-13 NOTE — Patient Instructions (Signed)
May titrate Requip 0.5 mg as follows: 1 or 2  tabs before bedtime as needed for restless leg syndrome

## 2020-08-24 ENCOUNTER — Other Ambulatory Visit: Payer: Self-pay

## 2020-08-24 ENCOUNTER — Other Ambulatory Visit: Payer: Medicare Other | Admitting: Internal Medicine

## 2020-08-24 DIAGNOSIS — E039 Hypothyroidism, unspecified: Secondary | ICD-10-CM | POA: Diagnosis not present

## 2020-08-25 LAB — TSH: TSH: 1.4 mIU/L (ref 0.40–4.50)

## 2020-08-27 ENCOUNTER — Other Ambulatory Visit: Payer: Self-pay

## 2020-08-27 ENCOUNTER — Encounter: Payer: Self-pay | Admitting: Physician Assistant

## 2020-08-27 ENCOUNTER — Ambulatory Visit: Payer: Self-pay

## 2020-08-27 ENCOUNTER — Ambulatory Visit (INDEPENDENT_AMBULATORY_CARE_PROVIDER_SITE_OTHER): Payer: Medicare Other | Admitting: Physician Assistant

## 2020-08-27 ENCOUNTER — Ambulatory Visit: Payer: Medicare Other | Admitting: Physician Assistant

## 2020-08-27 DIAGNOSIS — K130 Diseases of lips: Secondary | ICD-10-CM | POA: Diagnosis not present

## 2020-08-27 DIAGNOSIS — S7001XA Contusion of right hip, initial encounter: Secondary | ICD-10-CM | POA: Diagnosis not present

## 2020-08-27 DIAGNOSIS — R21 Rash and other nonspecific skin eruption: Secondary | ICD-10-CM | POA: Diagnosis not present

## 2020-08-27 DIAGNOSIS — Z96649 Presence of unspecified artificial hip joint: Secondary | ICD-10-CM | POA: Diagnosis not present

## 2020-08-27 NOTE — Progress Notes (Signed)
Office Visit Note   Patient: [name]           Date of Birth: 20-Aug-1945           MRN: [id number] Visit Date: 08/27/2020              Requested by: Elby Showers, MD [address] [place],  [place] [id number] PCP: Elby Showers, MD   Assessment & Plan: Visit Diagnoses:  1. History of hip replacement, total, unspecified laterality   2. Contusion of right hip, initial encounter     Plan: Reassurance is given to the patient that this most likely represents a bony contusion.  Due to the fact that she is on a steroid daily due to her psoriatic arthritis would not recommend a hip injection at this time.  However if her pain persist over 3 months to consider right hip trochanteric injection.  She is shown IT band stretching exercises.  Follow-up with [place] as needed if pain persist or becomes worse.  Follow-Up Instructions: Return if symptoms worsen or fail to improve.   Orders:  Orders Placed This Encounter  Procedures   XR HIP UNILAT W OR W/O PELVIS 2-3 VIEWS RIGHT   No orders of the defined types were placed in this encounter.     Procedures: No procedures performed   Clinical Data: No additional findings.   Subjective: Chief Complaint  Patient presents with   Right Hip - Pain    HPI [name] is a 75 year old female with history of right total hip arthroplasty in 2014 by Dr. Ninfa Linden.  She has done well.  However 4 weeks ago she was playing pickle ball and tripped over her foot and fell on her right side since that time she has had pain lateral aspect of the right hip.  She has had no numbness tingling down the leg.  States pain is just annoying.  She is concerned about possible fracture or injury to the hip replacement.  Since we have last seen her she has been diagnosed with psoriatic arthritis and is treated by Dr. Lenna Gilford.  Review of Systems See HPI  Objective: Vital Signs: There were no vitals taken for this visit.  Physical Exam Constitutional:       Appearance: She is not ill-appearing or diaphoretic.  Neurological:     Mental Status: She is alert and oriented to person, place, and time.  Psychiatric:        Behavior: Behavior normal.    Ortho Exam Right hip tenderness over the trochanteric region.  Otherwise Specialty Comments:  No specialty comments available.  Imaging: XR HIP UNILAT W OR W/O PELVIS 2-3 VIEWS RIGHT  Result Date: 08/27/2020 AP pelvis lateral view of the right hip: Bilateral hips well located.  No acute fractures bony abnormalities.  Status post right total hip arthroplasty with well-seated arthroplasty components.    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   Bilateral tinnitus 01/09/2016   Age-related nuclear cataract of both eyes 09/21/2015   Choroidal nevus, left eye 09/21/2015   Chronic dryness of both eyes 09/21/2015  Dermatochalasis of eyelid 09/21/2015   Presbyopia of both eyes 09/21/2015   PVD (posterior vitreous detachment), both eyes 09/21/2015   Regular astigmatism of both eyes 09/21/2015   Lateral cystocele 07/23/2015   Rectocele 07/23/2015   Osteoarthritis of right hip 05/24/2012   GE reflux 09/23/2011   Hypothyroidism 01/07/2011   Anxiety 01/07/2011   Allergic rhinitis 01/07/2011   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   Past Medical History:  Diagnosis Date   Allergic bronchitis    Allergic bronchitis    Allergy    Arthritis    Claustrophobia    Dependent edema    GERD (gastroesophageal reflux disease)    occas problem -  burning, cough would take prilosec if needed   H/O hiatal hernia    HSV-2 (herpes simplex virus 2) infection    Hypothyroidism    Peripheral neuropathy    3 toes right foot 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 problem   Psoriatic arthritis ([place]) 02/18/2019   Thyroid disease     Family History  Problem Relation Age of Onset   Heart disease Father    Hypertension Sister    Heart disease Brother    Asthma Daughter    Hypertension Sister     Past Surgical History:  Procedure Laterality Date   BACK SURGERY     BREAST BIOPSY  1972   benign   CARPAL TUNNEL RELEASE Left 12/17/2018   Procedure: LEFT CARPAL TUNNEL RELEASE;  Surgeon: Leanora Cover, MD;  Location: [facility];  Service: Orthopedics;  Laterality: Left;   CHOLECYSTECTOMY     LUMBAR LAMINECTOMY  1999   L4   LUMBAR LAMINECTOMY  1981   L5   TOTAL HIP ARTHROPLASTY Right 07/02/2012   Procedure: RIGHT TOTAL HIP ARTHROPLASTY ANTERIOR APPROACH;  Surgeon: Mcarthur Rossetti, MD;  Location: [facility];  Service: Orthopedics;  Laterality: Right;   Social History   Occupational History   Not on file  Tobacco Use   Smoking status: Never   Smokeless tobacco: Never  Vaping Use   Vaping Use: Never used  Substance and Sexual Activity   Alcohol use: Yes    Comment: socially   Drug use: No   Sexual activity: Not on file

## 2020-08-28 ENCOUNTER — Ambulatory Visit: Payer: Medicare Other | Admitting: Internal Medicine

## 2020-09-07 ENCOUNTER — Other Ambulatory Visit: Payer: Self-pay | Admitting: Internal Medicine

## 2020-09-20 DIAGNOSIS — L500 Allergic urticaria: Secondary | ICD-10-CM | POA: Diagnosis not present

## 2020-09-20 DIAGNOSIS — R21 Rash and other nonspecific skin eruption: Secondary | ICD-10-CM | POA: Diagnosis not present

## 2020-09-20 DIAGNOSIS — S90869A Insect bite (nonvenomous), unspecified foot, initial encounter: Secondary | ICD-10-CM | POA: Diagnosis not present

## 2020-10-02 DIAGNOSIS — L4050 Arthropathic psoriasis, unspecified: Secondary | ICD-10-CM | POA: Diagnosis not present

## 2020-10-09 DIAGNOSIS — L259 Unspecified contact dermatitis, unspecified cause: Secondary | ICD-10-CM | POA: Diagnosis not present

## 2020-10-09 DIAGNOSIS — L509 Urticaria, unspecified: Secondary | ICD-10-CM | POA: Diagnosis not present

## 2020-10-24 DIAGNOSIS — S90861A Insect bite (nonvenomous), right foot, initial encounter: Secondary | ICD-10-CM | POA: Diagnosis not present

## 2020-10-24 DIAGNOSIS — S90862A Insect bite (nonvenomous), left foot, initial encounter: Secondary | ICD-10-CM | POA: Diagnosis not present

## 2020-10-24 DIAGNOSIS — R21 Rash and other nonspecific skin eruption: Secondary | ICD-10-CM | POA: Diagnosis not present

## 2020-10-31 ENCOUNTER — Other Ambulatory Visit: Payer: Self-pay | Admitting: Internal Medicine

## 2020-11-12 ENCOUNTER — Encounter: Payer: Self-pay | Admitting: Internal Medicine

## 2020-11-12 DIAGNOSIS — L308 Other specified dermatitis: Secondary | ICD-10-CM | POA: Diagnosis not present

## 2020-11-12 DIAGNOSIS — L280 Lichen simplex chronicus: Secondary | ICD-10-CM | POA: Diagnosis not present

## 2020-11-21 DIAGNOSIS — Z6825 Body mass index (BMI) 25.0-25.9, adult: Secondary | ICD-10-CM | POA: Diagnosis not present

## 2020-11-21 DIAGNOSIS — L4050 Arthropathic psoriasis, unspecified: Secondary | ICD-10-CM | POA: Diagnosis not present

## 2020-11-21 DIAGNOSIS — L409 Psoriasis, unspecified: Secondary | ICD-10-CM | POA: Diagnosis not present

## 2020-11-21 DIAGNOSIS — M2550 Pain in unspecified joint: Secondary | ICD-10-CM | POA: Diagnosis not present

## 2020-11-21 DIAGNOSIS — Z79899 Other long term (current) drug therapy: Secondary | ICD-10-CM | POA: Diagnosis not present

## 2020-11-21 DIAGNOSIS — E663 Overweight: Secondary | ICD-10-CM | POA: Diagnosis not present

## 2020-11-26 ENCOUNTER — Other Ambulatory Visit: Payer: Self-pay | Admitting: Internal Medicine

## 2020-12-03 DIAGNOSIS — R059 Cough, unspecified: Secondary | ICD-10-CM | POA: Diagnosis not present

## 2020-12-03 DIAGNOSIS — Z20822 Contact with and (suspected) exposure to covid-19: Secondary | ICD-10-CM | POA: Diagnosis not present

## 2020-12-03 DIAGNOSIS — R062 Wheezing: Secondary | ICD-10-CM | POA: Diagnosis not present

## 2020-12-03 LAB — POC SOFIA 2 FLU + SARS ANTIGEN FIA
Influenza A, POC: NEGATIVE
Influenza B, POC: NEGATIVE
SARS Coronavirus 2 Ag: NEGATIVE

## 2020-12-10 ENCOUNTER — Telehealth: Payer: Self-pay | Admitting: Internal Medicine

## 2020-12-10 NOTE — Telephone Encounter (Signed)
scheduled

## 2020-12-10 NOTE — Telephone Encounter (Signed)
Called and had patient do a COVID test today and it was negative.

## 2020-12-10 NOTE — Telephone Encounter (Signed)
[name] [phone]  [name] called to say she is at [employer], but will be coming home later today. She would like to get an appointment sometime this week, she has a cough and wheezing that has been going on since 12/03/2020. She went to Urgent Care down there and they told her she has pneumonia, she had reaction to medication. I have sent a fax request to [facility] [phone] trying to get records from visit there, she had called trying to get them and would only send them if we requested them.

## 2020-12-11 ENCOUNTER — Other Ambulatory Visit: Payer: Self-pay

## 2020-12-11 ENCOUNTER — Ambulatory Visit (INDEPENDENT_AMBULATORY_CARE_PROVIDER_SITE_OTHER): Payer: Medicare Other | Admitting: Internal Medicine

## 2020-12-11 ENCOUNTER — Ambulatory Visit
Admission: RE | Admit: 2020-12-11 | Discharge: 2020-12-11 | Disposition: A | Discharge status: Home / Self Care | Payer: Medicare Other | Source: Ambulatory Visit | Attending: Internal Medicine | Admitting: Internal Medicine

## 2020-12-11 VITALS — BP 126/70 | HR 68 | Temp 97.6°F | Resp 16 | Ht 63.0 in | Wt 152.0 lb

## 2020-12-11 DIAGNOSIS — J22 Unspecified acute lower respiratory infection: Secondary | ICD-10-CM

## 2020-12-11 DIAGNOSIS — L4050 Arthropathic psoriasis, unspecified: Secondary | ICD-10-CM

## 2020-12-11 DIAGNOSIS — E039 Hypothyroidism, unspecified: Secondary | ICD-10-CM | POA: Diagnosis not present

## 2020-12-11 DIAGNOSIS — R059 Cough, unspecified: Secondary | ICD-10-CM

## 2020-12-11 DIAGNOSIS — G2581 Restless legs syndrome: Secondary | ICD-10-CM

## 2020-12-11 MED ORDER — AZITHROMYCIN 250 MG PO TABS: ORAL_TABLET | ORAL | 0 refills | Status: AC

## 2020-12-11 NOTE — Progress Notes (Addendum)
Called to let patient know chest Xray is negative, she verbalized understanding   I called patient September 28th. Awaiting test results sent to [facility]. She does not feel well. Have sent in [place]. CXR negative. Have offered steroids but she would like to wait on those. MJB,MD

## 2020-12-11 NOTE — Progress Notes (Signed)
   Subjective:    Patient ID: [name], female    DOB: 01/16/1946, 75 y.o.   MRN: [id number]  HPI  Patient developed cough some 4 weeks ago that has persisted. Was seen at Urgent Care at [facility] recently on September 19 and was felt to possibly have pneumonia.  Chest x-ray showed possible left lower lobe infiltrate.  Flu and COVID tests were negative.  Was treated with ProAir inhaler, doxycycline for 10 days and prednisone 20 mg 2 tablets by mouth daily for 5 days.  Patient is still coughing.  She and her husband have a home in [place] and have been staying there recently. Has returned home to [facility] but is scheduled to travel to [facility] for a 4 day event on Thursday.Records indicate 3 Covid vaccines. Has tested negative for Covid at home.  She has a history of psoriatic arthritis and is followed by Dr. Gavin Pound treated with Simponi and Jolee Ewing.  History of restless leg syndrome treated with Requip.  History of dependent edema treated with torsemide.  She takes Wellbutrin.  History of hypothyroidism.    Review of Systems no fever or shaking chills.  Has malaise and fatigue and persistent cough that is annoying.  Also says she was stung by fire ants recently on her legs and has been treated by Dr. Fontaine No at [facility].     Objective:   Physical Exam  BP 126/70 pulse 68 RR 16 T 97.6 degrees pulse ox 97%  Skin warm and dry.  TMs clear.  Neck is supple.  Chest: Coarse breath sounds in the lower lobes bilaterally.  No lower extremity edema.  She is anxious.  Not tachypneic.     Assessment & Plan:  Acute lower respiratory infection-possible pneumonia-rule out COVID-19 virus infection.  Respiratory virus panel and COVID-19 PCR test obtained today along with CBC with differential.  Addendum: Chest x-ray is negative for pneumonia.  Other tests are pending including COVID-19 PCR and respiratory virus panel. Z-pak prescribed. Ceftriaxone one gram given in  office.  History of Psoriatic arthritis treated with Simponi and [place] by Dr. Trudie Reed.  Used to take low-dose prednisone but is tapered off of that completely.  History of fire ant bites lower extremities currently being seen by dermatologist  Hypothyroidism-on thyroid replacement medication  Restless leg syndrome-treated with Requip  Plan: 1 g IM Rocephin given in office today.  Recommend Zithromax Z-PAK 2 tabs day 1 followed by 1 tab days 2 through 5.  May need to be on another round of prednisone.  Okay to use inhaler. Await test results.

## 2020-12-11 NOTE — Patient Instructions (Signed)
1 g IM Rocephin given in office today.  Respiratory virus panel and COVID-19 PCR test are pending.  Chest x-ray shows no pneumonia.  Recommend Zithromax Z-PAK 2 tablets day 1 followed by 1 tab days 2 through 5.  May need prednisone taper if still coughing after Zithromax course.

## 2020-12-12 ENCOUNTER — Encounter: Payer: Self-pay | Admitting: Internal Medicine

## 2020-12-13 ENCOUNTER — Telehealth: Payer: Self-pay | Admitting: Internal Medicine

## 2020-12-13 DIAGNOSIS — R059 Cough, unspecified: Secondary | ICD-10-CM | POA: Diagnosis not present

## 2020-12-13 DIAGNOSIS — J22 Unspecified acute lower respiratory infection: Secondary | ICD-10-CM | POA: Diagnosis not present

## 2020-12-13 LAB — RESPIRATORY VIRUS PANEL

## 2020-12-13 LAB — TIQ-NTM

## 2020-12-13 LAB — SARS-COV-2 RNA,(COVID-19) QUALITATIVE NAAT: SARS CoV2 RNA: NOT DETECTED

## 2020-12-13 LAB — CBC WITH DIFFERENTIAL/PLATELET

## 2020-12-13 MED ORDER — CEFTRIAXONE SODIUM 1 G IJ SOLR
1.0000 g | Freq: Once | INTRAMUSCULAR | Status: AC
  Administered 2020-12-13: 1 g via INTRAMUSCULAR

## 2020-12-13 NOTE — Addendum Note (Signed)
Addended by: Patrcia Dolly on: 12/13/2020 09:03 AM   Modules accepted: Orders

## 2020-12-13 NOTE — Telephone Encounter (Signed)
Called to let patient know she was negative for COVID, she stated that cough is better. She will call us back if she needs [place].

## 2020-12-13 NOTE — Telephone Encounter (Signed)
-----   Message from Thomes Cake, [place] sent at 12/13/2020 10:40 AM EDT ----- This should go the CMA in the office. Not sure who I should send this too.

## 2020-12-18 DIAGNOSIS — L301 Dyshidrosis [pompholyx]: Secondary | ICD-10-CM | POA: Diagnosis not present

## 2020-12-18 DIAGNOSIS — D692 Other nonthrombocytopenic purpura: Secondary | ICD-10-CM | POA: Diagnosis not present

## 2020-12-31 DIAGNOSIS — Z23 Encounter for immunization: Secondary | ICD-10-CM | POA: Diagnosis not present

## 2021-01-25 ENCOUNTER — Other Ambulatory Visit: Payer: Self-pay

## 2021-01-25 ENCOUNTER — Other Ambulatory Visit: Payer: Medicare Other | Admitting: Internal Medicine

## 2021-01-25 DIAGNOSIS — R030 Elevated blood-pressure reading, without diagnosis of hypertension: Secondary | ICD-10-CM | POA: Diagnosis not present

## 2021-01-25 DIAGNOSIS — E039 Hypothyroidism, unspecified: Secondary | ICD-10-CM

## 2021-01-25 DIAGNOSIS — Z0000 Encounter for general adult medical examination without abnormal findings: Secondary | ICD-10-CM | POA: Diagnosis not present

## 2021-01-26 LAB — COMPLETE METABOLIC PANEL WITH GFR
AG Ratio: 2 (calc) (ref 1.0–2.5)
ALT: 24 U/L (ref 6–29)
AST: 24 U/L (ref 10–35)
Albumin: 4.3 g/dL (ref 3.6–5.1)
Alkaline phosphatase (APISO): 88 U/L (ref 37–153)
BUN: 11 mg/dL (ref 7–25)
CO2: 34 mmol/L — ABNORMAL HIGH (ref 20–32)
Calcium: 9.3 mg/dL (ref 8.6–10.4)
Chloride: 101 mmol/L (ref 98–110)
Creat: 0.73 mg/dL (ref 0.60–1.00)
Globulin: 2.1 g/dL (calc) (ref 1.9–3.7)
Glucose, Bld: 85 mg/dL (ref 65–99)
Potassium: 4.1 mmol/L (ref 3.5–5.3)
Sodium: 145 mmol/L (ref 135–146)
Total Bilirubin: 0.7 mg/dL (ref 0.2–1.2)
Total Protein: 6.4 g/dL (ref 6.1–8.1)
eGFR: 85 mL/min/{1.73_m2} (ref >=60)

## 2021-01-26 LAB — LIPID PANEL
Cholesterol: 240 mg/dL — ABNORMAL HIGH (ref <200)
HDL: 89 mg/dL (ref >=50)
LDL Cholesterol (Calc): 124 mg/dL (calc) — ABNORMAL HIGH
Non-HDL Cholesterol (Calc): 151 mg/dL (calc) — ABNORMAL HIGH (ref <130)
Total CHOL/HDL Ratio: 2.7 (calc) (ref <5.0)
Triglycerides: 158 mg/dL — ABNORMAL HIGH (ref <150)

## 2021-01-26 LAB — TSH: TSH: 0.96 mIU/L (ref 0.40–4.50)

## 2021-01-26 LAB — CBC WITH DIFFERENTIAL/PLATELET
Absolute Monocytes: 685 cells/uL (ref 200–950)
Basophils Absolute: 92 cells/uL (ref 0–200)
Basophils Relative: 2 %
Eosinophils Absolute: 423 cells/uL (ref 15–500)
Eosinophils Relative: 9.2 %
HCT: 45.2 % — ABNORMAL HIGH (ref 35.0–45.0)
Hemoglobin: 15.4 g/dL (ref 11.7–15.5)
Lymphs Abs: 1573 cells/uL (ref 850–3900)
MCH: 31.2 pg (ref 27.0–33.0)
MCHC: 34.1 g/dL (ref 32.0–36.0)
MCV: 91.5 fL (ref 80.0–100.0)
MPV: 8.6 fL (ref 7.5–12.5)
Monocytes Relative: 14.9 %
Neutro Abs: 1826 cells/uL (ref 1500–7800)
Neutrophils Relative %: 39.7 %
Platelets: 323 10*3/uL (ref 140–400)
RBC: 4.94 10*6/uL (ref 3.80–5.10)
RDW: 12 % (ref 11.0–15.0)
Total Lymphocyte: 34.2 %
WBC: 4.6 10*3/uL (ref 3.8–10.8)

## 2021-01-28 ENCOUNTER — Other Ambulatory Visit: Payer: Medicare Other | Admitting: Internal Medicine

## 2021-01-31 ENCOUNTER — Other Ambulatory Visit: Payer: Self-pay

## 2021-01-31 ENCOUNTER — Ambulatory Visit (INDEPENDENT_AMBULATORY_CARE_PROVIDER_SITE_OTHER): Payer: Medicare Other | Admitting: Internal Medicine

## 2021-01-31 ENCOUNTER — Encounter: Payer: Self-pay | Admitting: Internal Medicine

## 2021-01-31 VITALS — BP 128/88 | HR 68 | Temp 97.7°F | Ht 63.75 in | Wt 148.0 lb

## 2021-01-31 DIAGNOSIS — L4050 Arthropathic psoriasis, unspecified: Secondary | ICD-10-CM | POA: Diagnosis not present

## 2021-01-31 DIAGNOSIS — Z1211 Encounter for screening for malignant neoplasm of colon: Secondary | ICD-10-CM

## 2021-01-31 DIAGNOSIS — E039 Hypothyroidism, unspecified: Secondary | ICD-10-CM | POA: Diagnosis not present

## 2021-01-31 DIAGNOSIS — Z96641 Presence of right artificial hip joint: Secondary | ICD-10-CM

## 2021-01-31 DIAGNOSIS — G2581 Restless legs syndrome: Secondary | ICD-10-CM | POA: Diagnosis not present

## 2021-01-31 DIAGNOSIS — Z79899 Other long term (current) drug therapy: Secondary | ICD-10-CM

## 2021-01-31 DIAGNOSIS — F32A Depression, unspecified: Secondary | ICD-10-CM | POA: Diagnosis not present

## 2021-01-31 DIAGNOSIS — Z0000 Encounter for general adult medical examination without abnormal findings: Secondary | ICD-10-CM

## 2021-01-31 DIAGNOSIS — R609 Edema, unspecified: Secondary | ICD-10-CM | POA: Diagnosis not present

## 2021-01-31 DIAGNOSIS — Z5181 Encounter for therapeutic drug level monitoring: Secondary | ICD-10-CM | POA: Diagnosis not present

## 2021-01-31 DIAGNOSIS — M1990 Unspecified osteoarthritis, unspecified site: Secondary | ICD-10-CM

## 2021-01-31 DIAGNOSIS — Z8619 Personal history of other infectious and parasitic diseases: Secondary | ICD-10-CM

## 2021-01-31 DIAGNOSIS — Z1213 Encounter for screening for malignant neoplasm of small intestine: Secondary | ICD-10-CM | POA: Diagnosis not present

## 2021-01-31 DIAGNOSIS — F419 Anxiety disorder, unspecified: Secondary | ICD-10-CM | POA: Diagnosis not present

## 2021-01-31 LAB — POCT URINALYSIS DIPSTICK
Bilirubin, UA: NEGATIVE
Blood, UA: NEGATIVE
Glucose, UA: NEGATIVE
Ketones, UA: NEGATIVE
Leukocytes, UA: NEGATIVE
Nitrite, UA: NEGATIVE
Protein, UA: NEGATIVE
Spec Grav, UA: 1.015 (ref 1.010–1.025)
Urobilinogen, UA: 0.2 E.U./dL
pH, UA: 6.5 (ref 5.0–8.0)

## 2021-01-31 MED ORDER — ROPINIROLE HCL 0.5 MG PO TABS: ORAL_TABLET | ORAL | 1 refills | Status: DC

## 2021-01-31 NOTE — Progress Notes (Signed)
Annual Wellness Visit     Patient: [name], Female    DOB: 1945/12/10, 75 y.o.   MRN: [id number] Visit Date 01/31/2021 Subjective    [name] is a 75 y.o. female who presents today for her Annual Wellness Visit.  HPI She is also here for health  maintenance exam and evaluation of medical issues.  Diagnosed by Rheumatologist in [place], Dr. Trudie Reed with inflammatory arthropathy and psoriatic arthritis.  Was receiving Simponi injections every 2 months but this has been held recently with issues she was having after being stung by fire ants however she will be getting an injection today at Rheumatology office. Issues started with dependent edema.  2020.  She also saw Dr. Harrell Gave, cardiologist and was treated with Lasix and Demadex.  Compression stockings were recommended by vascular surgeon for chronic venous insufficiency but she was not thought to have peripheral vascular disease.  Compression stockings did not seem to help.  She was found to have an elevated eosinophil count at  and this has always been normal over the previous years.  Patient noted edema got better with steroids.  Her sed rate was 11.  Currently total cholesterol is 240. LDL is 124. Triglycerides 158. Has had difficult Summer after being bitten by fire ants.  She also was seen in [facility].  She has a history of restless leg syndrome.  Status post right hip arthroplasty by Dr. Ninfa Linden in 2014.  She has a healthcare power of attorney and has provided [place] the documents.  They are in epic.  She is allergic to penicillin-it causes anaphylaxis.  History of GE reflux, allergic rhinitis, neuropathy of right foot and anxiety.  Benign left breast biopsy 1972, lumbar disc surgery L4 1999.  Lumbar disc surgery at L5 1991.  Cholecystectomy 2004.  Colonoscopy initially done in 2007 with 10-year follow-up recommended.  Social history: Her husband is a retired Control and instrumentation engineer and  is a Ambulance person [employer].  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  She does not smoke.  Social alcohol consumption.  She lost a stepson to suicide in 2018.  She has 2 adult daughters from a previous marriage.  Family history: 1 brother died at age 84 of congestive heart failure secondary to rheumatic fever complications.  1 sister with hypertension and obesity.  Another sister with hypertension and obesity.  1 brother living.  Mother deceased with history of neurological problems and heart disease.  History of anxiety and depression treated with alprazolam and Wellbutrin.  History of dependent edema treated with Demadex.  History of hypothyroidism treated with thyroid replacement medication.  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  History of restless leg syndrome treated with Requip.  Had Colonoscopy in 2007.  Does not want to have colonoscopy at this point in time due to issues she has had recently.  I do not think Cologuard which was discussed at visit in November 2021 was sent to her time.  This will be reordered.  Has had flu vaccine and wants to hold off on Covid booster after issues she has experienced after being bitten by fire ants.  Getting mammogram and GYN exam next week.  Patient Care Team: Elby Showers, MD as PCP - General (Internal Medicine) Buford Dresser, MD as PCP - Cardiology (Cardiology)  Review of Systems see above-beginning to feel better with less itching after fire ants  bit her.   Objective    Vitals: BP 128/88   Pulse 68   Temp 97.7 F (36.5 C) (Tympanic)   Ht 5' 3.75" (1.619 m)   Wt 148 lb (67.1 kg)   SpO2 99%   BMI 25.60 kg/m   Physical Exam  Skin: Warm and dry.  Nodes none.  TMs clear.  Neck supple.  No thyromegaly.  No carotid bruits.  No adenopathy in the cervical area.  Chest is clear to auscultation.  Cardiac exam: Regular rate and rhythm without ectopy or murmurs.   Abdomen soft nondistended without hepatosplenomegaly masses or tenderness.  No rash.  No lower extremity edema.  Neurological exam is intact without gross focal deficits.  Affect thought and judgment are normal.   Most recent functional status assessment: In your present state of health, do you have any difficulty performing the following activities: 01/31/2021  Hearing? N  Vision? N  Difficulty concentrating or making decisions? N  Walking or climbing stairs? N  Dressing or bathing? N  Doing errands, shopping? N  Preparing Food and eating ? N  Using the Toilet? N  In the past six months, have you accidently leaked urine? N  Do you have problems with loss of bowel control? N  Managing your Medications? N  Managing your Finances? N  Housekeeping or managing your Housekeeping? N  Some recent data might be hidden   Most recent fall risk assessment: Fall Risk  01/31/2021  Falls in the past year? 0  Comment -  Number falls in past yr: 0  Injury with Fall? 0  Risk for fall due to : No Fall Risks  Follow up Falls evaluation completed    Most recent depression screenings: PHQ 2/9 Scores 01/31/2021 01/20/2020  PHQ - 2 Score 0 0  PHQ- 9 Score - -   Most recent cognitive screening: 6CIT Screen 01/31/2021  What Year? 0 points  What month? 0 points  What time? 0 points  Count back from 20 0 points  Months in reverse 2 points  Repeat phrase 10 points  Total Score 12       Assessment & Plan     Annual wellness visit done today including the all of the following: Reviewed patient's Family Medical History Reviewed and updated list of patient's medical providers Assessment of cognitive impairment was done Assessed patient's functional ability Established a written schedule for health screening [place] Completed and Reviewed  Discussed health benefits of physical activity, and encouraged her to engage in regular exercise appropriate for her age and  condition.    Impression: Inflammatory arthritis treated with Simponi but has been healed recently after having significant reaction to fire ant bites.  Is getting Simponi injection today by Rheumatologist.    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  Symptoms are stable.  History of left carpal tunnel release  History of right carpal tunnel syndrome  History of right hip arthroplasty 2014  History of COVID-19-recovered  Dependent edema treated with diuretic  Plan: Currently she is doing much better with regard to dermatitis from fire ant stings.  She may return in 6 months or as needed.  Continue close follow-up with Rheumatologist.  Order Cologuard.     IElby Showers, MD, have reviewed all documentation for this visit. The documentation on  02/03/21 for the exam, diagnosis, procedures, and orders are all accurate and complete.   Angus Seller, CMA

## 2021-01-31 NOTE — Patient Instructions (Addendum)
Continue to watch diet and exercise. RTC in 6 months. Requip refilled to [facility].  No change in meds. It was a pleasure to see you today.

## 2021-02-22 DIAGNOSIS — B353 Tinea pedis: Secondary | ICD-10-CM | POA: Diagnosis not present

## 2021-02-22 DIAGNOSIS — L309 Dermatitis, unspecified: Secondary | ICD-10-CM | POA: Diagnosis not present

## 2021-02-27 DIAGNOSIS — Z1211 Encounter for screening for malignant neoplasm of colon: Secondary | ICD-10-CM | POA: Diagnosis not present

## 2021-03-08 LAB — COLOGUARD: COLOGUARD: NEGATIVE

## 2021-03-20 DIAGNOSIS — Z1231 Encounter for screening mammogram for malignant neoplasm of breast: Secondary | ICD-10-CM | POA: Diagnosis not present

## 2021-03-22 ENCOUNTER — Other Ambulatory Visit: Payer: Self-pay | Admitting: Internal Medicine

## 2021-03-28 DIAGNOSIS — Z79899 Other long term (current) drug therapy: Secondary | ICD-10-CM | POA: Diagnosis not present

## 2021-03-28 DIAGNOSIS — L4050 Arthropathic psoriasis, unspecified: Secondary | ICD-10-CM | POA: Diagnosis not present

## 2021-04-11 DIAGNOSIS — Z91013 Allergy to seafood: Secondary | ICD-10-CM | POA: Diagnosis not present

## 2021-04-11 DIAGNOSIS — J300 Vasomotor rhinitis: Secondary | ICD-10-CM | POA: Diagnosis not present

## 2021-04-11 DIAGNOSIS — T63421D Toxic effect of venom of ants, accidental (unintentional), subsequent encounter: Secondary | ICD-10-CM | POA: Diagnosis not present

## 2021-04-11 DIAGNOSIS — T63421A Toxic effect of venom of ants, accidental (unintentional), initial encounter: Secondary | ICD-10-CM | POA: Diagnosis not present

## 2021-04-11 DIAGNOSIS — L309 Dermatitis, unspecified: Secondary | ICD-10-CM | POA: Diagnosis not present

## 2021-04-17 DIAGNOSIS — N8112 Cystocele, lateral: Secondary | ICD-10-CM | POA: Diagnosis not present

## 2021-04-17 DIAGNOSIS — N952 Postmenopausal atrophic vaginitis: Secondary | ICD-10-CM | POA: Diagnosis not present

## 2021-04-17 DIAGNOSIS — N816 Rectocele: Secondary | ICD-10-CM | POA: Diagnosis not present

## 2021-04-17 DIAGNOSIS — Z01419 Encounter for gynecological examination (general) (routine) without abnormal findings: Secondary | ICD-10-CM | POA: Diagnosis not present

## 2021-04-17 DIAGNOSIS — B009 Herpesviral infection, unspecified: Secondary | ICD-10-CM | POA: Diagnosis not present

## 2021-04-23 ENCOUNTER — Other Ambulatory Visit: Payer: Self-pay | Admitting: Internal Medicine

## 2021-05-06 DIAGNOSIS — J300 Vasomotor rhinitis: Secondary | ICD-10-CM | POA: Diagnosis not present

## 2021-05-06 DIAGNOSIS — L309 Dermatitis, unspecified: Secondary | ICD-10-CM | POA: Diagnosis not present

## 2021-05-06 DIAGNOSIS — Z91013 Allergy to seafood: Secondary | ICD-10-CM | POA: Diagnosis not present

## 2021-05-06 DIAGNOSIS — T63421D Toxic effect of venom of ants, accidental (unintentional), subsequent encounter: Secondary | ICD-10-CM | POA: Diagnosis not present

## 2021-05-08 DIAGNOSIS — T63421A Toxic effect of venom of ants, accidental (unintentional), initial encounter: Secondary | ICD-10-CM | POA: Diagnosis not present

## 2021-05-08 DIAGNOSIS — L309 Dermatitis, unspecified: Secondary | ICD-10-CM | POA: Diagnosis not present

## 2021-05-08 DIAGNOSIS — J300 Vasomotor rhinitis: Secondary | ICD-10-CM | POA: Diagnosis not present

## 2021-05-08 DIAGNOSIS — Z91013 Allergy to seafood: Secondary | ICD-10-CM | POA: Diagnosis not present

## 2021-05-22 DIAGNOSIS — Z6825 Body mass index (BMI) 25.0-25.9, adult: Secondary | ICD-10-CM | POA: Diagnosis not present

## 2021-05-22 DIAGNOSIS — L409 Psoriasis, unspecified: Secondary | ICD-10-CM | POA: Diagnosis not present

## 2021-05-22 DIAGNOSIS — Z79899 Other long term (current) drug therapy: Secondary | ICD-10-CM | POA: Diagnosis not present

## 2021-05-22 DIAGNOSIS — M2550 Pain in unspecified joint: Secondary | ICD-10-CM | POA: Diagnosis not present

## 2021-05-22 DIAGNOSIS — L4050 Arthropathic psoriasis, unspecified: Secondary | ICD-10-CM | POA: Diagnosis not present

## 2021-05-22 DIAGNOSIS — R21 Rash and other nonspecific skin eruption: Secondary | ICD-10-CM | POA: Diagnosis not present

## 2021-05-22 DIAGNOSIS — E663 Overweight: Secondary | ICD-10-CM | POA: Diagnosis not present

## 2021-06-04 DIAGNOSIS — J300 Vasomotor rhinitis: Secondary | ICD-10-CM | POA: Diagnosis not present

## 2021-06-04 DIAGNOSIS — Z91013 Allergy to seafood: Secondary | ICD-10-CM | POA: Diagnosis not present

## 2021-06-04 DIAGNOSIS — L309 Dermatitis, unspecified: Secondary | ICD-10-CM | POA: Diagnosis not present

## 2021-06-04 DIAGNOSIS — T63421D Toxic effect of venom of ants, accidental (unintentional), subsequent encounter: Secondary | ICD-10-CM | POA: Diagnosis not present

## 2021-06-12 DIAGNOSIS — L239 Allergic contact dermatitis, unspecified cause: Secondary | ICD-10-CM | POA: Diagnosis not present

## 2021-06-12 DIAGNOSIS — K130 Diseases of lips: Secondary | ICD-10-CM | POA: Diagnosis not present

## 2021-07-01 ENCOUNTER — Other Ambulatory Visit: Payer: Self-pay | Admitting: Internal Medicine

## 2021-07-30 ENCOUNTER — Other Ambulatory Visit: Payer: Medicare Other

## 2021-07-30 DIAGNOSIS — E785 Hyperlipidemia, unspecified: Secondary | ICD-10-CM | POA: Diagnosis not present

## 2021-07-31 LAB — LIPID PANEL
Cholesterol: 227 mg/dL — ABNORMAL HIGH (ref <200)
HDL: 92 mg/dL (ref >=50)
LDL Cholesterol (Calc): 113 mg/dL (calc) — ABNORMAL HIGH
Non-HDL Cholesterol (Calc): 135 mg/dL (calc) — ABNORMAL HIGH (ref <130)
Total CHOL/HDL Ratio: 2.5 (calc) (ref <5.0)
Triglycerides: 109 mg/dL (ref <150)

## 2021-08-01 ENCOUNTER — Ambulatory Visit: Payer: Medicare Other | Admitting: Internal Medicine

## 2021-08-02 ENCOUNTER — Ambulatory Visit (INDEPENDENT_AMBULATORY_CARE_PROVIDER_SITE_OTHER): Payer: Medicare Other | Admitting: Internal Medicine

## 2021-08-02 ENCOUNTER — Encounter: Payer: Self-pay | Admitting: Internal Medicine

## 2021-08-02 VITALS — BP 124/62 | HR 64 | Temp 97.7°F | Ht 63.75 in | Wt 140.2 lb

## 2021-08-02 DIAGNOSIS — Z87898 Personal history of other specified conditions: Secondary | ICD-10-CM | POA: Diagnosis not present

## 2021-08-02 DIAGNOSIS — M1990 Unspecified osteoarthritis, unspecified site: Secondary | ICD-10-CM

## 2021-08-02 DIAGNOSIS — E039 Hypothyroidism, unspecified: Secondary | ICD-10-CM | POA: Diagnosis not present

## 2021-08-02 DIAGNOSIS — Z136 Encounter for screening for cardiovascular disorders: Secondary | ICD-10-CM | POA: Diagnosis not present

## 2021-08-02 DIAGNOSIS — E7800 Pure hypercholesterolemia, unspecified: Secondary | ICD-10-CM

## 2021-08-02 DIAGNOSIS — Z8249 Family history of ischemic heart disease and other diseases of the circulatory system: Secondary | ICD-10-CM | POA: Diagnosis not present

## 2021-08-02 NOTE — Patient Instructions (Addendum)
Have ordered coronary calcium score. Has family hx of heart disease in father and is statin intolerant. RTC in November for CPE and Medicare wellness visit.

## 2021-08-02 NOTE — Progress Notes (Signed)
   Subjective:    Patient ID: [name], female    DOB: [birth year], 76 y.o.   MRN: [id number]  HPI 76 year old Female seen for 6 month follow up.  Recently Dr. Trudie Reed and has discontinued Simponi and [place]. Hx of arthropathic psoriasis.  Patient felt like these drugs were causing her GI issues. Had diarrhea in the middle of the night at times. This has resolved.  Also, had allergy testing with Dr. Orvil Feil. Playing pickleball and enjoying it.  Due to not feeling well and having musculoskeletal issues and dermatitis, she discontinued lipid-lowering medication.  Total cholesterol  on may 16th was 227, HDL 92, triglycerides 109, LDL cholesterol 113.  She has family history of heart disease in her father.  She is agreeable to coronary calcium screening.Does not want to be on statin medication.  Has allergy to seafood per Dr. Orvil Feil.  Dr. Trudie Reed has diagnosed her with inflammatory arthropathy and psoriatic arthritis.  She tried Simponi.    She was having issues having been stung by fire ants including dependent edema.  She saw Dr. Harrell Gave who treated her with Lasix and Demadex.  She tried compression stockings as well but that did not seem to help.  Her edema got better with steroids.    History of hypothyroidism treated with thyroid replacement medication.  Had Cologuard testing in December 2022 that was negative.    Review of Systems says she feels much better now and looking forward to a good summer.     Objective:   Physical Exam  BP 124/62, pulse 64 and regular, Temp 97.7 degrees, weight 140 pounds 4 oz, BMI 24.26, pulse ox 99%  Skin: warm and dry. Nodes: none.  No thyromegaly.  No carotid bruits.  Chest clear.  Cardiac exam: Regular rate and rhythm without ectopy.  No lower extremity pitting edema.        Assessment & Plan:  History of inflammatory arthritis-currently not on medication and doing well  Hypothyroidism-treated with levothyroxine 0.088 mg daily  History of  restless leg syndrome treated with Requip  History of insomnia treated with Xanax as needed  Dependent edema-resolved.  Previously treated with Demadex  Pure hypercholesterolemia-total cholesterol 227 and was 240 in November 2022.  LDL has decreased from November when it was 124 and it is now 66.  Triglycerides have decreased from 158 to 109.    HDL is excellent at 92.  She does not want to be on statin medication.  She agrees to coronary calcium scoring.  There is family history of heart disease.  Plan: Have ordered coronary calcium scoring.  Patient is statin intolerant.  Has family history of heart disease in her father.  Return in November for complete health maintenance exam and Medicare wellness visit.  Continue thyroid replacement medication and treatment for restless leg syndrome with Requip.

## 2021-08-15 DIAGNOSIS — L239 Allergic contact dermatitis, unspecified cause: Secondary | ICD-10-CM | POA: Diagnosis not present

## 2021-08-15 DIAGNOSIS — I872 Venous insufficiency (chronic) (peripheral): Secondary | ICD-10-CM | POA: Diagnosis not present

## 2021-09-23 ENCOUNTER — Other Ambulatory Visit: Payer: Self-pay | Admitting: Internal Medicine

## 2021-10-03 NOTE — Progress Notes (Signed)
Patient spoke with Mariann Laster through [place] and was given the number to call and schedule.

## 2021-10-07 DIAGNOSIS — L409 Psoriasis, unspecified: Secondary | ICD-10-CM | POA: Diagnosis not present

## 2021-10-07 DIAGNOSIS — Z6824 Body mass index (BMI) 24.0-24.9, adult: Secondary | ICD-10-CM | POA: Diagnosis not present

## 2021-10-07 DIAGNOSIS — Z79899 Other long term (current) drug therapy: Secondary | ICD-10-CM | POA: Diagnosis not present

## 2021-10-07 DIAGNOSIS — L4050 Arthropathic psoriasis, unspecified: Secondary | ICD-10-CM | POA: Diagnosis not present

## 2021-11-01 ENCOUNTER — Encounter: Payer: Self-pay | Admitting: Internal Medicine

## 2021-11-28 ENCOUNTER — Encounter (HOSPITAL_BASED_OUTPATIENT_CLINIC_OR_DEPARTMENT_OTHER): Payer: Self-pay | Admitting: Cardiology

## 2021-11-28 ENCOUNTER — Ambulatory Visit (INDEPENDENT_AMBULATORY_CARE_PROVIDER_SITE_OTHER): Payer: Medicare Other | Admitting: Cardiology

## 2021-11-28 VITALS — BP 124/74 | HR 66 | Ht 63.0 in | Wt 140.6 lb

## 2021-11-28 DIAGNOSIS — L4050 Arthropathic psoriasis, unspecified: Secondary | ICD-10-CM | POA: Diagnosis not present

## 2021-11-28 DIAGNOSIS — I872 Venous insufficiency (chronic) (peripheral): Secondary | ICD-10-CM

## 2021-11-28 DIAGNOSIS — Z7189 Other specified counseling: Secondary | ICD-10-CM

## 2021-11-28 NOTE — Progress Notes (Signed)
Cardiology Office Note:    Date:  11/28/2021   ID:  [name], DOB [birth year], MRN [id number]  PCP:  Elby Showers, MD  Cardiologist:  Buford Dresser, MD PhD  Referring MD: Elby Showers, MD   CC: follow up  History of Present Illness:    [name] is a 76 y.o. female with a hx of hypothyroidism, restless leg, arthritis who is seen in follow up today. She was initially seen 10/15/18 as a new consult at the request of Baxley, Cresenciano Lick, MD for the evaluation and management of edema.  Edema history:  She reports that lower extremity edema came on very suddenly in the middle of March 2020. Had URI symptoms at the time but did not get tested for Covid. Went on steroids for her shoulders and all the swelling went away for those 6 days. Felt like a new person. Swelling returned when she stopped the steroids, but again swelling decreased after she had joint injections in her shoulders. She tried lasix initially, which did not help. Tried torsemide, first 20 mg then 40 mg. No change with 20 mg, but lost about 6 lbs on 40 mg torsemide. Usual weight 140-144 lbs.   Workup thus far: DVT scan negative, BNP 30, TSH 5.56, Cr/Na normal. K low, treated with supplementation. ESR normal 07/2018. Arterial LE ultrasounds unremarkable, but venous LE ultrasounds with significant venous insufficiency. Echo with EF 60-65%, grade 1 DD, no significant valve disease, couldn't assess R sided pressures.   At her last visit 01/10/2020 she reported feeling much better after seeing rheumatology (Dr. Trudie Reed). Joints were feeling so much better, swelling had gone. She gets Simponi infusion every two months. Also on arava, dose was recently reduced due to blood work. She was down to 5 mg prednisone, slowly tapering off. Did not tolerate methotrexate.  Had seen me, vascular surgery, and hematology before seeing rheumatology. Dr. Fredna Dow, her hand surgeon, was able to get her into rheumatology, and she no longer needs a  second carpal tunnel surgery.   She was seen by Dr Renold Genta on 08/02/2021 who recommended coronary calcium scoring due to her history of hyperlipidemia in November of 2022 and family history of heart disease. LDL levels at that time were 113, with HDL at 92. She elected to defer calcium scoring until discussing with me.   Today:  She is doing well overall. Dr. Renold Genta recommended a coronary calcium score and that concerned her. Dr. Renold Genta recommended this scan due to believing she needs to start a statin medication.  She did experience some stomach upset after eating shrimp recently but allergy test were negative. She carries an Epi pen just incase a true reaction occurred.   Her high cholesterol in November of 2022 she chose to manage with diet. Her level did decrease after that management. She reports that she does not have a family history of high cholesterol, rather her father's COPD was not managed and that likely caused his MI. Her two sisters do have a history of hypertension. Her brother died at 61 y.o. of rheumatic fever, specifically he had a anaphylactic reaction to penicillin.   She also clarifies that she has never been on a cholesterol medication before. There was also concern at one point for a seafood allergy, but she has had extensive evaluation including rechallenge and has not had any issues. Does have an epi pen if she needs it.  She is dealing with the inflammation of psoriatic arthritis daily. Recently she  stopped the Simponi and Arava due to side effects. She experienced skin irritation and was also experiencing GI issues. She felt significantly better when she stopped the medication.   She started playing pickle ball 2 years ago and continues to play regularly to help keep active.   She denies any palpitations, chest pain, shortness of breath, or peripheral edema. No lightheadedness, headaches, syncope, orthopnea, or PND.     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   Psoriatic arthritis ([place]) 02/18/2019   Thyroid disease     Past Surgical History:  Procedure Laterality Date   BACK SURGERY     BREAST BIOPSY  1972   benign   CARPAL TUNNEL RELEASE Left 12/17/2018   Procedure: LEFT CARPAL TUNNEL RELEASE;  Surgeon: Leanora Cover, MD;  Location: [facility];  Service: Orthopedics;  Laterality: Left;   CHOLECYSTECTOMY     LUMBAR LAMINECTOMY  1999   L4   LUMBAR LAMINECTOMY  1981   L5   TOTAL HIP ARTHROPLASTY Right 07/02/2012   Procedure: RIGHT TOTAL HIP ARTHROPLASTY ANTERIOR APPROACH;  Surgeon: Mcarthur Rossetti, MD;  Location: [facility];  Service: Orthopedics;  Laterality: Right;    Current Medications: Current Outpatient Medications on File Prior to Visit  Medication Sig   ALPRAZolam (XANAX) 0.5 MG tablet TAKE 1 TABLET BY MOUTH EACH NIGHT AT BEDTIME AS NEEDED SLEEP   buPROPion (WELLBUTRIN XL) 150 MG 24 hr tablet TAKE 1 TABLET BY MOUTH EVERY DAY   EPINEPHrine 0.3 mg/0.3 mL IJ SOAJ injection    glucosamine-chondroitin 500-400 MG tablet Take 1 tablet by mouth 2 (two) times daily.   Magnesium 400 MG CAPS Take by mouth.   Multiple Vitamin (MULTIVITAMIN) tablet Take 1 tablet by mouth daily.   Multiple Vitamins-Minerals (PRESERVISION AREDS 2 PO) Take by mouth.   OVER THE COUNTER MEDICATION Vitamin D 3  One daily   potassium chloride SA (KLOR-CON M) 20 MEQ tablet TAKE 1 TABLET BY MOUTH EVERY DAY   rOPINIRole (REQUIP) 0.5 MG tablet TAKE 1 TO 2 TABLETS BY MOUTH AT BEDTIME FOR FOR RESTLESS LEGS SYNDROME   SYNTHROID 88 MCG tablet TAKE 1 TABLET BY MOUTH DAILY BEFORE  BREAKFAST   torsemide (DEMADEX) 20 MG tablet TAKE 1 TABLET BY MOUTH 2 TIMES A DAY   valACYclovir (VALTREX) 500 MG tablet Take 1 tablet (500 mg total) by mouth daily.   Current Facility-Administered Medications on File Prior to Visit  Medication   clotrimazole-betamethasone (LOTRISONE) cream     Allergies:   Betadine [povidone iodine], Penicillins, Levofloxacin, and Doxycycline   Social History   Tobacco Use   Smoking status: Never   Smokeless tobacco: Never  Vaping Use   Vaping Use: Never used  Substance Use Topics   Alcohol use: Yes    Comment: socially   Drug use: No    Family History: The patient's family history includes Asthma in her daughter; Heart disease in her brother and father; Hypertension in her sister and sister.  ROS:   Please see the  history of present illness.   (+) arthralgias Additional pertinent ROS otherwise unremarkable.  EKGs/Labs/Other Studies Reviewed:    The following studies were reviewed today:  Bilateral Vas [place] Reflux 11/25/2018: Summary:  Right: No reflux was noted in the common femoral vein, femoral vein in the  thigh, popliteal vein, great saphenous vein at the proximal thigh, great  saphenous vein at the mid thigh, great saphenous vein at the distal thigh,  proximal small saphenous vein,  and mid small saphenous vein. Abnormal reflux times were noted in the  great saphenous vein at the saphenofemoral junction. Evidence of chronic  venous insufficiency is detected in the great saphenous vein. There is no  evidence of deep vein thrombosis in  the lower extremity. There is no evidence of superficial venous  thrombosis. No cystic structure found in the popliteal fossa. Irregular  structures at inguinal crease suggest enlarged lymph nodes.  Left: No reflux was noted in the femoral vein in the thigh, popliteal  vein, great saphenous vein at the distal thigh, and great saphenous vein  at the knee. Abnormal reflux times were noted in the  common femoral vein,  great saphenous vein at the  saphenofemoral junction, great saphenous vein at the proximal thigh, great  saphenous vein at the mid thigh, prox small saphenous vein, and mid small  saphenous vein. Evidence of chronic venous insufficiency is detected in  the great saphenous vein, deep  venous system, and small saphenous vein. There is no evidence of deep vein  thrombosis in the lower extremity. There is no evidence of superficial  venous thrombosis. No cystic structure found in the popliteal fossa.  Irregular structures at inguinal crease   suggest enlarged lymph nodes.   Echo 10/22/2018   1. The left ventricle has normal systolic function with an ejection  fraction of 60-65%. The cavity size was normal. Left ventricular diastolic  Doppler parameters are consistent with impaired relaxation.   2. The right ventricle has normal systolic function. The cavity was  normal. There is no increase in right ventricular wall thickness. Right  ventricular systolic pressure could not be assessed.  EKG:  EKG is personally reviewed.   11/28/2021: NSR at 66 bpm 01/10/2020: normal sinus rhythm with artifact, rate 62 bpm  Recent Labs: 01/25/2021: ALT 24; BUN 11; Creat 0.73; Hemoglobin 15.4; Platelets 323; Potassium 4.1; Sodium 145; TSH 0.96  Recent Lipid Panel    Component Value Date/Time   CHOL 227 (H) 07/30/2021 0903   TRIG 109 07/30/2021 0903   HDL 92 07/30/2021 0903   CHOLHDL 2.5 07/30/2021 0903   VLDL 13 10/02/2015 1103   LDLCALC 113 (H) 07/30/2021 0903    Physical Exam:    VS:  BP 124/74 (BP Location: Left Arm, Patient Position: Sitting, Cuff Size: Normal)   Pulse 66   Ht '5\' 3"'  (1.6 m)   Wt 140 lb 9.6 oz (63.8 kg)   BMI 24.91 kg/m     Wt Readings from Last 3 Encounters:  11/28/21 140 lb 9.6 oz (63.8 kg)  08/02/21 140 lb 4 oz (63.6 kg)  01/31/21 148 lb (67.1 kg)   GEN: Well nourished, well developed in no acute distress HEENT: Normal, moist mucous  membranes NECK: No JVD CARDIAC: regular rhythm, normal S1 and S2, no rubs or gallops. No murmur. VASCULAR: Radial and DP pulses 2+ bilaterally. No carotid bruits RESPIRATORY:  Clear to auscultation without rales, wheezing or rhonchi  ABDOMEN: Soft, non-tender, non-distended MUSCULOSKELETAL:  Ambulates independently SKIN: Warm and dry, no  significant LE edema NEUROLOGIC:  Alert and oriented x 3. No focal neuro deficits noted. PSYCHIATRIC:  Normal affect   ASSESSMENT:    1. Psoriatic arthritis ([place])   2. Chronic venous insufficiency   3. Cardiac risk counseling   4. Counseling on health promotion and disease prevention     PLAN:    Psoriatic arthritis CV risk -we reviewed risk of CAD with chronic inflammation. Also reviewed guidelines (which given her age, she is outside the guideline recommendations for screening) -discussed pathology of cholesterol, how plaques form, that MI/CVA result commonly from acute plaque rupture and not gradual stenosis. Discussed mechanism of statin to both decrease plaque accumulation and stabilize plaque that is already present. Discussed that calcium is a marker for plaque, with decades of validated data regarding average amounts of calcium for age/gender/ethnicity, as well as value of calcium score for risk stratification  -we reviewed the pros/cons of calcium scores. A cardiac CT scan for coronary calcium is a non-invasive way of obtaining information about the presence, location and extent of calcified plaque in the coronary arteries--the vessels that supply oxygen-containing blood to the heart muscle. Calcified plaque results when there is a build-up of fat and other substances under the inner layer of the artery. This material can calcify which signals the presence of atherosclerosis, a disease of the vessel wall, also called coronary artery disease.  People with this disease have an increased risk for heart attacks. In addition, over time, progression of  plaque build up (CAD) can narrow the arteries or even close off blood flow to the heart. Because calcium is a marker of CAD, the amount of calcium detected on a cardiac CT scan is a helpful prognostic tool.  -we reviewed the charts together which show the relationship between calcium score and 15 year all cause mortality -after shared decision making, will proceed with coronary calcium score. They understand this is an out of pocket/self pay test currently costing $99.  We discussed at length that she is outside the typical window for screening with calcium score, which she understands. I would not expect calcium score to be zero. Would use her percentiles for decision making. She would like to avoid medication if at all possible, but if her calcium score places her in a very high percentile, she would be willing to consider. She has had significant reactions to other medication classes in the past and is concerned about the potential for reaction. If statin is started, would start at a low dose and increase gradually.   Bilateral LE edema: largely resolved -Sudden onset, steroid responsiveness concerning for inflammatory process -significant venous insufficiency on testing -much improved since rheumatology treatments began -normal BNP, echo (only grade 1 DD) do not suggest cardiac etiology -she is being treated/managed for venous insufficiency, appropriately, which we discussed at length today. Continue compression, elevation, activity, torsemide  Cardiac risk counseling and prevention recommendations: -recommend heart healthy/Mediterranean diet, with whole grains, fruits, vegetable, fish, lean meats, nuts, and olive oil. Limit salt. -recommend moderate walking, 3-5 times/week for 30-50 minutes each session. Aim for at least 150 minutes.week. Goal should be pace of 3 miles/hours, or walking 1.5 miles in 30 minutes -recommend avoidance of tobacco products. Avoid excess alcohol. -ASCVD risk  score: The 10-year ASCVD risk score (Arnett DK, et al., 2019) is: 21.9%   Values used to calculate the score:     Age: 35 years     Sex: Female     Is Non-Hispanic African American: No  Diabetic: No     Tobacco smoker: No     Systolic Blood Pressure: 202 mmHg     Is BP treated: No     HDL Cholesterol: 92 mg/dL     Total Cholesterol: 227 mg/dL   This is largely driven by age, and she does not currently have other risk factors beyond chronic inflammation  Plan for follow up: TBD pending results of calcium scoring  Total time of encounter: 40 minutes total time of encounter, including 27 minutes spent in face-to-face patient care. This time includes coordination of care and counseling regarding above conditions. Remainder of non-face-to-face time involved reviewing chart documents/testing relevant to the patient encounter and documentation in the medical record.  Buford Dresser, MD, PhD, [facility]    Medication Adjustments/Labs and Tests Ordered: Current medicines are reviewed at length with the patient today.  Concerns regarding medicines are outlined above.  No orders of the defined types were placed in this encounter.  No orders of the defined types were placed in this encounter.   Patient Instructions  Medication Instructions:  Your Physician recommend you continue on your current medication as directed.    *If you need a refill on your cardiac medications before your next appointment, please call your pharmacy*   Lab Work: None ordered today   Testing/Procedures: None ordered today   Follow-Up: At [facility], you and your health needs are our priority.  As part of our continuing mission to provide you with exceptional heart care, we have created designated Provider Care Teams.  These Care Teams include your primary Cardiologist (physician) and Advanced Practice Providers (APPs -  Physician Assistants and Nurse Practitioners)  who all work together to provide you with the care you need, when you need it.  We recommend signing up for the patient portal called "MyChart".  Sign up information is provided on this After Visit Summary.  MyChart is used to connect with patients for Virtual Visits (Telemedicine).  Patients are able to view lab/test results, encounter notes, upcoming appointments, etc.  Non-urgent messages can be sent to your provider as well.   To learn more about what you can do with MyChart, go to [email or web address].    Your next appointment:   To be determined    The format for your next appointment:   In Person  Provider:   Buford Dresser, MD              Burnett Kanaris Ford,acting as a scribe for Buford Dresser, MD.,have documented all relevant documentation on the behalf of Buford Dresser, MD,as directed by  Buford Dresser, MD while in the presence of Buford Dresser, MD.   I, Buford Dresser, MD, have reviewed all documentation for this visit. The documentation on 11/28/21 for the exam, diagnosis, procedures, and orders are all accurate and complete.   Signed, Buford Dresser, MD PhD 11/28/2021  [place]

## 2021-11-28 NOTE — Patient Instructions (Signed)
Medication Instructions:  Your Physician recommend you continue on your current medication as directed.    *If you need a refill on your cardiac medications before your next appointment, please call your pharmacy*   Lab Work: None ordered today   Testing/Procedures: None ordered today   Follow-Up: At [facility], you and your health needs are our priority.  As part of our continuing mission to provide you with exceptional heart care, we have created designated Provider Care Teams.  These Care Teams include your primary Cardiologist (physician) and Advanced Practice Providers (APPs -  Physician Assistants and Nurse Practitioners) who all work together to provide you with the care you need, when you need it.  We recommend signing up for the patient portal called "MyChart".  Sign up information is provided on this After Visit Summary.  MyChart is used to connect with patients for Virtual Visits (Telemedicine).  Patients are able to view lab/test results, encounter notes, upcoming appointments, etc.  Non-urgent messages can be sent to your provider as well.   To learn more about what you can do with MyChart, go to [email or web address].    Your next appointment:   To be determined    The format for your next appointment:   In Person  Provider:   Buford Dresser, MD

## 2021-11-29 NOTE — Addendum Note (Signed)
Addended by: Meryl Crutch on: 11/29/2021 08:13 AM   Modules accepted: Orders

## 2021-12-04 ENCOUNTER — Ambulatory Visit (HOSPITAL_BASED_OUTPATIENT_CLINIC_OR_DEPARTMENT_OTHER)
Admission: RE | Admit: 2021-12-04 | Discharge: 2021-12-04 | Disposition: A | Discharge status: Home / Self Care | Payer: Medicare Other | Source: Ambulatory Visit | Attending: Internal Medicine | Admitting: Internal Medicine

## 2021-12-04 ENCOUNTER — Encounter (HOSPITAL_BASED_OUTPATIENT_CLINIC_OR_DEPARTMENT_OTHER): Payer: Self-pay

## 2021-12-04 DIAGNOSIS — E7800 Pure hypercholesterolemia, unspecified: Secondary | ICD-10-CM | POA: Insufficient documentation

## 2021-12-04 DIAGNOSIS — Z8249 Family history of ischemic heart disease and other diseases of the circulatory system: Secondary | ICD-10-CM | POA: Insufficient documentation

## 2021-12-16 DIAGNOSIS — Z23 Encounter for immunization: Secondary | ICD-10-CM | POA: Diagnosis not present

## 2021-12-19 DIAGNOSIS — R5383 Other fatigue: Secondary | ICD-10-CM | POA: Diagnosis not present

## 2021-12-19 DIAGNOSIS — Z6823 Body mass index (BMI) 23.0-23.9, adult: Secondary | ICD-10-CM | POA: Diagnosis not present

## 2021-12-19 DIAGNOSIS — L4050 Arthropathic psoriasis, unspecified: Secondary | ICD-10-CM | POA: Diagnosis not present

## 2021-12-19 DIAGNOSIS — L409 Psoriasis, unspecified: Secondary | ICD-10-CM | POA: Diagnosis not present

## 2021-12-19 DIAGNOSIS — M25469 Effusion, unspecified knee: Secondary | ICD-10-CM | POA: Diagnosis not present

## 2021-12-19 DIAGNOSIS — M7989 Other specified soft tissue disorders: Secondary | ICD-10-CM | POA: Diagnosis not present

## 2021-12-19 DIAGNOSIS — Z79899 Other long term (current) drug therapy: Secondary | ICD-10-CM | POA: Diagnosis not present

## 2021-12-24 ENCOUNTER — Encounter: Payer: Self-pay | Admitting: Internal Medicine

## 2021-12-24 ENCOUNTER — Ambulatory Visit (INDEPENDENT_AMBULATORY_CARE_PROVIDER_SITE_OTHER): Payer: Medicare Other | Admitting: Internal Medicine

## 2021-12-24 VITALS — BP 122/68 | HR 63 | Temp 98.8°F | Ht 63.0 in | Wt 140.8 lb

## 2021-12-24 DIAGNOSIS — E039 Hypothyroidism, unspecified: Secondary | ICD-10-CM

## 2021-12-24 DIAGNOSIS — B029 Zoster without complications: Secondary | ICD-10-CM | POA: Diagnosis not present

## 2021-12-24 DIAGNOSIS — L4050 Arthropathic psoriasis, unspecified: Secondary | ICD-10-CM

## 2021-12-24 MED ORDER — VALACYCLOVIR HCL 1 G PO TABS: ORAL_TABLET | ORAL | 0 refills | Status: DC

## 2021-12-24 NOTE — Telephone Encounter (Signed)
scheduled

## 2021-12-25 NOTE — Progress Notes (Signed)
   Subjective:    Patient ID: [name], female    DOB: [birth year], 76 y.o.   MRN: [id number]  HPI 76 year old Female seen acutely today regarding bumpy rash right trunk.  Itches and burns when she scratches it.  She has had this for approximately 2 days.  Patient has a history of arthropathic psoriasis.  She sees Dr. Trudie Reed.  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  History of hypothyroidism treated with thyroid replacement medication.  She was feeling well in the Spring and Summer but lately has been quite tired.  History of restless leg syndrome.  Review of Systems she looks a bit fatigued and is clearly frustrated that she does not feel as well as she did a few months ago.     Objective:   Physical Exam   Vital signs reviewed.  She is in no acute distress.  There is an irregular macular light red rash right trunk that appears to have some tiny vesicles.  This would be consistent with Herpes zoster.     Assessment & Plan:   Herpes zoster right trunk  Psoriatic arthritis followed by Rheumatologist  Hypothyroidism with thyroid replacement medication  Plan: May apply calamine lotion to lesions topically on right trunk.  Take Valtrex 1000 mg 3 times daily for 7 days.

## 2021-12-28 NOTE — Patient Instructions (Addendum)
You have a diagnosed with shingles and have been prescribed Valtrex 1000 mg 3 times daily for 7 days.  May apply calamine lotion topically if needed.

## 2022-01-06 ENCOUNTER — Encounter: Payer: Self-pay | Admitting: Internal Medicine

## 2022-01-09 ENCOUNTER — Ambulatory Visit (INDEPENDENT_AMBULATORY_CARE_PROVIDER_SITE_OTHER): Payer: Medicare Other

## 2022-01-09 ENCOUNTER — Ambulatory Visit (INDEPENDENT_AMBULATORY_CARE_PROVIDER_SITE_OTHER): Payer: Medicare Other | Admitting: Physician Assistant

## 2022-01-09 ENCOUNTER — Encounter: Payer: Self-pay | Admitting: Physician Assistant

## 2022-01-09 DIAGNOSIS — M25562 Pain in left knee: Secondary | ICD-10-CM | POA: Diagnosis not present

## 2022-01-09 NOTE — Progress Notes (Signed)
Office Visit Note   Patient: [name]           Date of Birth: Mar 20, 1945           MRN: [id number] Visit Date: 01/09/2022              Requested by: Elby Showers, MD [address] [place],  [place] [address] PCP: Elby Showers, MD   Assessment & Plan: Visit Diagnoses:  1. Acute pain of left knee     Plan: Discussed with her knee friendly exercises.  Also discussed need to work on Forensic scientist.  We questions were encouraged and answered.  She will follow-up with [place] as needed.  Follow-Up Instructions: Return if symptoms worsen or fail to improve.   Orders:  Orders Placed This Encounter  Procedures   XR Knee 1-2 Views Left   No orders of the defined types were placed in this encounter.     Procedures: No procedures performed   Clinical Data: No additional findings.   Subjective: Chief Complaint  Patient presents with   Left Knee - Pain    HPI [name] 76 year old female well-known to Dr. Delilah Shan service comes in today with left knee pain.  She has known psoriatic arthritis.  She is currently on prednisone treatment of 30 mg/day.  She states this is greatly helped with her knee pain shoulder pain multiple other joints but really has not helped with the pain in her hands.  She notes swelling left knee which is going down since she has been on the prednisone.  She just wants to have her knee evaluated in regards to how much arthritis she might have in the knee.  No known injury.  Denies any mechanical symptoms of either knee.  Review of Systems See HPI otherwise negative  Objective: Vital Signs: There were no vitals taken for this visit.  Physical Exam General: Well-developed well-nourished female no acute distress. Psych: Alert and oriented x3 Ortho Exam Bilateral knees with full range of motion of both knees.  Significant patellofemoral crepitus both knees.  Quad atrophy bilaterally.  No instability valgus varus stressing of either knee.  No  abnormal warmth erythema or effusion of either knee. Specialty Comments:  No specialty comments available.  Imaging: XR Knee 1-2 Views Left  Result Date: 01/09/2022 Left knee 2 views: Medial lateral joint line well-preserved.  Mild patellofemoral changes.  No acute fractures no dislocations.  Normal bone density.    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/2015   Chronic dryness of both eyes 09/21/2015   Dermatochalasis of eyelid 09/21/2015   Presbyopia of both eyes 09/21/2015   PVD (posterior vitreous detachment), both eyes 09/21/2015   Regular astigmatism of both eyes 09/21/2015   Lateral cystocele 07/23/2015   Rectocele 07/23/2015   Osteoarthritis of right hip 05/24/2012   GE reflux 09/23/2011   Hypothyroidism 01/07/2011   Anxiety 01/07/2011   Allergic rhinitis 01/07/2011   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   Past Medical History:  Diagnosis Date   Allergic bronchitis    Allergic bronchitis    Allergy  Arthritis    Claustrophobia    Dependent edema    GERD (gastroesophageal reflux disease)    occas problem - burning, cough would take prilosec if needed   H/O hiatal hernia    HSV-2 (herpes simplex virus 2) infection    Hypothyroidism    Peripheral neuropathy    3 toes right foot 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 problem   Psoriatic arthritis ([place]) 02/18/2019   Thyroid disease     Family History  Problem Relation Age of Onset   Heart disease Father    Hypertension Sister    Heart disease Brother    Asthma Daughter     Hypertension Sister     Past Surgical History:  Procedure Laterality Date   BACK SURGERY     BREAST BIOPSY  1972   benign   CARPAL TUNNEL RELEASE Left 12/17/2018   Procedure: LEFT CARPAL TUNNEL RELEASE;  Surgeon: Leanora Cover, MD;  Location: [facility];  Service: Orthopedics;  Laterality: Left;   CHOLECYSTECTOMY     LUMBAR LAMINECTOMY  1999   L4   LUMBAR LAMINECTOMY  1981   L5   TOTAL HIP ARTHROPLASTY Right 07/02/2012   Procedure: RIGHT TOTAL HIP ARTHROPLASTY ANTERIOR APPROACH;  Surgeon: Mcarthur Rossetti, MD;  Location: [facility];  Service: Orthopedics;  Laterality: Right;   Social History   Occupational History   Not on file  Tobacco Use   Smoking status: Never   Smokeless tobacco: Never  Vaping Use   Vaping Use: Never used  Substance and Sexual Activity   Alcohol use: Yes    Comment: socially   Drug use: No   Sexual activity: Not on file

## 2022-01-13 DIAGNOSIS — L4050 Arthropathic psoriasis, unspecified: Secondary | ICD-10-CM | POA: Diagnosis not present

## 2022-01-16 DIAGNOSIS — Z23 Encounter for immunization: Secondary | ICD-10-CM | POA: Diagnosis not present

## 2022-01-29 DIAGNOSIS — L4050 Arthropathic psoriasis, unspecified: Secondary | ICD-10-CM | POA: Diagnosis not present

## 2022-02-17 ENCOUNTER — Encounter: Payer: Self-pay | Admitting: Internal Medicine

## 2022-02-17 MED ORDER — ROPINIROLE HCL 0.5 MG PO TABS: ORAL_TABLET | ORAL | 0 refills | Status: DC

## 2022-02-17 NOTE — Telephone Encounter (Signed)
I have sent this in for 3 times daily but am not comfortable with taking this much in this manner. You will need neurology Consultation about this. We can make referral to [facility] neuro or someone else of your choice.

## 2022-02-24 DIAGNOSIS — L4050 Arthropathic psoriasis, unspecified: Secondary | ICD-10-CM | POA: Diagnosis not present

## 2022-02-28 ENCOUNTER — Other Ambulatory Visit: Payer: Medicare Other

## 2022-02-28 DIAGNOSIS — E039 Hypothyroidism, unspecified: Secondary | ICD-10-CM

## 2022-02-28 DIAGNOSIS — E7800 Pure hypercholesterolemia, unspecified: Secondary | ICD-10-CM | POA: Diagnosis not present

## 2022-02-28 DIAGNOSIS — F419 Anxiety disorder, unspecified: Secondary | ICD-10-CM | POA: Diagnosis not present

## 2022-02-28 DIAGNOSIS — F32A Depression, unspecified: Secondary | ICD-10-CM | POA: Diagnosis not present

## 2022-03-01 LAB — CBC WITH DIFFERENTIAL/PLATELET
Absolute Monocytes: 799 cells/uL (ref 200–950)
Basophils Absolute: 68 cells/uL (ref 0–200)
Basophils Relative: 0.8 %
Eosinophils Absolute: 162 cells/uL (ref 15–500)
Eosinophils Relative: 1.9 %
HCT: 41.2 % (ref 35.0–45.0)
Hemoglobin: 14.3 g/dL (ref 11.7–15.5)
Lymphs Abs: 2644 cells/uL (ref 850–3900)
MCH: 32.4 pg (ref 27.0–33.0)
MCHC: 34.7 g/dL (ref 32.0–36.0)
MCV: 93.4 fL (ref 80.0–100.0)
MPV: 8.4 fL (ref 7.5–12.5)
Monocytes Relative: 9.4 %
Neutro Abs: 4828 cells/uL (ref 1500–7800)
Neutrophils Relative %: 56.8 %
Platelets: 318 10*3/uL (ref 140–400)
RBC: 4.41 10*6/uL (ref 3.80–5.10)
RDW: 12.6 % (ref 11.0–15.0)
Total Lymphocyte: 31.1 %
WBC: 8.5 10*3/uL (ref 3.8–10.8)

## 2022-03-01 LAB — LIPID PANEL
Cholesterol: 228 mg/dL — ABNORMAL HIGH (ref <200)
HDL: 91 mg/dL (ref >=50)
LDL Cholesterol (Calc): 118 mg/dL (calc) — ABNORMAL HIGH
Non-HDL Cholesterol (Calc): 137 mg/dL (calc) — ABNORMAL HIGH (ref <130)
Total CHOL/HDL Ratio: 2.5 (calc) (ref <5.0)
Triglycerides: 90 mg/dL (ref <150)

## 2022-03-01 LAB — COMPLETE METABOLIC PANEL WITH GFR
AG Ratio: 1.7 (calc) (ref 1.0–2.5)
ALT: 21 U/L (ref 6–29)
AST: 22 U/L (ref 10–35)
Albumin: 4.4 g/dL (ref 3.6–5.1)
Alkaline phosphatase (APISO): 88 U/L (ref 37–153)
BUN: 20 mg/dL (ref 7–25)
CO2: 30 mmol/L (ref 20–32)
Calcium: 9.9 mg/dL (ref 8.6–10.4)
Chloride: 98 mmol/L (ref 98–110)
Creat: 0.72 mg/dL (ref 0.60–1.00)
Globulin: 2.6 g/dL (calc) (ref 1.9–3.7)
Glucose, Bld: 73 mg/dL (ref 65–99)
Potassium: 3.9 mmol/L (ref 3.5–5.3)
Sodium: 137 mmol/L (ref 135–146)
Total Bilirubin: 1 mg/dL (ref 0.2–1.2)
Total Protein: 7 g/dL (ref 6.1–8.1)
eGFR: 86 mL/min/{1.73_m2} (ref >=60)

## 2022-03-01 LAB — TSH: TSH: 4.08 mIU/L (ref 0.40–4.50)

## 2022-03-03 ENCOUNTER — Ambulatory Visit: Payer: Medicare Other | Admitting: Internal Medicine

## 2022-03-03 ENCOUNTER — Other Ambulatory Visit: Payer: Medicare Other

## 2022-03-04 ENCOUNTER — Encounter: Payer: Self-pay | Admitting: Internal Medicine

## 2022-03-04 ENCOUNTER — Ambulatory Visit (INDEPENDENT_AMBULATORY_CARE_PROVIDER_SITE_OTHER): Payer: Medicare Other | Admitting: Internal Medicine

## 2022-03-04 VITALS — BP 120/76 | HR 64 | Temp 98.6°F | Ht 63.0 in | Wt 140.8 lb

## 2022-03-04 DIAGNOSIS — F32A Depression, unspecified: Secondary | ICD-10-CM | POA: Diagnosis not present

## 2022-03-04 DIAGNOSIS — M1990 Unspecified osteoarthritis, unspecified site: Secondary | ICD-10-CM | POA: Diagnosis not present

## 2022-03-04 DIAGNOSIS — E039 Hypothyroidism, unspecified: Secondary | ICD-10-CM

## 2022-03-04 DIAGNOSIS — Z5181 Encounter for therapeutic drug level monitoring: Secondary | ICD-10-CM

## 2022-03-04 DIAGNOSIS — G2581 Restless legs syndrome: Secondary | ICD-10-CM

## 2022-03-04 DIAGNOSIS — Z8249 Family history of ischemic heart disease and other diseases of the circulatory system: Secondary | ICD-10-CM

## 2022-03-04 DIAGNOSIS — E7800 Pure hypercholesterolemia, unspecified: Secondary | ICD-10-CM

## 2022-03-04 DIAGNOSIS — R609 Edema, unspecified: Secondary | ICD-10-CM

## 2022-03-04 DIAGNOSIS — Z87898 Personal history of other specified conditions: Secondary | ICD-10-CM | POA: Diagnosis not present

## 2022-03-04 DIAGNOSIS — Z8619 Personal history of other infectious and parasitic diseases: Secondary | ICD-10-CM | POA: Diagnosis not present

## 2022-03-04 DIAGNOSIS — Z0000 Encounter for general adult medical examination without abnormal findings: Secondary | ICD-10-CM | POA: Diagnosis not present

## 2022-03-04 DIAGNOSIS — Z79899 Other long term (current) drug therapy: Secondary | ICD-10-CM | POA: Diagnosis not present

## 2022-03-04 DIAGNOSIS — F419 Anxiety disorder, unspecified: Secondary | ICD-10-CM

## 2022-03-04 LAB — POCT URINALYSIS DIPSTICK
Bilirubin, UA: NEGATIVE
Blood, UA: NEGATIVE
Glucose, UA: NEGATIVE
Ketones, UA: NEGATIVE
Leukocytes, UA: NEGATIVE
Nitrite, UA: NEGATIVE
Protein, UA: NEGATIVE
Spec Grav, UA: 1.01 (ref 1.010–1.025)
Urobilinogen, UA: 0.2 E.U./dL
pH, UA: 5 (ref 5.0–8.0)

## 2022-03-04 NOTE — Patient Instructions (Addendum)
RTC in 6 months or as needed.  Looking to hearing about your consult with [facility]

## 2022-03-04 NOTE — Progress Notes (Signed)
Annual Wellness Visit     Patient: [name], Female    DOB: [birth year], 76 y.o.   MRN: [id number] Visit Date: 03/04/2022   Subjective      HPI Seen for Medicare wellness and health maintenance exam as well as evaluation of medical issues Currently on short course of prednisone and Remicade per dr. Trudie Reed.  Has had flu vaccine and Covid vaccine. Discussed need for RSV and pneumococcal 20. Did Cologard 2022. Labs reviewed and are stable. Going to GYN for mammogram in February.  Has been on on Remicade per Dr. Trudie Reed. Seeing Rheumatologist at [facility] in February.  Previously was diagnosed with psoriatic arthritis by Dr. Lenna Gilford at [facility].  Patient has had waxing and waning of her symptoms over the past few years.  She has a history of seafood allergy and vasomotor rhinitis seen by Dr. Remus Blake, allergist.  Had food challenge in March 2023.  History of chronic cough and vasomotor rhinitis.  History of DMARD therapy.  Patient says a lot of her symptoms started when she was stung by fire ants in the summer 2022.  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  She has a history of dependent edema and saw Dr. Harrell Gave, cardiologist treated with Lasix and Demadex.  Compression stockings were recommended by vascular surgeon for chronic venous insufficiency.  Was not thought to have peripheral vascular disease.  The stockings did not help.  She was found to have an elevated eosinophil count and noticed her edema always got better with steroids.  Her sed rate was 11.    History of right hip arthroplasty by Dr. Ninfa Linden in 2014.  She is allergic to penicillin-it causes anaphylaxis  History of GE reflux, allergic rhinitis, neuropathy of right foot and anxiety.  History of benign left breast biopsy 1972, lumbar disc surgery L4 1999, lumbar disc surgery at L5 1991. Had  cholecystectomy in 2004.  Colonoscopy initially done in 2007 with 10-year follow-up recommended. Saw Ortho in late October for knee pain that responded to prednisone.  Social history: She is married.  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  She does not smoke.  Social alcohol consumption.  She lost a stepson to suicide in 2018.  She has 2 adult daughters from her previous marriage.  Family history: 1 brother died at age 30 of congestive heart failure secondary to rheumatic fever complications.  1 sister with hypertension and obesity.  Another sister with hypertension and obesity.  1 brother living.  Mother deceased with history of neurological problems and heart disease.  History of anxiety depression treated with Wellbutrin and alprazolam.  Dependent edema has been treated most recently with Demadex and hypothyroidism treated with thyroid replacement medication.  She is on suppression therapy for HSV type II with Valtrex 500 mg daily.  History of restless leg syndrome treated with Requip.      Review of Systems see above   Objective  Blood pressure 120/76, pulse 64, temperature 98.6 degrees pulse oximetry 98% on room air weight 140 pounds 12.8 ounces BMI 24.94  Skin: Warm and dry.  No cervical adenopathy, thyromegaly or carotid bruits.  Chest clear.  Cardiac exam: Regular rate and rhythm without ectopy.  Abdomen is soft nondistended  without hepatosplenomegaly masses or tenderness.  Trace pitting edema of the lower extremities.  Brief neurological exam is intact without gross focal deficits.  Affect thought and judgment are normal.  GYN exam deferred to gynecologist    Vitals: Reviewed.   Most recent functional status assessment:    03/04/2022   11:02 AM  In your present state of health, do you have any difficulty performing the following activities:  Hearing? 1   Vision? 1  Difficulty concentrating or making decisions? 0  Walking or climbing stairs? 0  Dressing or bathing? 0  Doing errands, shopping? 0  Preparing Food and eating ? N  Using the Toilet? N  In the past six months, have you accidently leaked urine? N  Do you have problems with loss of bowel control? N  Managing your Medications? N  Managing your Finances? N  Housekeeping or managing your Housekeeping? N   Most recent fall risk assessment:    03/04/2022   11:01 AM  Fall Risk   Falls in the past year? 0  Number falls in past yr: 0  Injury with Fall? 0  Risk for fall due to : No Fall Risks  Follow up Falls prevention discussed    Most recent depression screenings:    03/04/2022   11:01 AM 12/24/2021    3:48 PM  PHQ 2/9 Scores  PHQ - 2 Score 0 0   Most recent cognitive screening:    01/31/2021   10:11 AM  6CIT Screen  What Year? 0 points  What month? 0 points  What time? 0 points  Count back from 20 0 points  Months in reverse 2 points  Repeat phrase 10 points  Total Score 12 points       Assessment & Plan   Patient has had difficult time since being stung by fire ants.  Had inflammatory arthropathy and has seen Eheumatologist here in [place] and will be seeing Rheumatologist at [facility] in the near future.  Longstanding history of hypothyroidism-stable  Dependent edema which has been treated with diuretics-currently torsemide  Hypothyroidism treated with Synthroid 88 mcg daily  History of anxiety treated with Xanax  History of situational stress treated with Wellbutrin  History of restless leg syndrome treated with Requip  Plan: Will be looking forward to hearing what Rheumatologist at [facility] has to say about her condition.  She will continue to see GYN annually.  Needs to have colonoscopy sometime in the near future.  Return in June or as needed.       Annual wellness visit done today including the all of the following: Reviewed  patient's Family Medical History Reviewed and updated list of patient's medical providers Assessment of cognitive impairment was done Assessed patient's functional ability Established a written schedule for health screening [place] Completed and Reviewed  Discussed health benefits of physical activity, and encouraged her to engage in regular exercise appropriate for her age and condition.         {I, Elby Showers, MD, have reviewed all documentation for this visit. The documentation on 04/16/22 for the exam, diagnosis, procedures, and orders are all accurate and complete.   LaVon Barron Alvine, CMA

## 2022-03-21 DIAGNOSIS — L4050 Arthropathic psoriasis, unspecified: Secondary | ICD-10-CM | POA: Diagnosis not present

## 2022-03-21 DIAGNOSIS — Z6824 Body mass index (BMI) 24.0-24.9, adult: Secondary | ICD-10-CM | POA: Diagnosis not present

## 2022-03-21 DIAGNOSIS — M1991 Primary osteoarthritis, unspecified site: Secondary | ICD-10-CM | POA: Diagnosis not present

## 2022-03-21 DIAGNOSIS — L409 Psoriasis, unspecified: Secondary | ICD-10-CM | POA: Diagnosis not present

## 2022-03-21 DIAGNOSIS — Z79899 Other long term (current) drug therapy: Secondary | ICD-10-CM | POA: Diagnosis not present

## 2022-03-26 ENCOUNTER — Other Ambulatory Visit: Payer: Self-pay | Admitting: Internal Medicine

## 2022-04-16 ENCOUNTER — Encounter: Payer: Self-pay | Admitting: Internal Medicine

## 2022-04-21 DIAGNOSIS — L4050 Arthropathic psoriasis, unspecified: Secondary | ICD-10-CM | POA: Diagnosis not present

## 2022-04-24 ENCOUNTER — Other Ambulatory Visit: Payer: Self-pay | Admitting: Internal Medicine

## 2022-05-01 DIAGNOSIS — M25461 Effusion, right knee: Secondary | ICD-10-CM | POA: Diagnosis not present

## 2022-05-01 DIAGNOSIS — M25462 Effusion, left knee: Secondary | ICD-10-CM | POA: Diagnosis not present

## 2022-05-01 DIAGNOSIS — M064 Inflammatory polyarthropathy: Secondary | ICD-10-CM | POA: Diagnosis not present

## 2022-05-01 DIAGNOSIS — M150 Primary generalized (osteo)arthritis: Secondary | ICD-10-CM | POA: Diagnosis not present

## 2022-05-01 DIAGNOSIS — M25841 Other specified joint disorders, right hand: Secondary | ICD-10-CM | POA: Diagnosis not present

## 2022-05-01 DIAGNOSIS — M1990 Unspecified osteoarthritis, unspecified site: Secondary | ICD-10-CM | POA: Diagnosis not present

## 2022-05-01 DIAGNOSIS — M170 Bilateral primary osteoarthritis of knee: Secondary | ICD-10-CM | POA: Diagnosis not present

## 2022-05-01 DIAGNOSIS — M11261 Other chondrocalcinosis, right knee: Secondary | ICD-10-CM | POA: Diagnosis not present

## 2022-05-01 DIAGNOSIS — Z79899 Other long term (current) drug therapy: Secondary | ICD-10-CM | POA: Diagnosis not present

## 2022-05-01 DIAGNOSIS — M25842 Other specified joint disorders, left hand: Secondary | ICD-10-CM | POA: Diagnosis not present

## 2022-05-02 DIAGNOSIS — D3132 Benign neoplasm of left choroid: Secondary | ICD-10-CM | POA: Diagnosis not present

## 2022-05-02 DIAGNOSIS — H52203 Unspecified astigmatism, bilateral: Secondary | ICD-10-CM | POA: Diagnosis not present

## 2022-05-02 DIAGNOSIS — H43813 Vitreous degeneration, bilateral: Secondary | ICD-10-CM | POA: Diagnosis not present

## 2022-05-02 DIAGNOSIS — H524 Presbyopia: Secondary | ICD-10-CM | POA: Diagnosis not present

## 2022-05-02 DIAGNOSIS — H35362 Drusen (degenerative) of macula, left eye: Secondary | ICD-10-CM | POA: Diagnosis not present

## 2022-05-02 DIAGNOSIS — H5203 Hypermetropia, bilateral: Secondary | ICD-10-CM | POA: Diagnosis not present

## 2022-05-02 DIAGNOSIS — Z83518 Family history of other specified eye disorder: Secondary | ICD-10-CM | POA: Diagnosis not present

## 2022-05-02 DIAGNOSIS — H2513 Age-related nuclear cataract, bilateral: Secondary | ICD-10-CM | POA: Diagnosis not present

## 2022-05-13 ENCOUNTER — Telehealth: Payer: Self-pay

## 2022-05-13 NOTE — Telephone Encounter (Signed)
scheduled

## 2022-05-13 NOTE — Telephone Encounter (Signed)
Patient called asking to schedule an appt to discuss results and get advise from [place] Dr. Romero Liner.  I informed patient that an message would be sent and we will contact her to schedule

## 2022-05-19 ENCOUNTER — Encounter: Payer: Self-pay | Admitting: Internal Medicine

## 2022-05-19 ENCOUNTER — Ambulatory Visit (INDEPENDENT_AMBULATORY_CARE_PROVIDER_SITE_OTHER): Payer: Medicare Other | Admitting: Internal Medicine

## 2022-05-19 VITALS — BP 124/68 | HR 92 | Temp 98.9°F | Ht 63.0 in | Wt 146.8 lb

## 2022-05-19 DIAGNOSIS — E2839 Other primary ovarian failure: Secondary | ICD-10-CM | POA: Diagnosis not present

## 2022-05-19 MED ORDER — TORSEMIDE 20 MG PO TABS: 20.0000 mg | ORAL_TABLET | Freq: Every day | ORAL | 3 refills | Status: DC

## 2022-05-19 NOTE — Progress Notes (Signed)
Patient Care Team: Elby Showers, MD as PCP - General (Internal Medicine) Buford Dresser, MD as PCP - Cardiology (Cardiology)  Visit Date: 05/19/22  Subjective:    Patient ID: [name] , Female   DOB: 09/18/1945, 77 y.o.    MRN: [id number]   76 y.o. Female presents today for discussion about recent visit to [facility]. She saw Dr. Romero Liner for consultation and advice as to how to move forward with her Rheumatology treatment including med selection. She had a number of Xrays and labs done there mid February.Xrays showed mild degenerative changes of knees and hands.Uric acid was elevated at 7.7. K was slight low at 3.2. CBC, Mg, Phosphorus were all normal.   Patient has a past medical history of allergies, bronchitis, claustrophobia, dependent edema, GERD, hiatal hernia, HSV-2 infection, hypothyroidism, peripheral neuropathy, psoriatic arthritis, thyroid disease.  Seen for multi-joint arthritis (hand, wrist, feet, ankles) in March 2020 and began seeing a Merchant navy officer at [facility] on my referral.  She tried a variety of medications including methotrexate, which caused GI upset, Simponi, which caused a rash, Celebrex, which cause feet edema, Leflunomide, which caused Diarrhea. Seen by Rheumatologist, Dr. 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 05/02/22 bilateral hand X-rays normal. 05/02/22 bilateral knee X-rays show mild medial compartment joint space narrowing in both knees, small tricompartment ossified to present bilaterally, medial compartment chondrocalcinosis in the right knee with questionable minimal chondrocalcinosis in the right lateral and left medial lateral components as well, trace effusions bilaterally. Patient was advised by Dr. Harlow Asa to use intermittent prednisone therapy for flare ups of arthritis-- I approve of this. She has always  responded well to tapering courses of prednisone. He has diagnosed her with RS3PE.( Remitting seronegative symmetric synovitis) He also thinks she has gout as she has an elevated uric acid level which he indicates can be treated intermittently with prednisone and will give consideration to uric acid lowering therapy depending on how she progresses.   Past Medical History:  Diagnosis Date   Allergic bronchitis    Allergic bronchitis    Allergy    Arthritis    Claustrophobia    Dependent edema    GERD (gastroesophageal reflux disease)    occas problem - burning, cough would take prilosec if needed   H/O hiatal hernia    HSV-2 (herpes simplex virus 2) infection    Hypothyroidism    Peripheral neuropathy    3 toes right foot 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 problem   Psoriatic arthritis ([place]) 02/18/2019   Thyroid disease      Family History  Problem Relation Age of Onset   Heart disease Father    Hypertension Sister    Heart disease Brother    Asthma Daughter    Hypertension Sister          Review of Systems  Constitutional:  Negative for fever and malaise/fatigue.  HENT:  Negative for congestion.   Eyes:  Negative for blurred vision.  Respiratory:  Negative for cough and shortness of breath.   Cardiovascular:  Negative for chest pain, palpitations and leg swelling.  Gastrointestinal:  Negative for vomiting.  Musculoskeletal:  Negative for back pain.  Skin:  Negative for rash.  Neurological:  Negative for loss of consciousness and headaches.        Objective:   Vitals: BP 124/68   Pulse 92   Temp 98.9 F (37.2  C) (Tympanic)   Ht 5\' 3"  (1.6 m)   Wt 146 lb 12.8 oz (66.6 kg)   SpO2 98%   BMI 26.00 kg/m    Physical Exam Vitals and nursing note reviewed.  Constitutional:      General: She is not in acute distress.    Appearance: Normal appearance. She is not toxic-appearing.  HENT:     Head: Normocephalic and atraumatic.  Pulmonary:     Effort: Pulmonary  effort is normal.  Skin:    General: Skin is warm and dry.  Neurological:     Mental Status: She is alert and oriented to person, place, and time. Mental status is at baseline.  Psychiatric:        Mood and Affect: Mood normal.        Behavior: Behavior normal.        Thought Content: Thought content normal.        Judgment: Judgment normal.     Currently has changes of osteoarthritis in her hands but no acute synovitis. Currently no significant pitting edema  Results:   Studies obtained and personally reviewed by me:  05/02/22 bilateral hand X-rays normal. 05/02/22 bilateral knee X-rays show mild medial compartment joint space narrowing in both knees, small tricompartment ossified to present bilaterally, medial compartment chondrocalcinosis in the right knee with questionable minimal chondrocalcinosis in the right lateral and left medial lateral components as well, trace effusions bilaterally.  Labs:       Component Value Date/Time   NA 137 02/28/2022 0956   K 3.9 02/28/2022 0956   CL 98 02/28/2022 0956   CO2 30 02/28/2022 0956   GLUCOSE 73 02/28/2022 0956   BUN 20 02/28/2022 0956   CREATININE 0.72 02/28/2022 0956   CALCIUM 9.9 02/28/2022 0956   PROT 7.0 02/28/2022 0956   ALBUMIN 4.4 01/27/2019 1039   AST 22 02/28/2022 0956   AST 18 01/27/2019 1039   ALT 21 02/28/2022 0956   ALT 13 01/27/2019 1039   ALKPHOS 82 01/27/2019 1039   BILITOT 1.0 02/28/2022 0956   BILITOT 0.7 01/27/2019 1039   GFRNONAA 74 01/17/2020 1144   GFRAA 86 01/17/2020 1144     Lab Results  Component Value Date   WBC 8.5 02/28/2022   HGB 14.3 02/28/2022   HCT 41.2 02/28/2022   MCV 93.4 02/28/2022   PLT 318 02/28/2022    Lab Results  Component Value Date   CHOL 228 (H) 02/28/2022   HDL 91 02/28/2022   LDLCALC 118 (H) 02/28/2022   TRIG 90 02/28/2022   CHOLHDL 2.5 02/28/2022    No results found for: "HGBA1C"   Lab Results  Component Value Date   TSH 4.08 02/28/2022      Assessment &  Plan:   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 Ordered bone density scan.  Continue close follow up at [facility] and will be treated for flares of synovitis with prednisone on an as needed basis. Consideration will be given to gout treatment  by Rheumatology.  Time spent with discussion is 30 minutes including chart review from evaluation at [facility] as a scribe for Elby Showers, MD.,have documented all relevant documentation on the behalf of Elby Showers, MD,as directed by  Elby Showers, MD while in the presence of Elby Showers, MD.   I, Elby Showers, MD, have reviewed all documentation for this visit. The documentation on 06/08/22 for the  exam, diagnosis, procedures, and orders are all accurate and complete.

## 2022-05-20 ENCOUNTER — Other Ambulatory Visit: Payer: Self-pay

## 2022-05-20 MED ORDER — POTASSIUM CHLORIDE CRYS ER 20 MEQ PO TBCR: 20.0000 meq | EXTENDED_RELEASE_TABLET | Freq: Every day | ORAL | 3 refills | Status: DC

## 2022-05-22 ENCOUNTER — Encounter: Payer: Self-pay | Admitting: Radiology

## 2022-05-22 DIAGNOSIS — Z1231 Encounter for screening mammogram for malignant neoplasm of breast: Secondary | ICD-10-CM | POA: Diagnosis not present

## 2022-05-22 LAB — HM MAMMOGRAPHY

## 2022-05-31 ENCOUNTER — Other Ambulatory Visit: Payer: Self-pay | Admitting: Internal Medicine

## 2022-06-02 ENCOUNTER — Encounter: Payer: Self-pay | Admitting: Internal Medicine

## 2022-06-02 DIAGNOSIS — M85852 Other specified disorders of bone density and structure, left thigh: Secondary | ICD-10-CM | POA: Diagnosis not present

## 2022-06-02 DIAGNOSIS — Z780 Asymptomatic menopausal state: Secondary | ICD-10-CM | POA: Diagnosis not present

## 2022-06-02 LAB — HM DEXA SCAN

## 2022-06-02 NOTE — Telephone Encounter (Signed)
Spoke with patient and she stated she usually takes 2 tab and day but sometimes 3 tab and would like to keep the the Rx the same

## 2022-06-03 ENCOUNTER — Encounter: Payer: Self-pay | Admitting: Internal Medicine

## 2022-06-08 NOTE — Patient Instructions (Addendum)
Seen recently at [facility] by Dr. Harlow Asa. Records reviewed.  She will be treated intermittently with prednisone for flareups of acute arthritis.  Consideration will be given to gout treatment.  She will continue torsemide for dependent edema.

## 2022-07-04 ENCOUNTER — Other Ambulatory Visit: Payer: Self-pay | Admitting: Internal Medicine

## 2022-07-21 ENCOUNTER — Other Ambulatory Visit: Payer: Self-pay | Admitting: Internal Medicine

## 2022-08-14 ENCOUNTER — Telehealth: Payer: Medicare Other | Admitting: Physician Assistant

## 2022-08-14 ENCOUNTER — Telehealth: Payer: Self-pay | Admitting: Internal Medicine

## 2022-08-14 DIAGNOSIS — B9689 Other specified bacterial agents as the cause of diseases classified elsewhere: Secondary | ICD-10-CM

## 2022-08-14 DIAGNOSIS — J019 Acute sinusitis, unspecified: Secondary | ICD-10-CM | POA: Diagnosis not present

## 2022-08-14 MED ORDER — BENZONATATE 100 MG PO CAPS
100.0000 mg | ORAL_CAPSULE | Freq: Three times a day (TID) | ORAL | 0 refills | Status: DC | PRN
Start: 2022-08-14 — End: 2022-09-04

## 2022-08-14 MED ORDER — AZITHROMYCIN 250 MG PO TABS
ORAL_TABLET | ORAL | 0 refills | Status: AC
Start: 2022-08-14 — End: 2022-08-19

## 2022-08-14 NOTE — Telephone Encounter (Signed)
[name] [phone]  [name] called to say she thinks she has sinus infection and she wanted to be seen tomorrow. I let her know Dr Lenord Fellers was out of office this week, she them wanted to wait and be seen next week, I let her know best thing to do was to go ahead and have Mychart video visit so she could go ahead and get started on some medication, and call next week if not better, that I was not scheduling first of the week yet.

## 2022-08-14 NOTE — Progress Notes (Signed)
Virtual Visit Consent   [name], you are scheduled for a virtual visit with a [facility] provider today. Just as with appointments in the office, your consent must be obtained to participate. Your consent will be active for this visit and any virtual visit you may have with one of our providers in the next 365 days. If you have a MyChart account, a copy of this consent can be sent to you electronically.  As this is a virtual visit, video technology does not allow for your provider to perform a traditional examination. This may limit your provider's ability to fully assess your condition. If your provider identifies any concerns that need to be evaluated in person or the need to arrange testing (such as labs, EKG, etc.), we will make arrangements to do so. Although advances in technology are sophisticated, we cannot ensure that it will always work on either your end or our end. If the connection with a video visit is poor, the visit may have to be switched to a telephone visit. With either a video or telephone visit, we are not always able to ensure that we have a secure connection.  By engaging in this virtual visit, you consent to the provision of healthcare and authorize for your insurance to be billed (if applicable) for the services provided during this visit. Depending on your insurance coverage, you may receive a charge related to this service.  I need to obtain your verbal consent now. Are you willing to proceed with your visit today? [name] has provided verbal consent on 08/14/2022 for a virtual visit (video or telephone). [name], [place]  Date: 08/14/2022 9:54 AM  Virtual Visit via Video Note   I, [name], connected with  ITATY STRENGTH  ([id number], April 12, 1945) on 08/14/22 at  9:45 AM EDT by a video-enabled telemedicine application and verified that I am speaking with the correct person using two identifiers.  Location: Patient: Virtual Visit Location  Patient: Home Provider: Virtual Visit Location Provider: Home Office   I discussed the limitations of evaluation and management by telemedicine and the availability of in person appointments. The patient expressed understanding and agreed to proceed.    History of Present Illness: [name] is a 77 y.o. who identifies as a female who was assigned female at birth, and is being seen today for possible sinusitis. Patient endorses symptoms starting in the past week with noting substantial nasal congestion with sinus pressure, post-nasal drainage, headache. Denies fever. Notes substantial coughing spells due to the drainage. Now also noting facial pain and frontal sinus pain R>L. Also now with R maxillary sinus pain.   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   Bilateral tinnitus 01/09/2016   Age-related nuclear cataract of both eyes 09/21/2015   Choroidal nevus, left eye 09/21/2015   Chronic dryness of both eyes 09/21/2015   Dermatochalasis of eyelid 09/21/2015   Presbyopia of both eyes 09/21/2015   PVD (posterior vitreous detachment), both eyes 09/21/2015   Regular astigmatism of both eyes 09/21/2015   Lateral cystocele 07/23/2015   Rectocele 07/23/2015   Osteoarthritis of right hip 05/24/2012   GE  reflux 09/23/2011   Hypothyroidism 01/07/2011   Anxiety 01/07/2011   Allergic rhinitis 01/07/2011   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    Allergies:  Allergies  Allergen Reactions   Betadine [Povidone Iodine] Rash and Other (See Comments)    blisters   Penicillins Anaphylaxis   Levofloxacin Other (See  Comments)    Muscle weakness, couldn't move well   Doxycycline Rash    Rash on arms and palms    Medications:  Current Outpatient Medications:    azithromycin (ZITHROMAX) 250 MG tablet, Take 2 tablets on day 1, then 1 tablet daily on days 2 through 5, Disp: 6 tablet, Rfl: 0   benzonatate (TESSALON) 100 MG capsule, Take 1 capsule (100 mg total) by mouth 3 (three) times daily as needed for cough., Disp: 30 capsule, Rfl: 0   ALPRAZolam (XANAX) 0.5 MG tablet, TAKE 1 TABLET BY MOUTH EACH NIGHT AT BEDTIME AS NEEDED SLEEP, Disp: 30 tablet, Rfl: 5   EPINEPHrine 0.3 mg/0.3 mL IJ SOAJ injection, , Disp: , Rfl:    glucosamine-chondroitin 500-400 MG tablet, Take 1 tablet by mouth 2 (two) times daily., Disp: , Rfl:    inFLIXimab in sodium chloride 0.9 %, Inject into the vein., Disp: , Rfl:    Magnesium 400 MG CAPS, Take by mouth., Disp: , Rfl:    Multiple Vitamin (MULTIVITAMIN) tablet, Take 1 tablet by mouth daily., Disp: , Rfl:    Multiple Vitamins-Minerals (PRESERVISION AREDS 2 PO), Take by mouth., Disp: , Rfl:    OVER THE COUNTER MEDICATION, Vitamin D 3  One daily, Disp: , Rfl:    potassium chloride SA (KLOR-CON M) 20 MEQ tablet, Take 1 tablet (20 mEq total) by mouth daily., Disp: 90 tablet, Rfl: 3   rOPINIRole (REQUIP) 0.5 MG tablet, TAKE 1 TABLET BY MOUTH 3 TIMES DAILY, Disp: 90 tablet, Rfl: 5   SYNTHROID 88 MCG tablet, TAKE 1 TABLET BY MOUTH DAILY BEFORE BREAKFAST, Disp: 90 tablet, Rfl: 2   torsemide (DEMADEX) 20 MG tablet, Take 1 tablet (20 mg total) by mouth daily., Disp: 90 tablet, Rfl: 3  Current Facility-Administered Medications:    clotrimazole-betamethasone (LOTRISONE) cream, , Topical, BID, Baxley, Luanna Cole, MD  Observations/Objective: Patient is well-developed, well-nourished in no acute distress.  Resting comfortably at home.  Head is normocephalic, atraumatic.  No labored breathing. Speech is clear and coherent with logical content.  Patient is alert and oriented at baseline.    Assessment and Plan: 1. Acute bacterial sinusitis - azithromycin (ZITHROMAX) 250 MG tablet; Take 2 tablets on day 1, then 1 tablet daily on days 2 through 5  Dispense: 6 tablet; Refill: 0 - benzonatate (TESSALON) 100 MG capsule; Take 1 capsule (100 mg total) by mouth 3 (three) times daily as needed for cough.  Dispense: 30 capsule; Refill: 0  Rx Azithromycin.  Increase fluids.  Rest.  Saline nasal spray.  Probiotic.  Mucinex as directed.  Humidifier in bedroom. Tessalon per orders.  Call or return to clinic if symptoms are not improving.   Follow Up Instructions: I discussed the assessment and treatment plan with the patient. The patient was provided an opportunity to ask questions and all were answered. The patient agreed with the plan and demonstrated an understanding of the instructions.  A copy of instructions were sent to the patient via MyChart unless otherwise noted below.   The patient was advised to call back or seek an in-person evaluation if the symptoms worsen or if the condition fails to improve as anticipated.  Time:  I spent 10 minutes with the patient via telehealth technology discussing the above problems/concerns.    [name], PA-C

## 2022-08-14 NOTE — Patient Instructions (Signed)
Vanessa [facility], thank you for joining [name], PA-C for today's virtual visit.  While this provider is not your primary care provider (PCP), if your PCP is located in our provider database this encounter information will be shared with them immediately following your visit.   A [facility] MyChart account gives you access to today's visit and all your visits, tests, and labs performed at [facility] " click here if you don't have a [facility] MyChart account or go to [email or web address]  Consent: (Patient) MILIAN CRUMPTON provided verbal consent for this virtual visit at the beginning of the encounter.  Current Medications:  Current Outpatient Medications:    ALPRAZolam (XANAX) 0.5 MG tablet, TAKE 1 TABLET BY MOUTH EACH NIGHT AT BEDTIME AS NEEDED SLEEP, Disp: 30 tablet, Rfl: 5   EPINEPHrine 0.3 mg/0.3 mL IJ SOAJ injection, , Disp: , Rfl:    glucosamine-chondroitin 500-400 MG tablet, Take 1 tablet by mouth 2 (two) times daily., Disp: , Rfl:    inFLIXimab in sodium chloride 0.9 %, Inject into the vein., Disp: , Rfl:    Magnesium 400 MG CAPS, Take by mouth., Disp: , Rfl:    Multiple Vitamin (MULTIVITAMIN) tablet, Take 1 tablet by mouth daily., Disp: , Rfl:    Multiple Vitamins-Minerals (PRESERVISION AREDS 2 PO), Take by mouth., Disp: , Rfl:    OVER THE COUNTER MEDICATION, Vitamin D 3  One daily, Disp: , Rfl:    potassium chloride SA (KLOR-CON M) 20 MEQ tablet, Take 1 tablet (20 mEq total) by mouth daily., Disp: 90 tablet, Rfl: 3   rOPINIRole (REQUIP) 0.5 MG tablet, TAKE 1 TABLET BY MOUTH 3 TIMES DAILY, Disp: 90 tablet, Rfl: 5   SYNTHROID 88 MCG tablet, TAKE 1 TABLET BY MOUTH DAILY BEFORE BREAKFAST, Disp: 90 tablet, Rfl: 2   torsemide (DEMADEX) 20 MG tablet, Take 1 tablet (20 mg total) by mouth daily., Disp: 90 tablet, Rfl: 3  Current Facility-Administered Medications:    clotrimazole-betamethasone (LOTRISONE) cream, , Topical, BID, Baxley, Luanna Cole, MD   Medications  ordered in this encounter:  No orders of the defined types were placed in this encounter.    *If you need refills on other medications prior to your next appointment, please contact your pharmacy*  Follow-Up: Call back or seek an in-person evaluation if the symptoms worsen or if the condition fails to improve as anticipated.  [facility] [phone]  Other Instructions Please take antibiotic as directed.  Increase fluid intake.  Use Saline nasal spray.  Take a daily multivitamin. Ok to continue your OTC medications. Add on the Tessalon as directed, for cough.  Place a humidifier in the bedroom.  Please call or return clinic if symptoms are not improving.  Sinusitis Sinusitis is redness, soreness, and swelling (inflammation) of the paranasal sinuses. Paranasal sinuses are air pockets within the bones of your face (beneath the eyes, the middle of the forehead, or above the eyes). In healthy paranasal sinuses, mucus is able to drain out, and air is able to circulate through them by way of your nose. However, when your paranasal sinuses are inflamed, mucus and air can become trapped. This can allow bacteria and other germs to grow and cause infection. Sinusitis can develop quickly and last only a short time (acute) or continue over a long period (chronic). Sinusitis that lasts for more than 12 weeks is considered chronic.  CAUSES  Causes of sinusitis include: Allergies. Structural abnormalities, such as displacement of the cartilage that  separates your nostrils (deviated septum), which can decrease the air flow through your nose and sinuses and affect sinus drainage. Functional abnormalities, such as when the small hairs (cilia) that line your sinuses and help remove mucus do not work properly or are not present. SYMPTOMS  Symptoms of acute and chronic sinusitis are the same. The primary symptoms are pain and pressure around the affected sinuses. Other symptoms include: Upper  toothache. Earache. Headache. Bad breath. Decreased sense of smell and taste. A cough, which worsens when you are lying flat. Fatigue. Fever. Thick drainage from your nose, which often is green and may contain pus (purulent). Swelling and warmth over the affected sinuses. DIAGNOSIS  Your caregiver will perform a physical exam. During the exam, your caregiver may: Look in your nose for signs of abnormal growths in your nostrils (nasal polyps). Tap over the affected sinus to check for signs of infection. View the inside of your sinuses (endoscopy) with a special imaging device with a light attached (endoscope), which is inserted into your sinuses. If your caregiver suspects that you have chronic sinusitis, one or more of the following tests may be recommended: Allergy tests. Nasal culture A sample of mucus is taken from your nose and sent to a lab and screened for bacteria. Nasal cytology A sample of mucus is taken from your nose and examined by your caregiver to determine if your sinusitis is related to an allergy. TREATMENT  Most cases of acute sinusitis are related to a viral infection and will resolve on their own within 10 days. Sometimes medicines are prescribed to help relieve symptoms (pain medicine, decongestants, nasal steroid sprays, or saline sprays).  However, for sinusitis related to a bacterial infection, your caregiver will prescribe antibiotic medicines. These are medicines that will help kill the bacteria causing the infection.  Rarely, sinusitis is caused by a fungal infection. In theses cases, your caregiver will prescribe antifungal medicine. For some cases of chronic sinusitis, surgery is needed. Generally, these are cases in which sinusitis recurs more than 3 times per year, despite other treatments. HOME CARE INSTRUCTIONS  Drink plenty of water. Water helps thin the mucus so your sinuses can drain more easily. Use a humidifier. Inhale steam 3 to 4 times a day (for  example, sit in the bathroom with the shower running). Apply a warm, moist washcloth to your face 3 to 4 times a day, or as directed by your caregiver. Use saline nasal sprays to help moisten and clean your sinuses. Take over-the-counter or prescription medicines for pain, discomfort, or fever only as directed by your caregiver. SEEK IMMEDIATE MEDICAL CARE IF: You have increasing pain or severe headaches. You have nausea, vomiting, or drowsiness. You have swelling around your face. You have vision problems. You have a stiff neck. You have difficulty breathing. MAKE SURE YOU:  Understand these instructions. Will watch your condition. Will get help right away if you are not doing well or get worse. Document Released: 03/03/2005 Document Revised: 05/26/2011 Document Reviewed: 03/18/2011 [facility] Patient Information 2014 [place], [place].    If you have been instructed to have an in-person evaluation today at a local Urgent Care facility, please use the link below. It will take you to a list of all of our available [facility], including address, phone number and hours of operation. Please do not delay care.  [facility]  If you or a family member do not have a primary care provider, use the link below to schedule  a visit and establish care. When you choose a [facility] primary care physician or advanced practice provider, you gain a long-term partner in health. Find a Primary Care Provider  Learn more about [facility] in-office and virtual care options: [facility] - Get Care Now

## 2022-08-28 NOTE — Progress Notes (Signed)
Patient Care Team: [name], MD as PCP - General (Internal Medicine) Jodelle Red, MD as PCP - Cardiology (Cardiology)  Visit Date: 09/04/22  Subjective:    Patient ID: [name] , Female   DOB: 1945-11-15, 77 y.o.    MRN: [id number]   77 y.o. Female presents today for a 6 month follow-up.  History of hypothyroidism treated with levothyroxine 88 mg daily before breakfast. TSH at 9.38. Has not missed any doses. No associated symptoms.  History of hyperlipidemia. Declines statin medication. CHOL elevated at 243. LDL elevated at 131.  History of anxiety treated with alprazolam 0.5 mg at bedtime.   History of restless leg syndrome treated with Requip 0.5 mg three times daily.   Previously diagnosed by [facility], Dr. Nickola Major, with inflammatory arthropathy and psoriatic arthritis. Was treated with Simponi for a period of time.Tried other meds as well.   In February, saw Dr. Dierdre Harness At [facility]. She had not felt well on DMARD for some time. He told her she could just take short taper of steroids if flares only occurred no more than 4 times a year.he has diagnosed her with remitting seronegative symmetric synovitis.  He also thinks she has gout as she was found to have an elevated uric acid level.  He plans to treat this intermittently with prednisone and will give consideration to uric acid lowering therapy depending on how she progresses.  She is very pleased with this plan and has felt well until recently when she had a slight flare.   She developed dependent edema in 2020.  She also saw Dr. Cristal Deer, Cardiologist. Taking Demadex for LE edema.  Compression stockings were recommended by vascular surgeon for chronic venous insufficiency but she was not thought to have peripheral vascular disease.  Compression stockings did not seem to help.  She was found to have an elevated eosinophil count at  and this has always been normal over the  previous years.  Patient noted edema got better with steroids.  Her sed rate was 11. Has only had one arthritis flare-up recently.   Status post right hip arthroplasty by Dr. Magnus Ivan in 2014.  She is allergic to Penicillin-it causes anaphylaxis.  History of GE reflux, allergic rhinitis, neuropathy of right foot and anxiety.  Benign left breast biopsy 1972, lumbar disc surgery L4 1999.  Lumbar disc surgery at L5 1991.  Cholecystectomy 2004.  Colonoscopy initially done in 2007 with 10-year follow-up recommended.  Glucose normal. Kidney, liver functions normal. Electrolytes normal. Blood proteins normal. CO2 elevated at 35.  Social history: Her husband is a retired Best boy and is a Pharmacist, hospital [employer].  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  She does not smoke.  Social alcohol consumption.  She lost a stepson to suicide in 2018.  She has 2 adult daughters from a previous marriage.  Family history: 1 brother died at age 61 of congestive heart failure secondary to rheumatic fever complications.  1 sister with hypertension and obesity.  Another sister with hypertension and obesity.  1 brother living.  Mother deceased with history of neurological problems and heart disease.  Past Medical History:  Diagnosis Date   Allergic bronchitis    Allergic bronchitis    Allergy    Arthritis    Claustrophobia    Dependent edema    GERD (gastroesophageal reflux disease)    occas problem - burning, cough would take prilosec  if needed   H/O hiatal hernia    HSV-2 (herpes simplex virus 2) infection    Hypothyroidism    Peripheral neuropathy    3 toes right foot 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 problem   Psoriatic arthritis (HCC) 02/18/2019   Thyroid disease      Family History  Problem Relation Age of Onset   Heart disease Father    Hypertension Sister    Heart disease Brother    Asthma Daughter    Hypertension Sister     Social  History   Social History Narrative   Not on file      Review of Systems  Constitutional:  Negative for fever and malaise/fatigue.  HENT:  Negative for congestion.   Eyes:  Negative for blurred vision.  Respiratory:  Negative for cough and shortness of breath.   Cardiovascular:  Negative for chest pain, palpitations and leg swelling.  Gastrointestinal:  Negative for vomiting.  Musculoskeletal:  Negative for back pain.  Skin:  Negative for rash.  Neurological:  Negative for loss of consciousness and headaches.        Objective:   Vitals: BP 136/72   Pulse 61   Temp 97.8 F (36.6 C) (Tympanic)   Resp 16   Ht 5\' 3"  (1.6 m)   Wt 149 lb 8 oz (67.8 kg)   SpO2 99%   BMI 26.48 kg/m    Physical Exam Vitals and nursing note reviewed.  Constitutional:      General: She is not in acute distress.    Appearance: Normal appearance. She is not toxic-appearing.  HENT:     Head: Normocephalic and atraumatic.  Cardiovascular:     Rate and Rhythm: Normal rate and regular rhythm. No extrasystoles are present.    Pulses: Normal pulses.     Heart sounds: Normal heart sounds. No murmur heard.    No friction rub. No gallop.  Pulmonary:     Effort: Pulmonary effort is normal. No respiratory distress.     Breath sounds: Normal breath sounds. No wheezing or rales.  Skin:    General: Skin is warm and dry.  Neurological:     Mental Status: She is alert and oriented to person, place, and time. Mental status is at baseline.  Psychiatric:        Mood and Affect: Mood normal.        Behavior: Behavior normal.        Thought Content: Thought content normal.        Judgment: Judgment normal.       Results:   Studies obtained and personally reviewed by me:   Labs:       Component Value Date/Time   NA 141 09/02/2022 0955   K 4.4 09/02/2022 0955   CL 100 09/02/2022 0955   CO2 35 (H) 09/02/2022 0955   GLUCOSE 90 09/02/2022 0955   BUN 16 09/02/2022 0955   CREATININE 0.71  09/02/2022 0955   CALCIUM 9.6 09/02/2022 0955   PROT 7.2 09/02/2022 0955   ALBUMIN 4.4 01/27/2019 1039   AST 22 09/02/2022 0955   AST 18 01/27/2019 1039   ALT 20 09/02/2022 0955   ALT 13 01/27/2019 1039   ALKPHOS 82 01/27/2019 1039   BILITOT 0.8 09/02/2022 0955   BILITOT 0.7 01/27/2019 1039   GFRNONAA 74 01/17/2020 1144   GFRAA 86 01/17/2020 1144     Lab Results  Component Value Date   WBC 8.5 02/28/2022   HGB 14.3 02/28/2022  HCT 41.2 02/28/2022   MCV 93.4 02/28/2022   PLT 318 02/28/2022    Lab Results  Component Value Date   CHOL 243 (H) 09/02/2022   HDL 87 09/02/2022   LDLCALC 131 (H) 09/02/2022   TRIG 135 09/02/2022   CHOLHDL 2.8 09/02/2022    No results found for: "HGBA1C"   Lab Results  Component Value Date   TSH 9.38 (H) 09/02/2022      Assessment & Plan:   Hypothyroidism: treated with levothyroxine 88 mg daily before breakfast. TSH elevated at 9.38. Has not missed any doses.  She does take medication on an empty stomach and with no other medications or food.  No associated symptoms. Ordered repeat TSH today and will advise further based on that result.  Hyperlipidemia: declines statin medication. CHOL elevated at 243. LDL elevated at 131.  Anxiety: stable with alprazolam 0.5 mg at bedtime.   Restless leg syndrome: treated with Requip 0.5 mg three times daily.  Remitting seronegative symmetric synovitis (RS3PE)  Dependent edema treated with Demadex 20 mg daily  Return in 6 months for health maintenance exam or as needed.  Will check TSH that was drawn today and advise further regarding thyroid replacement medication based on results obtained with recheck.    I,Alexander Ruley,acting as a Neurosurgeon for [name], MD.,have documented all relevant documentation on the behalf of [name], MD,as directed by  [name], MD while in the presence of [name], MD.   I, [name], MD, have reviewed all documentation for this visit. The  documentation on 09/04/22 for the exam, diagnosis, procedures, and orders are all accurate and complete.

## 2022-09-02 ENCOUNTER — Other Ambulatory Visit: Payer: Medicare Other

## 2022-09-02 DIAGNOSIS — E7800 Pure hypercholesterolemia, unspecified: Secondary | ICD-10-CM

## 2022-09-02 DIAGNOSIS — R609 Edema, unspecified: Secondary | ICD-10-CM | POA: Diagnosis not present

## 2022-09-02 DIAGNOSIS — E039 Hypothyroidism, unspecified: Secondary | ICD-10-CM

## 2022-09-02 LAB — COMPLETE METABOLIC PANEL WITH GFR
Alkaline phosphatase (APISO): 99 U/L (ref 37–153)
CO2: 35 mmol/L — ABNORMAL HIGH (ref 20–32)
Calcium: 9.6 mg/dL (ref 8.6–10.4)
Glucose, Bld: 90 mg/dL (ref 65–99)
Sodium: 141 mmol/L (ref 135–146)

## 2022-09-02 LAB — LIPID PANEL
Non-HDL Cholesterol (Calc): 156 mg/dL (calc) — ABNORMAL HIGH (ref <130)
Total CHOL/HDL Ratio: 2.8 (calc) (ref <5.0)

## 2022-09-03 LAB — LIPID PANEL
Cholesterol: 243 mg/dL — ABNORMAL HIGH (ref <200)
HDL: 87 mg/dL (ref >=50)
LDL Cholesterol (Calc): 131 mg/dL (calc) — ABNORMAL HIGH
Triglycerides: 135 mg/dL (ref <150)

## 2022-09-03 LAB — COMPLETE METABOLIC PANEL WITH GFR
AG Ratio: 1.7 (calc) (ref 1.0–2.5)
ALT: 20 U/L (ref 6–29)
AST: 22 U/L (ref 10–35)
Albumin: 4.5 g/dL (ref 3.6–5.1)
BUN: 16 mg/dL (ref 7–25)
Chloride: 100 mmol/L (ref 98–110)
Creat: 0.71 mg/dL (ref 0.60–1.00)
Globulin: 2.7 g/dL (calc) (ref 1.9–3.7)
Potassium: 4.4 mmol/L (ref 3.5–5.3)
Total Bilirubin: 0.8 mg/dL (ref 0.2–1.2)
Total Protein: 7.2 g/dL (ref 6.1–8.1)
eGFR: 86 mL/min/{1.73_m2} (ref >=60)

## 2022-09-03 LAB — TSH: TSH: 9.38 mIU/L — ABNORMAL HIGH (ref 0.40–4.50)

## 2022-09-04 ENCOUNTER — Other Ambulatory Visit: Payer: Medicare Other

## 2022-09-04 ENCOUNTER — Encounter: Payer: Self-pay | Admitting: Internal Medicine

## 2022-09-04 ENCOUNTER — Ambulatory Visit (INDEPENDENT_AMBULATORY_CARE_PROVIDER_SITE_OTHER): Payer: Medicare Other | Admitting: Internal Medicine

## 2022-09-04 VITALS — BP 136/72 | HR 61 | Temp 97.8°F | Resp 16 | Ht 63.0 in | Wt 149.5 lb

## 2022-09-04 DIAGNOSIS — R609 Edema, unspecified: Secondary | ICD-10-CM | POA: Diagnosis not present

## 2022-09-04 DIAGNOSIS — R7989 Other specified abnormal findings of blood chemistry: Secondary | ICD-10-CM

## 2022-09-04 DIAGNOSIS — M6588 Other synovitis and tenosynovitis, other site: Secondary | ICD-10-CM

## 2022-09-04 DIAGNOSIS — E7800 Pure hypercholesterolemia, unspecified: Secondary | ICD-10-CM | POA: Diagnosis not present

## 2022-09-04 DIAGNOSIS — E039 Hypothyroidism, unspecified: Secondary | ICD-10-CM | POA: Diagnosis not present

## 2022-09-04 NOTE — Patient Instructions (Signed)
She gets brand-name Synthroid so not sure why TSH is elevated.  She takes medication on an empty stomach and takes it regularly.  She says she has not missed a dose.  TSH was repeated today to exclude a possible lab error and will make further recommendations based on results obtained today.

## 2022-09-05 ENCOUNTER — Other Ambulatory Visit: Payer: Self-pay

## 2022-09-05 ENCOUNTER — Telehealth: Payer: Self-pay

## 2022-09-05 LAB — TSH: TSH: 8.11 mIU/L — ABNORMAL HIGH (ref 0.40–4.50)

## 2022-09-05 MED ORDER — SYNTHROID 100 MCG PO TABS: 100.0000 ug | ORAL_TABLET | Freq: Every day | ORAL | 0 refills | Status: DC

## 2022-09-05 NOTE — Telephone Encounter (Signed)
Needs new dose of levothyroxine sent to [id number] - [place], [place] - [address] [[id number]]

## 2022-09-05 NOTE — Telephone Encounter (Signed)
100 mcg sent in per Dr Lenord Fellers.

## 2022-11-01 ENCOUNTER — Encounter: Payer: Self-pay | Admitting: Internal Medicine

## 2022-11-03 NOTE — Telephone Encounter (Signed)
Patient called and scheduled lab appointment for Tuesday for TSH

## 2022-11-04 ENCOUNTER — Other Ambulatory Visit: Payer: Medicare Other

## 2022-11-04 DIAGNOSIS — E039 Hypothyroidism, unspecified: Secondary | ICD-10-CM

## 2022-11-05 ENCOUNTER — Encounter: Payer: Self-pay | Admitting: Internal Medicine

## 2022-11-05 ENCOUNTER — Telehealth (INDEPENDENT_AMBULATORY_CARE_PROVIDER_SITE_OTHER): Payer: Medicare Other | Admitting: Internal Medicine

## 2022-11-05 VITALS — Ht 63.0 in | Wt 149.0 lb

## 2022-11-05 DIAGNOSIS — R7989 Other specified abnormal findings of blood chemistry: Secondary | ICD-10-CM | POA: Diagnosis not present

## 2022-11-05 DIAGNOSIS — E039 Hypothyroidism, unspecified: Secondary | ICD-10-CM | POA: Diagnosis not present

## 2022-11-05 DIAGNOSIS — R609 Edema, unspecified: Secondary | ICD-10-CM | POA: Diagnosis not present

## 2022-11-05 DIAGNOSIS — M6588 Other synovitis and tenosynovitis, other site: Secondary | ICD-10-CM

## 2022-11-05 LAB — TSH: TSH: 8.72 m[IU]/L — ABNORMAL HIGH (ref 0.40–4.50)

## 2022-11-05 MED ORDER — LEVOTHYROXINE SODIUM 125 MCG PO TABS: 125.0000 ug | ORAL_TABLET | Freq: Every day | ORAL | 0 refills | Status: DC

## 2022-11-05 NOTE — Progress Notes (Unsigned)
   Subjective:    Patient ID: [name], female    DOB: 12/20/1945, 77 y.o.   MRN: [id number]  HPI 76 year old Female with a longstanding history of hypothyroidism messaged me recently stating she would like to have thyroid function checked.  Patient is followed by Rheumatologist, Dr. Casilda Carls at [facility].  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  She was concerned that her thyroid might be underactive and that could be contributing to her pain.  Dr. Casilda Carls sent in a prednisone taper for her in July for musculoskeletal pain.  She is concerned that her thyroid medication dosage may not be correct at this point in time.    We had her come in for a TSH on August 20 and indeed her TSH is elevated 8.72.  It was 8.11 and 9.38 in June and her thyroid medication dose was increased at that time.  She is on brand-name Synthroid.  It seems that she may not be absorbing her thyroid medication.  I know she is compliant with it on a daily basis.  Has been on levothyroxine 0.112 mg daily  She is seen today via interactive audio and video telecommunications.  She is at her home and I am at my office.  She is identified using 2 identifiers as Sherry Wyman. Mason, a longstanding patient in this practice.  Review of Systems     Objective:   Physical Exam  She is seen virtually in no acute distress but is having considerable musculoskeletal pain and will be seen at [facility] later this week.      Assessment & Plan:  Musculoskeletal pain  Hypothyroidism  TSH drawn on August 20 is 8.72 and was 8.11 in June despite a change in medication dosage.  We are going to increase Synthroid to 125 mcg daily.  Brand-name has been requested.  She will be seen at [facility] by rheumatologist later this week.  We can recheck TSH in about 6 weeks.  We can also consider Endocrinology  consultation.  Patient says she is taking thyroid medication on an empty stomach.

## 2022-11-05 NOTE — Patient Instructions (Signed)
Increase thyroid replacement med dose to 125 mcg daily and follow up in 6 weeks. She is going to see Rheumatologist soon at [facility] and will inquire if there is any correlation with hypothyroidism and her Rheumatology condition.

## 2022-11-06 DIAGNOSIS — M159 Polyosteoarthritis, unspecified: Secondary | ICD-10-CM | POA: Diagnosis not present

## 2022-11-06 DIAGNOSIS — Z1159 Encounter for screening for other viral diseases: Secondary | ICD-10-CM | POA: Diagnosis not present

## 2022-11-06 DIAGNOSIS — M1990 Unspecified osteoarthritis, unspecified site: Secondary | ICD-10-CM | POA: Diagnosis not present

## 2022-11-06 DIAGNOSIS — Z79899 Other long term (current) drug therapy: Secondary | ICD-10-CM | POA: Diagnosis not present

## 2022-11-07 ENCOUNTER — Encounter: Payer: Self-pay | Admitting: Internal Medicine

## 2022-11-09 IMAGING — CR DG CHEST 2V
2 series · 2 of 2 positions shown · non-contrast
Comparison: None.

CLINICAL DATA: Cough

EXAM:
CHEST - 2 VIEW

[w chest pa]
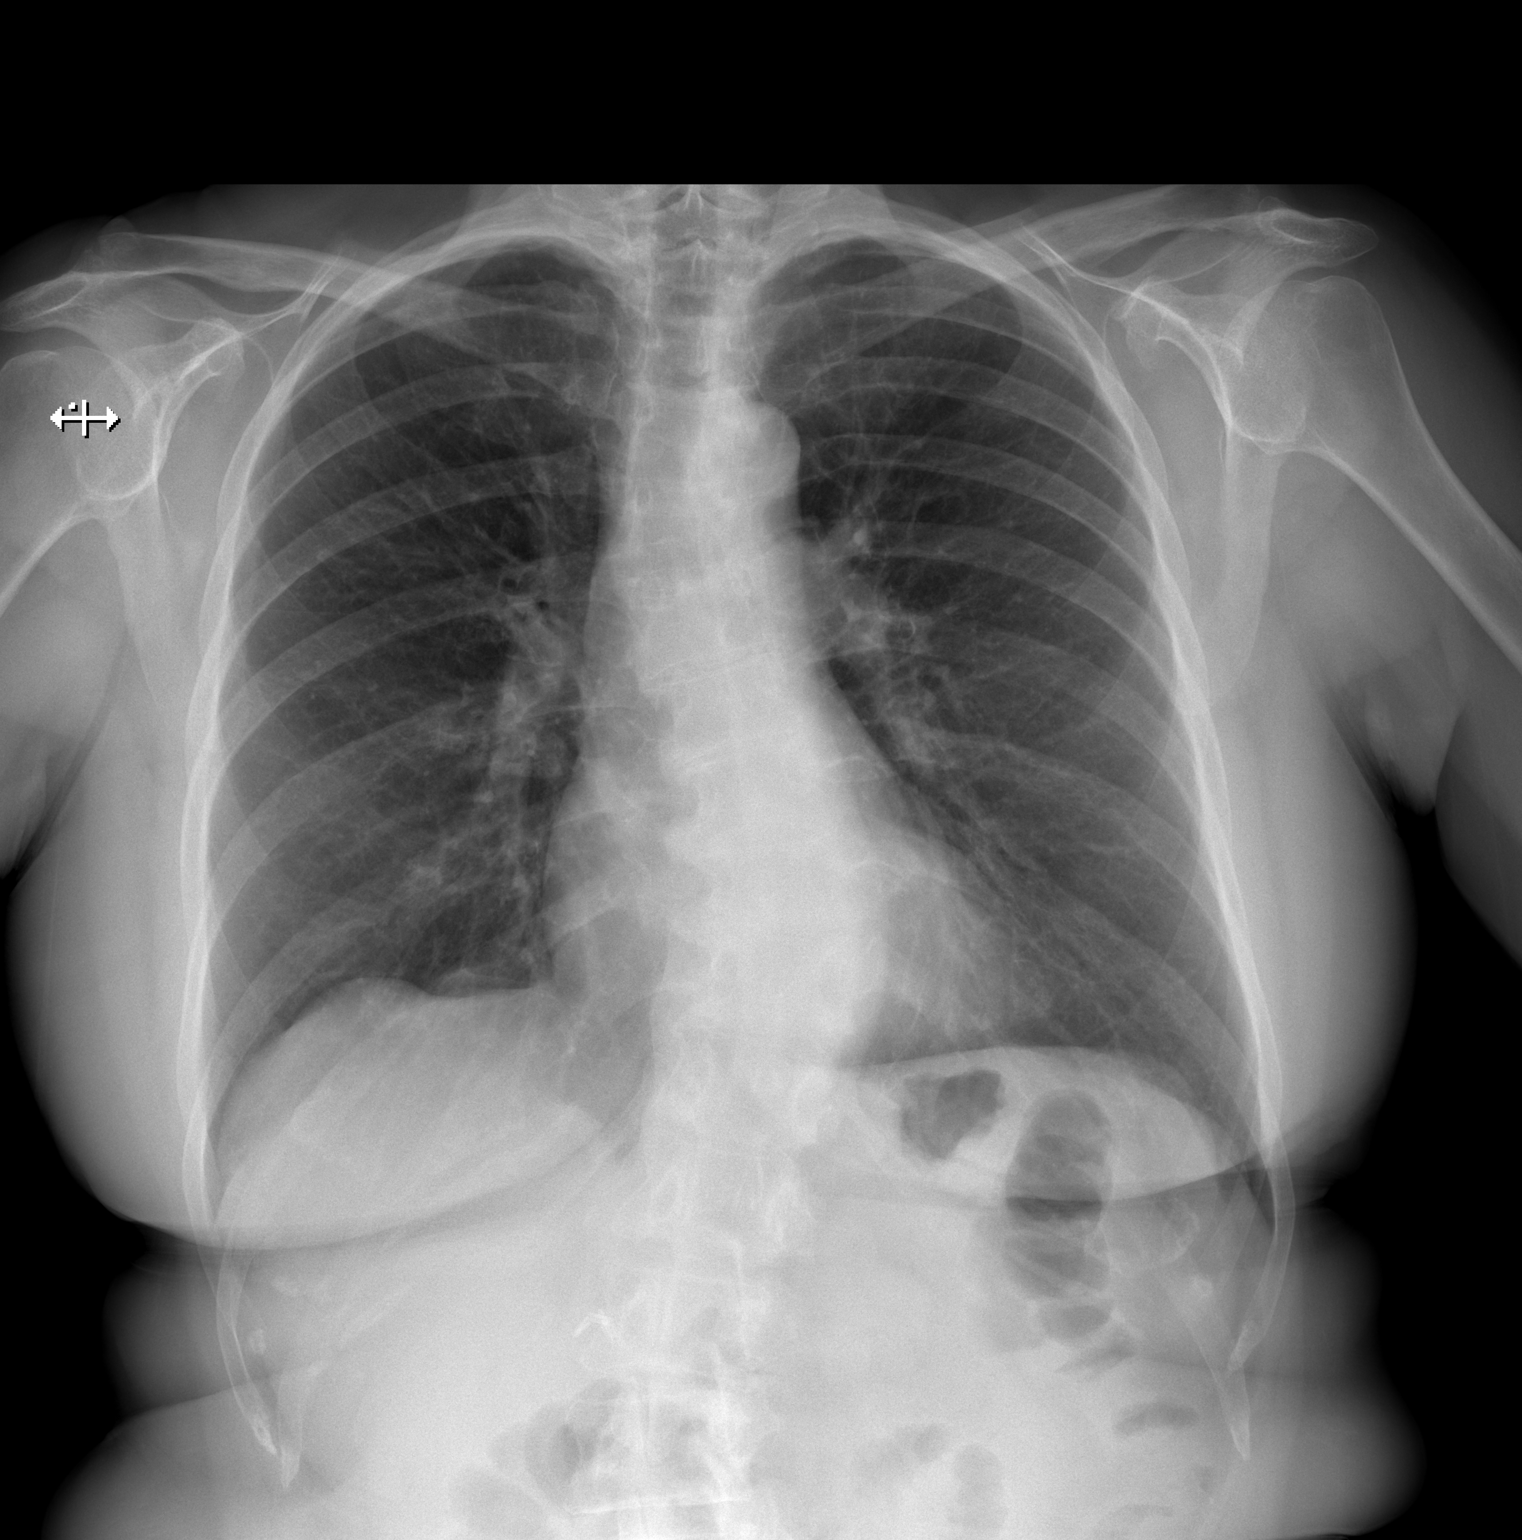

[w chest lat]
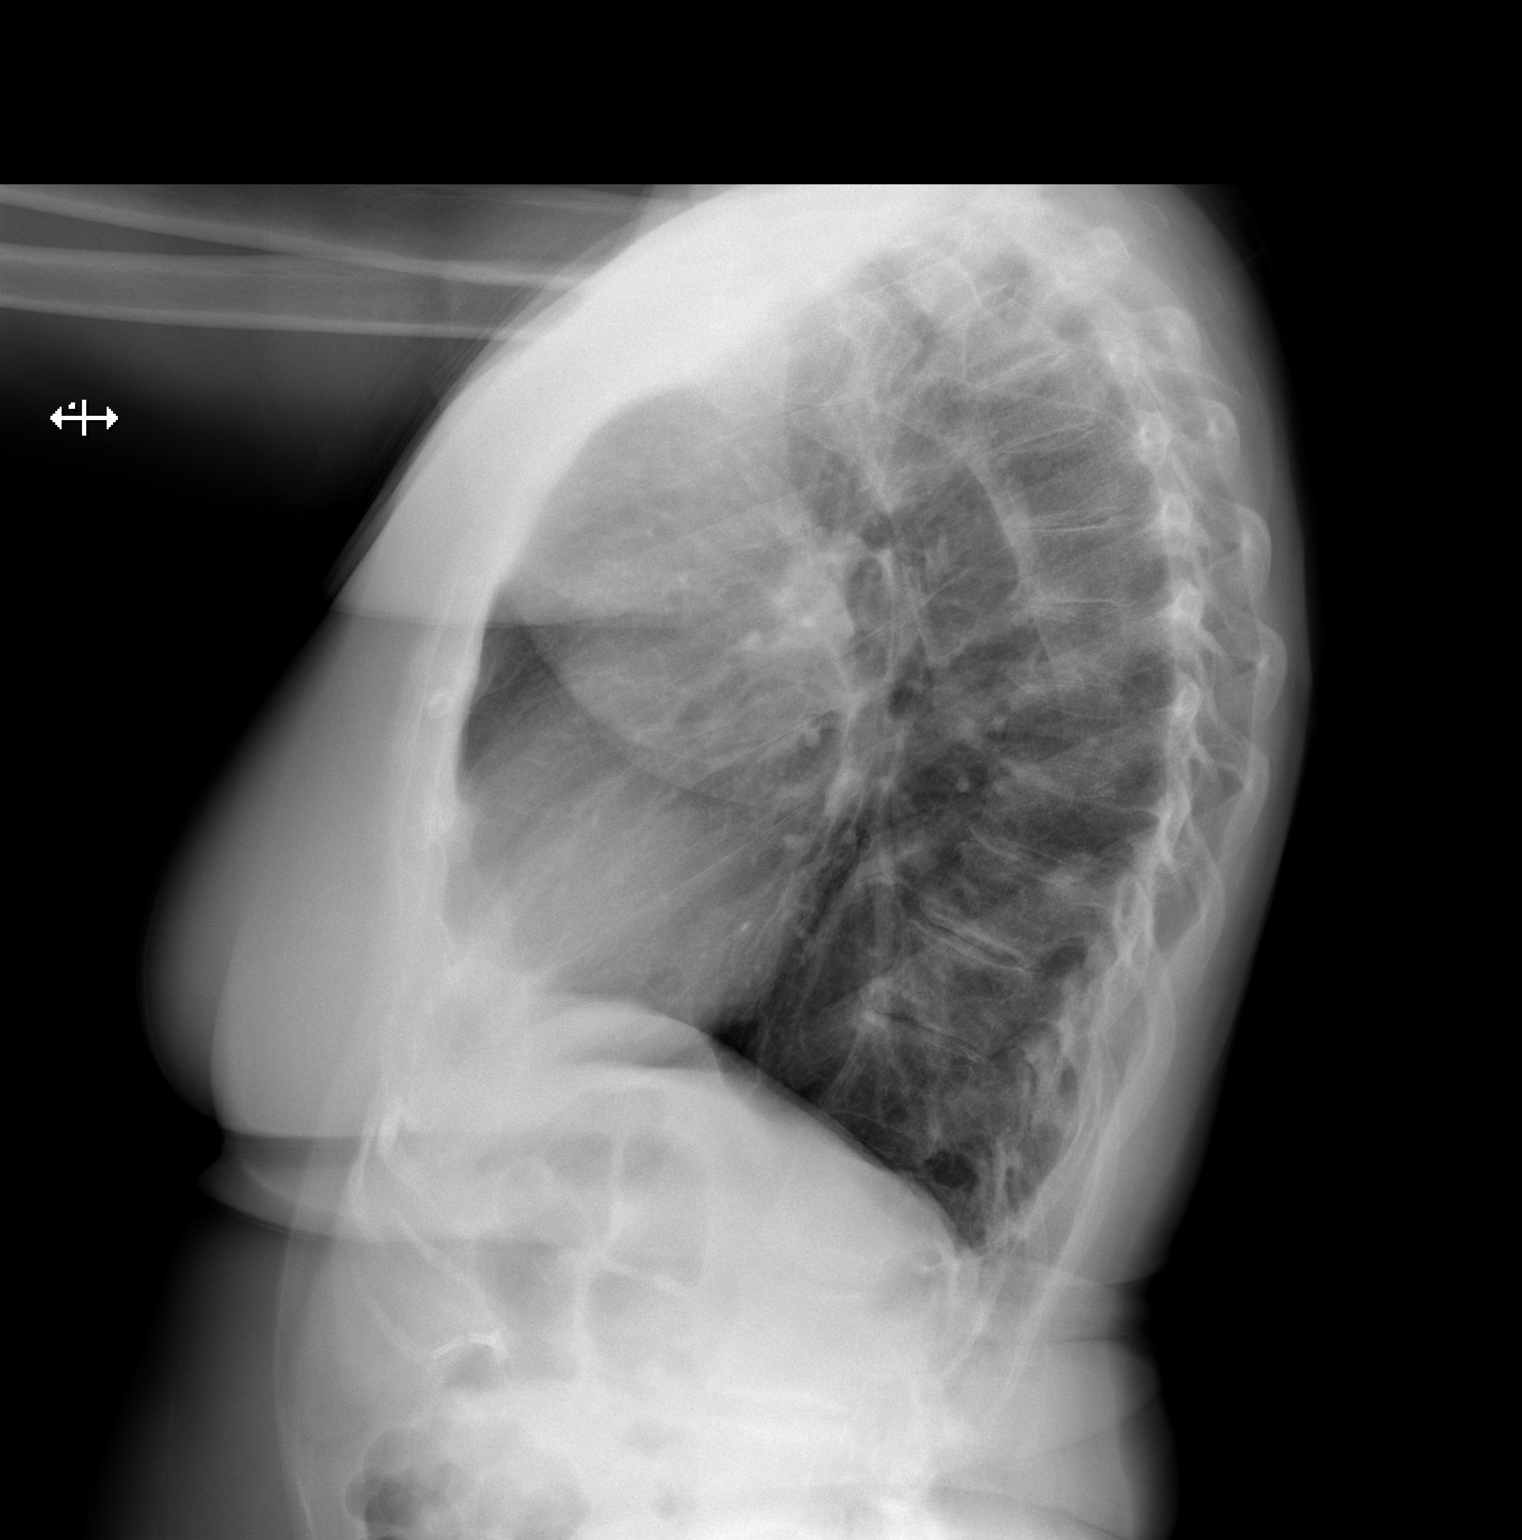

[2 of 2 positions shown; findings below may reference images not displayed]

FINDINGS: The heart size and mediastinal contours are within normal limits.
Both lungs are clear. Mild scoliosis.
IMPRESSION: No active cardiopulmonary disease.

## 2022-11-19 ENCOUNTER — Other Ambulatory Visit: Payer: Self-pay | Admitting: Internal Medicine

## 2022-11-20 ENCOUNTER — Encounter: Payer: Self-pay | Admitting: Internal Medicine

## 2022-12-02 ENCOUNTER — Other Ambulatory Visit: Payer: Self-pay | Admitting: Internal Medicine

## 2022-12-10 DIAGNOSIS — E039 Hypothyroidism, unspecified: Secondary | ICD-10-CM | POA: Diagnosis not present

## 2022-12-18 DIAGNOSIS — Z79899 Other long term (current) drug therapy: Secondary | ICD-10-CM | POA: Diagnosis not present

## 2022-12-18 DIAGNOSIS — M1990 Unspecified osteoarthritis, unspecified site: Secondary | ICD-10-CM | POA: Diagnosis not present

## 2022-12-18 DIAGNOSIS — M150 Primary generalized (osteo)arthritis: Secondary | ICD-10-CM | POA: Diagnosis not present

## 2022-12-18 DIAGNOSIS — M1129 Other chondrocalcinosis, multiple sites: Secondary | ICD-10-CM | POA: Diagnosis not present

## 2022-12-18 DIAGNOSIS — D7218 Eosinophilia in diseases classified elsewhere: Secondary | ICD-10-CM | POA: Diagnosis not present

## 2023-01-07 ENCOUNTER — Other Ambulatory Visit: Payer: Self-pay | Admitting: Internal Medicine

## 2023-01-20 DIAGNOSIS — Z23 Encounter for immunization: Secondary | ICD-10-CM | POA: Diagnosis not present

## 2023-02-19 DIAGNOSIS — R609 Edema, unspecified: Secondary | ICD-10-CM | POA: Diagnosis not present

## 2023-02-19 DIAGNOSIS — M150 Primary generalized (osteo)arthritis: Secondary | ICD-10-CM | POA: Diagnosis not present

## 2023-02-19 DIAGNOSIS — I872 Venous insufficiency (chronic) (peripheral): Secondary | ICD-10-CM | POA: Diagnosis not present

## 2023-02-19 DIAGNOSIS — M1129 Other chondrocalcinosis, multiple sites: Secondary | ICD-10-CM | POA: Diagnosis not present

## 2023-02-19 DIAGNOSIS — M1990 Unspecified osteoarthritis, unspecified site: Secondary | ICD-10-CM | POA: Diagnosis not present

## 2023-02-19 DIAGNOSIS — D7218 Eosinophilia in diseases classified elsewhere: Secondary | ICD-10-CM | POA: Diagnosis not present

## 2023-02-19 DIAGNOSIS — L309 Dermatitis, unspecified: Secondary | ICD-10-CM | POA: Diagnosis not present

## 2023-02-19 DIAGNOSIS — Z79899 Other long term (current) drug therapy: Secondary | ICD-10-CM | POA: Diagnosis not present

## 2023-04-09 ENCOUNTER — Ambulatory Visit (INDEPENDENT_AMBULATORY_CARE_PROVIDER_SITE_OTHER): Payer: Medicare Other | Admitting: Internal Medicine

## 2023-04-09 VITALS — BP 130/80 | HR 91 | Ht 63.0 in | Wt 153.0 lb

## 2023-04-09 DIAGNOSIS — R21 Rash and other nonspecific skin eruption: Secondary | ICD-10-CM

## 2023-04-09 LAB — CBC WITH DIFFERENTIAL/PLATELET
Absolute Lymphocytes: 1346 {cells}/uL (ref 850–3900)
Absolute Monocytes: 1056 {cells}/uL — ABNORMAL HIGH (ref 200–950)
Basophils Absolute: 51 {cells}/uL (ref 0–200)
Basophils Relative: 1 %
Eosinophils Absolute: 418 {cells}/uL (ref 15–500)
Eosinophils Relative: 8.2 %
HCT: 43.7 % (ref 35.0–45.0)
Hemoglobin: 14.7 g/dL (ref 11.7–15.5)
MCH: 29.6 pg (ref 27.0–33.0)
MCHC: 33.6 g/dL (ref 32.0–36.0)
MCV: 88.1 fL (ref 80.0–100.0)
MPV: 8.4 fL (ref 7.5–12.5)
Monocytes Relative: 20.7 %
Neutro Abs: 2229 {cells}/uL (ref 1500–7800)
Neutrophils Relative %: 43.7 %
Platelets: 314 10*3/uL (ref 140–400)
RBC: 4.96 10*6/uL (ref 3.80–5.10)
RDW: 13.1 % (ref 11.0–15.0)
Total Lymphocyte: 26.4 %
WBC: 5.1 10*3/uL (ref 3.8–10.8)

## 2023-04-09 MED ORDER — METHYLPREDNISOLONE 4 MG PO TABS: ORAL_TABLET | ORAL | 0 refills | Status: DC

## 2023-04-09 MED ORDER — MUPIROCIN 2 % EX OINT: 1.0000 | TOPICAL_OINTMENT | Freq: Two times a day (BID) | CUTANEOUS | 0 refills | Status: DC

## 2023-04-09 NOTE — Progress Notes (Addendum)
Patient Care Team: [name], MD as PCP - General (Internal Medicine) Sherry Red, MD as PCP - Cardiology (Cardiology)  Visit Date: 04/09/23  Subjective:   Chief Complaint  Patient presents with   Rash    [name], started injections on 03/25/23 and stopped 04/05/23. Rash started on 04/05/23.    [name], [name] DOB:08/12/1945,77 y.o. NWG:[id number]   78 y.o. Female presents today for acute sick visit with Rash. Patient has a past medical history of Osteoarthritis and Psoriatic Arthritis. Was started on Kineret 03/25/23, she took 11 days with no reaction and rash onset on the 12th day (1/19) with focal erythema at the injection sites, blistering that has improved, warmness of the injection sites, and malaise. Endorses burning sensation on palpation to rash. No wheezing, palpations, or swelling of face/lips. She has since stopped injections.    Allergies  Allergen Reactions   Betadine [Povidone Iodine] Rash and Other (See Comments)    blisters   Penicillins Anaphylaxis   Leflunomide Diarrhea   Levofloxacin Other (See Comments)    Muscle weakness, couldn't move well   Doxycycline Rash    Rash on arms and palms    Golimumab Rash   Past Medical History:  Diagnosis Date   Allergic bronchitis    Allergic bronchitis    Allergy    Arthritis    Claustrophobia    Dependent edema    GERD (gastroesophageal reflux disease)    occas problem - burning, cough would take prilosec if needed   H/O hiatal hernia    HSV-2 (herpes simplex virus 2) infection    Hypothyroidism    Peripheral neuropathy    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 Negative for fever.  HENT:  Negative for congestion.   Eyes:  Negative for blurred vision.  Respiratory:  Negative for cough and shortness of breath.   Cardiovascular:  Negative for chest pain, palpitations and leg swelling.  Gastrointestinal:  Negative for vomiting.  Musculoskeletal:  Negative for back pain.  Skin:  Positive for rash (with minor blistering that has improved and warmth from the rash with a burning sensation on palpation).  Neurological:  Negative for loss of consciousness and headaches.     Objective:  Vitals: Ht 5\' 3"  (1.6 m)   Wt 153 lb (69.4 kg)   BMI 27.10 kg/m   Physical Exam Vitals and nursing note reviewed.  Constitutional:      General: She is not in acute distress.    Appearance: Normal appearance. She is not toxic-appearing.  HENT:     Head: Normocephalic and atraumatic.  Cardiovascular:     Rate and Rhythm: Normal rate and regular rhythm. No extrasystoles are present.    Pulses: Normal pulses.     Heart sounds: Normal heart sounds. No murmur heard.    No friction rub. No gallop.  Pulmonary:     Effort: Pulmonary effort is normal. No respiratory distress.     Breath sounds: Normal breath sounds. No wheezing  or rales.  Skin:    General: Skin is warm and dry.     Findings: Erythema (upper thighs bilaterally, focal to injection sites) present.     Comments: Upper Thighs bilaterally: Erythematous papular lesions, some of which seem to have a yellowish exudate.   Neurological:     Mental Status: She is alert and oriented to person, place, and time. Mental status is at baseline.  Psychiatric:        Mood and Affect: Mood normal.        Behavior: Behavior normal.        Thought Content: Thought content normal.        Judgment: Judgment normal.     Results:  Studies Obtained And Personally Reviewed By Me: Labs:     Component Value Date/Time   NA 141  09/02/2022 0955   K 4.4 09/02/2022 0955   CL 100 09/02/2022 0955   CO2 35 (H) 09/02/2022 0955   GLUCOSE 90 09/02/2022 0955   BUN 16 09/02/2022 0955   CREATININE 0.71 09/02/2022 0955   CALCIUM 9.6 09/02/2022 0955   PROT 7.2 09/02/2022 0955   ALBUMIN 4.4 01/27/2019 1039   AST 22 09/02/2022 0955   AST 18 01/27/2019 1039   ALT 20 09/02/2022 0955   ALT 13 01/27/2019 1039   ALKPHOS 82 01/27/2019 1039   BILITOT 0.8 09/02/2022 0955   BILITOT 0.7 01/27/2019 1039   GFRNONAA 74 01/17/2020 1144   GFRAA 86 01/17/2020 1144    Lab Results  Component Value Date   WBC 8.5 02/28/2022   HGB 14.3 02/28/2022   HCT 41.2 02/28/2022   MCV 93.4 02/28/2022   PLT 318 02/28/2022   Lab Results  Component Value Date   CHOL 243 (H) 09/02/2022   HDL 87 09/02/2022   LDLCALC 131 (H) 09/02/2022   TRIG 135 09/02/2022   CHOLHDL 2.8 09/02/2022   Lab Results  Component Value Date   TSH 8.72 (H) 11/04/2022   Assessment & Plan:  Rash seem to be an injection site reaction (skin around injection site appears to be thickened, erythematous and warm to touch with some yellowish exudates). Ordered CBC with Differential/Platelet to r/o infection.  Sending in 4 mg Medrol - take in tapering course as directed 6-5-4-3-2-1 and Mupirocin ointment - clean affected areas gently with warm soapy water twice a day and then apply ointment topically. Contact [facility] for further instructions.    Addendum: CBC is WNL. Patient contacted with results. Will fax this note with photos to Dr. Casilda Carls and [name]. NP along with CBC results.  I,[name],acting as a Neurosurgeon for [name], MD.,have documented all relevant documentation on the behalf of [name], MD,as directed by  [name], MD while in the presence of [name], MD.   I, [name], MD, have reviewed all documentation for this visit. The documentation on 04/09/23 for the exam, diagnosis, procedures, and orders are all accurate and  complete.

## 2023-04-09 NOTE — Patient Instructions (Addendum)
CBC with diff drawn. Results are normal. Patient contacted. Will fax note to [facility] staff. Patient not willing to continue this medication. She will take tapering course of Medrol.

## 2023-04-10 NOTE — Progress Notes (Signed)
Notes faxed.

## 2023-04-13 ENCOUNTER — Encounter: Payer: Self-pay | Admitting: Internal Medicine

## 2023-04-15 ENCOUNTER — Telehealth: Payer: Medicare Other | Admitting: Internal Medicine

## 2023-04-15 ENCOUNTER — Encounter: Payer: Self-pay | Admitting: Internal Medicine

## 2023-04-15 ENCOUNTER — Ambulatory Visit (INDEPENDENT_AMBULATORY_CARE_PROVIDER_SITE_OTHER): Payer: Medicare Other

## 2023-04-15 VITALS — HR 56 | Temp 99.0°F | Ht 63.0 in | Wt 153.0 lb

## 2023-04-15 DIAGNOSIS — J101 Influenza due to other identified influenza virus with other respiratory manifestations: Secondary | ICD-10-CM

## 2023-04-15 DIAGNOSIS — Z0000 Encounter for general adult medical examination without abnormal findings: Secondary | ICD-10-CM | POA: Diagnosis not present

## 2023-04-15 DIAGNOSIS — E039 Hypothyroidism, unspecified: Secondary | ICD-10-CM

## 2023-04-15 DIAGNOSIS — M1990 Unspecified osteoarthritis, unspecified site: Secondary | ICD-10-CM | POA: Diagnosis not present

## 2023-04-15 MED ORDER — BENZONATATE 100 MG PO CAPS: 100.0000 mg | ORAL_CAPSULE | Freq: Three times a day (TID) | ORAL | 1 refills | Status: DC | PRN

## 2023-04-15 MED ORDER — OSELTAMIVIR PHOSPHATE 75 MG PO CAPS: ORAL_CAPSULE | ORAL | 0 refills | Status: DC

## 2023-04-15 NOTE — Progress Notes (Signed)
78 year old Female with history of inflammatory arthritis treated with DMARD medication ( Kineret) per Dr. Judith Blonder at [facility] as well as history of hypothyroidism presents today with positive home Influenza test.  Patient has headache, chills, temperature 99 degrees, feels very fatigued/exhausted and has a cough.  She did take a flu vaccine earlier this year.  She has a history of restless leg syndrome and dependent edema.  She is not vomiting.  She is able to take in fluids and stay well-hydrated.  She is seen today via interactive audio and video telecommunications.  She is at her home and I am at my office.  She is agreeable to visit in this format today.  Patient is a former Engineer, civil (consulting).  She gives a reliable  history and was able to do a flu test without any difficulty with positive result.  Symptoms are consistent with influenza A  Plan: Prescribing Tamiflu 75 mg twice daily for 5 days.  She is to quarantine for 2 to 3 days until afebrile and feeling better.  She will call if she has worsening symptoms.  She is requesting Tessalon Perles for cough and sending in prescription for 100 mg to take 1 up to 3 times daily as needed for cough.  Rest and stay well-hydrated.  Time spent with this encounter including chart review, interviewing patient and medical decision making is 15 minutes

## 2023-04-15 NOTE — Patient Instructions (Signed)
Next appointment: Follow up in one year for your annual wellness visit    Preventive Care 65 Years and Older, Female Preventive care refers to lifestyle choices and visits with your health care provider that can promote health and wellness. What does preventive care include? A yearly physical exam. This is also called an annual well check. Dental exams once or twice a year. Routine eye exams. Ask your health care provider how often you should have your eyes checked. Personal lifestyle choices, including: Daily care of your teeth and gums. Regular physical activity. Eating a healthy diet. Avoiding tobacco and drug use. Limiting alcohol use. Practicing safe sex. Taking low-dose aspirin every day. Taking vitamin and mineral supplements as recommended by your health care provider. What happens during an annual well check? The services and screenings done by your health care provider during your annual well check will depend on your age, overall health, lifestyle risk factors, and family history of disease. Counseling  Your health care provider may ask you questions about your: Alcohol use. Tobacco use. Drug use. Emotional well-being. Home and relationship well-being. Sexual activity. Eating habits. History of falls. Memory and ability to understand (cognition). Work and work Astronomer. Reproductive health. Screening  You may have the following tests or measurements: Height, weight, and BMI. Blood pressure. Lipid and cholesterol levels. These may be checked every 5 years, or more frequently if you are over 47 years old. Skin check. Lung cancer screening. You may have this screening every year starting at age 23 if you have a 30-pack-year history of smoking and currently smoke or have quit within the past 15 years. Fecal occult blood test (FOBT) of the stool. You may have this test every year starting at age 51. Flexible sigmoidoscopy or colonoscopy. You may have a  sigmoidoscopy every 5 years or a colonoscopy every 10 years starting at age 26. Hepatitis C blood test. Hepatitis B blood test. Sexually transmitted disease (STD) testing. Diabetes screening. This is done by checking your blood sugar (glucose) after you have not eaten for a while (fasting). You may have this done every 1-3 years. Bone density scan. This is done to screen for osteoporosis. You may have this done starting at age 71. Mammogram. This may be done every 1-2 years. Talk to your health care provider about how often you should have regular mammograms. Talk with your health care provider about your test results, treatment options, and if necessary, the need for more tests. Vaccines  Your health care provider may recommend certain vaccines, such as: Influenza vaccine. This is recommended every year. Tetanus, diphtheria, and acellular pertussis (Tdap, Td) vaccine. You may need a Td booster every 10 years. Zoster vaccine. You may need this after age 27. Pneumococcal 13-valent conjugate (PCV13) vaccine. One dose is recommended after age 67. Pneumococcal polysaccharide (PPSV23) vaccine. One dose is recommended after age 36. Talk to your health care provider about which screenings and vaccines you need and how often you need them. This information is not intended to replace advice given to you by your health care provider. Make sure you discuss any questions you have with your health care provider. Document Released: 03/30/2015 Document Revised: 11/21/2015 Document Reviewed: 01/02/2015 Elsevier Interactive Patient Education  2017 [employer].  Fall Prevention in the Home Falls can cause injuries. They can happen to people of all ages. There are many things you can do to make your home safe and to help prevent falls. What can I do on the outside of  my home? Regularly fix the edges of walkways and driveways and fix any cracks. Remove anything that might make you trip as you walk through a  door, such as a raised step or threshold. Trim any bushes or trees on the path to your home. Use bright outdoor lighting. Clear any walking paths of anything that might make someone trip, such as rocks or tools. Regularly check to see if handrails are loose or broken. Make sure that both sides of any steps have handrails. Any raised decks and porches should have guardrails on the edges. Have any leaves, snow, or ice cleared regularly. Use sand or salt on walking paths during winter. Clean up any spills in your garage right away. This includes oil or grease spills. What can I do in the bathroom? Use night lights. Install grab bars by the toilet and in the tub and shower. Do not use towel bars as grab bars. Use non-skid mats or decals in the tub or shower. If you need to sit down in the shower, use a plastic, non-slip stool. Keep the floor dry. Clean up any water that spills on the floor as soon as it happens. Remove soap buildup in the tub or shower regularly. Attach bath mats securely with double-sided non-slip rug tape. Do not have throw rugs and other things on the floor that can make you trip. What can I do in the bedroom? Use night lights. Make sure that you have a light by your bed that is easy to reach. Do not use any sheets or blankets that are too big for your bed. They should not hang down onto the floor. Have a firm chair that has side arms. You can use this for support while you get dressed. Do not have throw rugs and other things on the floor that can make you trip. What can I do in the kitchen? Clean up any spills right away. Avoid walking on wet floors. Keep items that you use a lot in easy-to-reach places. If you need to reach something above you, use a strong step stool that has a grab bar. Keep electrical cords out of the way. Do not use floor polish or wax that makes floors slippery. If you must use wax, use non-skid floor wax. Do not have throw rugs and other things  on the floor that can make you trip. What can I do with my stairs? Do not leave any items on the stairs. Make sure that there are handrails on both sides of the stairs and use them. Fix handrails that are broken or loose. Make sure that handrails are as long as the stairways. Check any carpeting to make sure that it is firmly attached to the stairs. Fix any carpet that is loose or worn. Avoid having throw rugs at the top or bottom of the stairs. If you do have throw rugs, attach them to the floor with carpet tape. Make sure that you have a light switch at the top of the stairs and the bottom of the stairs. If you do not have them, ask someone to add them for you. What else can I do to help prevent falls? Wear shoes that: Do not have high heels. Have rubber bottoms. Are comfortable and fit you well. Are closed at the toe. Do not wear sandals. If you use a stepladder: Make sure that it is fully opened. Do not climb a closed stepladder. Make sure that both sides of the stepladder are locked into place. Ask  someone to hold it for you, if possible. Clearly mark and make sure that you can see: Any grab bars or handrails. First and last steps. Where the edge of each step is. Use tools that help you move around (mobility aids) if they are needed. These include: Canes. Walkers. Scooters. Crutches. Turn on the lights when you go into a dark area. Replace any light bulbs as soon as they burn out. Set up your furniture so you have a clear path. Avoid moving your furniture around. If any of your floors are uneven, fix them. If there are any pets around you, be aware of where they are. Review your medicines with your doctor. Some medicines can make you feel dizzy. This can increase your chance of falling. Ask your doctor what other things that you can do to help prevent falls. This information is not intended to replace advice given to you by your health care provider. Make sure you discuss any  questions you have with your health care provider. Document Released: 12/28/2008 Document Revised: 08/09/2015 Document Reviewed: 04/07/2014 Elsevier Interactive Patient Education  2017 [employer].

## 2023-04-15 NOTE — Progress Notes (Signed)
Subjective:   [name] is a 78 y.o. female who presents for Medicare Annual (Subsequent) preventive examination.  Visit Complete: Virtual I connected with  [name] on 04/15/23 by a audio enabled telemedicine application and verified that I am speaking with the correct person using two identifiers.  Patient Location: Home  Provider Location: Office/Clinic  I discussed the limitations of evaluation and management by telemedicine. The patient expressed understanding and agreed to proceed.  Vital Signs: Because this visit was a virtual/telehealth visit, some criteria may be missing or patient reported. Any vitals not documented were not able to be obtained and vitals that have been documented are patient reported.  Patient Medicare AWV questionnaire was completed by the patient on 04/15/23; I have confirmed that all information answered by patient is correct and no changes since this date.        Objective:    Today's Vitals   04/15/23 0938  Pulse: (!) 56  Temp: 99 F (37.2 C)  SpO2: 99%  Weight: 153 lb (69.4 kg)  Height: 5\' 3"  (1.6 m)   Body mass index is 27.1 kg/m.     04/15/2023    9:40 AM 03/04/2022   11:02 AM 01/31/2021   10:10 AM 01/27/2019   10:49 AM 12/10/2018   10:18 AM 10/04/2015    2:22 PM 07/02/2012    3:13 PM  Advanced Directives  Does Patient Have a Medical Advance Directive? No;Yes No Yes Yes Yes No Patient has advance directive, copy not in chart  Type of Advance Directive Living will  Healthcare Power of [place];Living will Healthcare Power of [place];Living will Living will  Healthcare Power of [place];Living will  Does patient want to make changes to medical advance directive?   No - Patient declined No - Patient declined No - Patient declined    Copy of Healthcare Power of Attorney in Chart?   No - copy requested No - copy requested   Copy requested from family  Would patient like information on creating a medical advance directive?  No -  Patient declined  No - Patient declined     Pre-existing out of facility DNR order (yellow form or pink MOST form)       No    Current Medications (verified) Outpatient Encounter Medications as of 04/15/2023  Medication Sig   ALPRAZolam (XANAX) 0.5 MG tablet TAKE 1 TABLET BY MOUTH EACH NIGHT AT BEDTIME AS NEEDED SLEEP   EPINEPHrine 0.3 mg/0.3 mL IJ SOAJ injection    glucosamine-chondroitin 500-400 MG tablet Take 1 tablet by mouth 2 (two) times daily.   levothyroxine (SYNTHROID) 125 MCG tablet Take 1 tablet (125 mcg total) by mouth daily.   Magnesium 400 MG CAPS Take by mouth.   methylPREDNISolone (MEDROL) 4 MG tablet Take in tapering course as directed 6-5-4-3-2-1   Multiple Vitamin (MULTIVITAMIN) tablet Take 1 tablet by mouth daily.   Multiple Vitamins-Minerals (PRESERVISION AREDS 2 PO) Take by mouth.   mupirocin ointment (BACTROBAN) 2 % Apply 1 Application topically 2 (two) times daily.   OVER THE COUNTER MEDICATION Vitamin D 3  One daily   potassium chloride SA (KLOR-CON M) 20 MEQ tablet Take 1 tablet (20 mEq total) by mouth daily.   rOPINIRole (REQUIP) 0.5 MG tablet TAKE 1 TABLET BY MOUTH 3 TIMES DAILY   torsemide (DEMADEX) 20 MG tablet Take 1 tablet (20 mg total) by mouth daily.   Facility-Administered Encounter Medications as of 04/15/2023  Medication   clotrimazole-betamethasone (LOTRISONE) cream  Allergies (verified) Betadine [povidone iodine], Penicillins, Leflunomide, Levofloxacin, Doxycycline, and Golimumab   History: Past Medical History:  Diagnosis Date   Allergic bronchitis    Allergic bronchitis    Allergy    Arthritis    Claustrophobia    Dependent edema    GERD (gastroesophageal reflux disease)    occas problem - burning, cough would take prilosec if needed   H/O hiatal hernia    HSV-2 (herpes simplex virus 2) infection    Hypothyroidism    Peripheral neuropathy    3 toes right foot 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 problem   Psoriatic arthritis (HCC)  02/18/2019   Thyroid disease    Past Surgical History:  Procedure Laterality Date   BACK SURGERY     BREAST BIOPSY  1972   benign   CARPAL TUNNEL RELEASE Left 12/17/2018   Procedure: LEFT CARPAL TUNNEL RELEASE;  Surgeon: Betha Loa, MD;  Location: [facility];  Service: Orthopedics;  Laterality: Left;   CHOLECYSTECTOMY     LUMBAR LAMINECTOMY  1999   L4   LUMBAR LAMINECTOMY  1981   L5   TOTAL HIP ARTHROPLASTY Right 07/02/2012   Procedure: RIGHT TOTAL HIP ARTHROPLASTY ANTERIOR APPROACH;  Surgeon: Kathryne Hitch, MD;  Location: [facility];  Service: Orthopedics;  Laterality: Right;   Family History  Problem Relation Age of Onset   Heart disease Father    Hypertension Sister    Heart disease Brother    Asthma Daughter    Hypertension Sister    Social History   Socioeconomic History   Marital status: Married    Spouse name: Not on file   Number of children: Not on file   Years of education: Not on file   Highest education level: Associate degree: occupational, Scientist, product/process development, or vocational program  Occupational History   Not on file  Tobacco Use   Smoking status: Never   Smokeless tobacco: Never  Vaping Use   Vaping status: Never Used  Substance and Sexual Activity   Alcohol use: Yes    Comment: socially   Drug use: No   Sexual activity: Not on file  Other Topics Concern   Not on file  Social History Narrative   Not on file   Social Drivers of Health   Financial Resource Strain: Low Risk  (04/09/2023)   Overall Financial Resource Strain (CARDIA)    Difficulty of Paying Living Expenses: Not hard at all  Food Insecurity: No Food Insecurity (04/09/2023)   Hunger Vital Sign    Worried About Running Out of Food in the Last Year: Never true    Ran Out of Food in the Last Year: Never true  Transportation Needs: No Transportation Needs (04/09/2023)   PRAPARE - Administrator, Civil Service (Medical): No    Lack of Transportation (Non-Medical): No   Physical Activity: Sufficiently Active (04/09/2023)   Exercise Vital Sign    Days of Exercise per Week: 3 days    Minutes of Exercise per Session: 70 min  Stress: No Stress Concern Present (04/09/2023)   [employer] of Occupational Health - Occupational Stress Questionnaire    Feeling of Stress : Only a little  Social Connections: Socially Integrated (04/09/2023)   Social Connection and Isolation Panel [NHANES]    Frequency of Communication with Friends and Family: More than three times a week    Frequency of Social Gatherings with Friends and Family: Twice a week    Attends Religious Services: More than 4  times per year    Active Member of Clubs or Organizations: Yes    Attends Banker Meetings: More than 4 times per year    Marital Status: Married    Tobacco Counseling Counseling given: Not Answered   Clinical Intake:  Pre-visit preparation completed: No  Pain : No/denies pain     BMI - recorded: 27.1 Nutritional Status: BMI 25 -29 Overweight Nutritional Risks: None Diabetes: No  How often do you need to have someone help you when you read instructions, pamphlets, or other written materials from your doctor or pharmacy?: 1 - Never  Interpreter Needed?: No  Information entered by :: Dixie Dials, CMA   Activities of Daily Living     No data to display          Patient Care Team: Baxley, Luanna Cole, MD as PCP - General (Internal Medicine) Jodelle Red, MD as PCP - Cardiology (Cardiology)  Indicate any recent Medical Services you may have received from other than [facility] providers in the past year (date may be approximate).     Assessment:   This is a routine wellness examination for [name].  Hearing/Vision screen No results found.   Goals Addressed   None    Depression Screen    04/15/2023    9:40 AM 05/19/2022    3:32 PM 03/04/2022   11:01 AM 12/24/2021    3:48 PM 01/31/2021   10:10 AM 01/20/2020    2:09 PM 04/01/2019     4:57 PM  PHQ 2/9 Scores  PHQ - 2 Score 0 0 0 0 0 0 0  PHQ- 9 Score       1    Fall Risk    04/15/2023    9:39 AM 05/19/2022    3:32 PM 03/04/2022   11:01 AM 12/24/2021    3:47 PM 01/31/2021   10:10 AM  Fall Risk   Falls in the past year? 0 0 0 0 0  Number falls in past yr: 0 0 0 0 0  Injury with Fall? 0 0 0 0 0  Risk for fall due to : No Fall Risks No Fall Risks No Fall Risks No Fall Risks No Fall Risks  Follow up Falls evaluation completed;Education provided;Falls prevention discussed Falls prevention discussed Falls prevention discussed Falls evaluation completed Falls evaluation completed    MEDICARE RISK AT HOME: Medicare Risk at Home Any stairs in or around the home?: No If so, are there any without handrails?: No Home free of loose throw rugs in walkways, pet beds, electrical cords, etc?: Yes Adequate lighting in your home to reduce risk of falls?: Yes Life alert?: No Use of a cane, walker or w/c?: No Grab bars in the bathroom?: Yes Shower chair or bench in shower?: Yes Elevated toilet seat or a handicapped toilet?: No  TIMED UP AND GO:  Was the test performed?  No    Cognitive Function:        04/15/2023    9:44 AM 03/04/2022   11:03 AM 01/31/2021   10:11 AM  6CIT Screen  What Year? 0 points 0 points 0 points  What month? 0 points 0 points 0 points  What time? 0 points 0 points 0 points  Count back from 20 0 points 0 points 0 points  Months in reverse 0 points 0 points 2 points  Repeat phrase 0 points 0 points 10 points  Total Score 0 points 0 points 12 points    Immunizations Immunization History  Administered Date(s) Administered   Fluad Quad(high Dose 65+) 12/16/2021   Influenza Split 12/19/2011, 01/18/2013   Influenza Whole 12/03/2009   Influenza, High Dose Seasonal PF 12/07/2017   Influenza,inj,Quad PF,6+ Mos 12/26/2013, 12/07/2014, 12/15/2016, 01/07/2019   Influenza-Unspecified 12/15/2019, 12/11/2020   PFIZER(Purple Top)SARS-COV-2 Vaccination  06/16/2019, 07/11/2019, 01/13/2020   Pfizer(Comirnaty)Fall Seasonal Vaccine 12 years and older 01/16/2022   Pneumococcal Conjugate-13 10/24/2015   Pneumococcal Polysaccharide-23 06/16/2010   Tdap 03/18/2003, 01/01/2018    TDAP status: Up to date  Flu Vaccine status: Up to date  Pneumococcal vaccine status: Up to date  Covid-19 vaccine status: Information provided on how to obtain vaccines.   Qualifies for Shingles Vaccine? Yes   Zostavax completed No   Shingrix Completed?: No.    Education has been provided regarding the importance of this vaccine. Patient has been advised to call insurance company to determine out of pocket expense if they have not yet received this vaccine. Advised may also receive vaccine at local pharmacy or Health Dept. Verbalized acceptance and understanding.  Screening Tests Health Maintenance  Topic Date Due   Zoster Vaccines- Shingrix (1 of 2) Never done   Medicare Annual Wellness (AWV)  03/05/2023   COVID-19 Vaccine (5 - 2024-25 season) 05/01/2023 (Originally 11/16/2022)   DTaP/Tdap/Td (3 - Td or Tdap) 01/02/2028   Pneumonia Vaccine 29+ Years old  Completed   INFLUENZA VACCINE  Completed   DEXA SCAN  Completed   HPV VACCINES  Aged Out   Colonoscopy  Discontinued   Hepatitis C Screening  Discontinued    Health Maintenance  Health Maintenance Due  Topic Date Due   Zoster Vaccines- Shingrix (1 of 2) Never done   Medicare Annual Wellness (AWV)  03/05/2023    Colorectal cancer screening: No longer required.   Mammogram status: Completed 05/22/22. Repeat every year  Bone Density status: Completed 06/02/22. Results reflect: Bone density results: OSTEOPENIA. Repeat every 2 years.  Lung Cancer Screening: (Low Dose CT Chest recommended if Age 15-80 years, 20 pack-year currently smoking OR have quit w/in 15years.) does not qualify.    Additional Screening:  Hepatitis C Screening: does not qualify; Completed   Dental Screening: Recommended annual  dental exams for proper oral hygiene   Community Resource Referral / Chronic Care Management: CRR required this visit?  No   CCM required this visit?  No     Plan:     I have personally reviewed and noted the following in the patient's chart:   Medical and social history Use of alcohol, tobacco or illicit drugs  Current medications and supplements including opioid prescriptions. Patient is not currently taking opioid prescriptions. Functional ability and status Nutritional status Physical activity Advanced directives List of other physicians Hospitalizations, surgeries, and ER visits in previous 12 months Vitals Screenings to include cognitive, depression, and falls Referrals and appointments  In addition, I have reviewed and discussed with patient certain preventive protocols, quality metrics, and best practice recommendations. A written personalized care plan for preventive services as well as general preventive health recommendations were provided to patient.     Shaman Muscarella Sharlyne Cai, CMA   04/15/2023   After Visit Summary: (Mail) Due to this being a telephonic visit, the after visit summary with patients personalized plan was offered to patient via mail

## 2023-04-15 NOTE — Patient Instructions (Signed)
We are sorry you are not feeling well.  Please take Tamiflu 75 mg twice daily for 5 days.  Quarantine for 2 to 3 days or until afebrile and feeling better.  She will call if she has worsening symptoms.  Tessalon Perles sent and to take up to 3 times daily as needed for cough.  Rest and stay well-hydrated.

## 2023-04-16 ENCOUNTER — Encounter: Payer: Self-pay | Admitting: Internal Medicine

## 2023-04-23 DIAGNOSIS — Z1330 Encounter for screening examination for mental health and behavioral disorders, unspecified: Secondary | ICD-10-CM | POA: Diagnosis not present

## 2023-04-23 DIAGNOSIS — E890 Postprocedural hypothyroidism: Secondary | ICD-10-CM | POA: Diagnosis not present

## 2023-05-05 ENCOUNTER — Other Ambulatory Visit: Payer: Medicare Other

## 2023-05-05 DIAGNOSIS — L233 Allergic contact dermatitis due to drugs in contact with skin: Secondary | ICD-10-CM | POA: Diagnosis not present

## 2023-05-05 DIAGNOSIS — D225 Melanocytic nevi of trunk: Secondary | ICD-10-CM | POA: Diagnosis not present

## 2023-05-05 DIAGNOSIS — E7800 Pure hypercholesterolemia, unspecified: Secondary | ICD-10-CM

## 2023-05-05 DIAGNOSIS — D1801 Hemangioma of skin and subcutaneous tissue: Secondary | ICD-10-CM | POA: Diagnosis not present

## 2023-05-05 DIAGNOSIS — L814 Other melanin hyperpigmentation: Secondary | ICD-10-CM | POA: Diagnosis not present

## 2023-05-05 DIAGNOSIS — E039 Hypothyroidism, unspecified: Secondary | ICD-10-CM | POA: Diagnosis not present

## 2023-05-05 DIAGNOSIS — R21 Rash and other nonspecific skin eruption: Secondary | ICD-10-CM | POA: Diagnosis not present

## 2023-05-05 DIAGNOSIS — E2839 Other primary ovarian failure: Secondary | ICD-10-CM | POA: Diagnosis not present

## 2023-05-05 DIAGNOSIS — D2271 Melanocytic nevi of right lower limb, including hip: Secondary | ICD-10-CM | POA: Diagnosis not present

## 2023-05-05 DIAGNOSIS — R208 Other disturbances of skin sensation: Secondary | ICD-10-CM | POA: Diagnosis not present

## 2023-05-05 DIAGNOSIS — L700 Acne vulgaris: Secondary | ICD-10-CM | POA: Diagnosis not present

## 2023-05-05 DIAGNOSIS — Z0000 Encounter for general adult medical examination without abnormal findings: Secondary | ICD-10-CM | POA: Diagnosis not present

## 2023-05-05 DIAGNOSIS — R7989 Other specified abnormal findings of blood chemistry: Secondary | ICD-10-CM

## 2023-05-05 DIAGNOSIS — D2261 Melanocytic nevi of right upper limb, including shoulder: Secondary | ICD-10-CM | POA: Diagnosis not present

## 2023-05-05 DIAGNOSIS — D2371 Other benign neoplasm of skin of right lower limb, including hip: Secondary | ICD-10-CM | POA: Diagnosis not present

## 2023-05-06 LAB — COMPLETE METABOLIC PANEL WITH GFR
AG Ratio: 1.6 (calc) (ref 1.0–2.5)
ALT: 13 U/L (ref 6–29)
AST: 15 U/L (ref 10–35)
Albumin: 4 g/dL (ref 3.6–5.1)
Alkaline phosphatase (APISO): 89 U/L (ref 37–153)
BUN: 15 mg/dL (ref 7–25)
CO2: 28 mmol/L (ref 20–32)
Calcium: 9.5 mg/dL (ref 8.6–10.4)
Chloride: 96 mmol/L — ABNORMAL LOW (ref 98–110)
Creat: 0.67 mg/dL (ref 0.60–1.00)
Globulin: 2.5 g/dL (ref 1.9–3.7)
Glucose, Bld: 81 mg/dL (ref 65–99)
Potassium: 4.2 mmol/L (ref 3.5–5.3)
Sodium: 135 mmol/L (ref 135–146)
Total Bilirubin: 0.8 mg/dL (ref 0.2–1.2)
Total Protein: 6.5 g/dL (ref 6.1–8.1)
eGFR: 89 mL/min/{1.73_m2} (ref >=60)

## 2023-05-06 LAB — CBC WITH DIFFERENTIAL/PLATELET
Absolute Lymphocytes: 1156 {cells}/uL (ref 850–3900)
Absolute Monocytes: 775 {cells}/uL (ref 200–950)
Basophils Absolute: 48 {cells}/uL (ref 0–200)
Basophils Relative: 0.7 %
Eosinophils Absolute: 401 {cells}/uL (ref 15–500)
Eosinophils Relative: 5.9 %
HCT: 41.7 % (ref 35.0–45.0)
Hemoglobin: 14 g/dL (ref 11.7–15.5)
MCH: 30.3 pg (ref 27.0–33.0)
MCHC: 33.6 g/dL (ref 32.0–36.0)
MCV: 90.3 fL (ref 80.0–100.0)
MPV: 8.2 fL (ref 7.5–12.5)
Monocytes Relative: 11.4 %
Neutro Abs: 4420 {cells}/uL (ref 1500–7800)
Neutrophils Relative %: 65 %
Platelets: 343 10*3/uL (ref 140–400)
RBC: 4.62 10*6/uL (ref 3.80–5.10)
RDW: 13.2 % (ref 11.0–15.0)
Total Lymphocyte: 17 %
WBC: 6.8 10*3/uL (ref 3.8–10.8)

## 2023-05-06 LAB — LIPID PANEL
Cholesterol: 177 mg/dL (ref <200)
HDL: 69 mg/dL (ref >=50)
LDL Cholesterol (Calc): 87 mg/dL
Non-HDL Cholesterol (Calc): 108 mg/dL (ref <130)
Total CHOL/HDL Ratio: 2.6 (calc) (ref <5.0)
Triglycerides: 110 mg/dL (ref <150)

## 2023-05-06 LAB — TSH: TSH: 0.23 m[IU]/L — ABNORMAL LOW (ref 0.40–4.50)

## 2023-05-07 ENCOUNTER — Telehealth (INDEPENDENT_AMBULATORY_CARE_PROVIDER_SITE_OTHER): Payer: Medicare Other | Admitting: Internal Medicine

## 2023-05-07 ENCOUNTER — Ambulatory Visit: Payer: Medicare Other | Admitting: Internal Medicine

## 2023-05-07 DIAGNOSIS — M1990 Unspecified osteoarthritis, unspecified site: Secondary | ICD-10-CM

## 2023-05-07 DIAGNOSIS — M2550 Pain in unspecified joint: Secondary | ICD-10-CM

## 2023-05-07 MED ORDER — INDOMETHACIN 25 MG PO CAPS
25.0000 mg | ORAL_CAPSULE | Freq: Three times a day (TID) | ORAL | 0 refills | Status: DC
Start: 2023-05-07 — End: 2023-12-17

## 2023-05-07 NOTE — Progress Notes (Signed)
 Patient Care Team: [name], MD as PCP - General (Internal Medicine) Jodelle Red, MD as PCP - Cardiology (Cardiology)  I connected with Vanessa Nesquehoning on 05/07/23 at 1:25 PM by video enabled telemedicine visit and verified that I am speaking with the correct person using two identifiers, myself and . I am in my office and patient is in their home.    I discussed the limitations, risks, security and privacy concerns of performing an evaluation and management service by telemedicine and the availability of in-person appointments. I also discussed with the patient that there may be a patient responsible charge related to this service. The patient expressed understanding and agreed to proceed.   Other persons participating in the visit and their role in the encounter: Medical scribe, Larey Brick  Patient's location: Home  Provider's location: Clinic   I provided 20 minutes of time spent during this encounter, including chart review, interviewing patient, medical decision making, and e-scribing medication and > 50% was spent counseling as documented under my assessment & plan.   Chief Complaint  Patient presents with   Arthritis   Subjective:  Patient GN:FAOZ Dashawna, [place] DOB:[birth year],77 y.o. HYQ:[id number]   78 y.o. Female presents today for acute visit with Rheumatoid Arthritis. She reports that since halting K[place], she has started having more arthralgia in her hands, worse mostly morning and evening, slightly alleviated with compression gloves. States that she has tried contacting Rheumatology, and she does not have a follow-up appointment on 06/04/2023. Notes that she researched Indomethacin and wanted to try this medication.  Past Medical History:  Diagnosis Date   Allergic bronchitis    Allergic bronchitis    Allergy    Arthritis    Claustrophobia    Dependent edema    GERD (gastroesophageal reflux disease)    occas problem - burning, cough would take prilosec  if needed   H/O hiatal hernia    HSV-2 (herpes simplex virus 2) infection    Hypothyroidism    Peripheral neuropathy    3 toes right foot 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 problem   Psoriatic arthritis (HCC) 02/18/2019   Thyroid disease     Family History  Problem Relation Age of Onset   Heart disease Father    Hypertension Sister    Heart disease Brother    Asthma Daughter    Hypertension Sister    Social hx: Former Engineer, civil (consulting). Retired. Married.  Review of Systems  Musculoskeletal:  Positive for joint pain (hands).     Objective:  Vitals: There were no vitals taken for this visit. Physical Exam Vitals and nursing note reviewed.  Constitutional:      General: She is not in acute distress.    Appearance: Normal appearance. She is not toxic-appearing.  HENT:     Head: Normocephalic and atraumatic.  Pulmonary:     Effort: Pulmonary effort is normal.  Skin:    General: Skin is warm and dry.  Neurological:     Mental Status: She is alert and oriented to person, place, and time. Mental status is at baseline.  Psychiatric:        Mood and Affect: Mood normal.        Behavior: Behavior normal.        Thought Content: Thought content normal.        Judgment: Judgment normal.     Results:  Studies Obtained And Personally Reviewed By Me: Labs:     Component Value Date/Time   NA 135  05/05/2023 0947   K 4.2 05/05/2023 0947   CL 96 (L) 05/05/2023 0947   CO2 28 05/05/2023 0947   GLUCOSE 81 05/05/2023 0947   BUN 15 05/05/2023 0947   CREATININE 0.67 05/05/2023 0947   CALCIUM 9.5 05/05/2023 0947   PROT 6.5 05/05/2023 0947   ALBUMIN 4.4 01/27/2019 1039   AST 15 05/05/2023 0947   AST 18 01/27/2019 1039   ALT 13 05/05/2023 0947   ALT 13 01/27/2019 1039   ALKPHOS 82 01/27/2019 1039   BILITOT 0.8 05/05/2023 0947   BILITOT 0.7 01/27/2019 1039   GFRNONAA 74 01/17/2020 1144   GFRAA 86 01/17/2020 1144    Lab Results  Component Value Date   WBC 6.8 05/05/2023   HGB 14.0 05/05/2023    HCT 41.7 05/05/2023   MCV 90.3 05/05/2023   PLT 343 05/05/2023   Lab Results  Component Value Date   CHOL 177 05/05/2023   HDL 69 05/05/2023   LDLCALC 87 05/05/2023   TRIG 110 05/05/2023   CHOLHDL 2.6 05/05/2023   Lab Results  Component Value Date   TSH 0.23 (L) 05/05/2023    Assessment & Plan:   Rheumatoid Arthritis: sending in 25 mg Indocin - take 1 capsule (25 mg total) by mouth 3 (three) times daily with meals  This prescription is at pt. request. A copy of this note will be sent to her Rheumatologist   I,[name],acting as a scribe for [name], MD.,have documented all relevant documentation on the behalf of [name], MD,as directed by  [name], MD while in the presence of [name], MD.   I, [name], MD, have reviewed all documentation for this visit. The documentation on 05/08/23 for the exam, diagnosis, procedures, and orders are all accurate and complete.

## 2023-05-08 ENCOUNTER — Encounter: Payer: Self-pay | Admitting: Internal Medicine

## 2023-05-08 DIAGNOSIS — H524 Presbyopia: Secondary | ICD-10-CM | POA: Diagnosis not present

## 2023-05-08 DIAGNOSIS — H35362 Drusen (degenerative) of macula, left eye: Secondary | ICD-10-CM | POA: Diagnosis not present

## 2023-05-08 DIAGNOSIS — H2513 Age-related nuclear cataract, bilateral: Secondary | ICD-10-CM | POA: Diagnosis not present

## 2023-05-08 DIAGNOSIS — H52203 Unspecified astigmatism, bilateral: Secondary | ICD-10-CM | POA: Diagnosis not present

## 2023-05-08 DIAGNOSIS — H43813 Vitreous degeneration, bilateral: Secondary | ICD-10-CM | POA: Diagnosis not present

## 2023-05-08 DIAGNOSIS — H25043 Posterior subcapsular polar age-related cataract, bilateral: Secondary | ICD-10-CM | POA: Diagnosis not present

## 2023-05-08 DIAGNOSIS — Z83518 Family history of other specified eye disorder: Secondary | ICD-10-CM | POA: Diagnosis not present

## 2023-05-08 DIAGNOSIS — H5203 Hypermetropia, bilateral: Secondary | ICD-10-CM | POA: Diagnosis not present

## 2023-05-08 DIAGNOSIS — H35363 Drusen (degenerative) of macula, bilateral: Secondary | ICD-10-CM | POA: Diagnosis not present

## 2023-05-08 DIAGNOSIS — D3132 Benign neoplasm of left choroid: Secondary | ICD-10-CM | POA: Diagnosis not present

## 2023-05-08 NOTE — Patient Instructions (Addendum)
 Sending in Indocin 25 mg to take 1 capsules up to 3 times daily with food as needed for joint pain. Patient prefers lower dose.

## 2023-05-12 ENCOUNTER — Ambulatory Visit (INDEPENDENT_AMBULATORY_CARE_PROVIDER_SITE_OTHER): Payer: Medicare Other | Admitting: Internal Medicine

## 2023-05-12 ENCOUNTER — Encounter: Payer: Self-pay | Admitting: Internal Medicine

## 2023-05-12 VITALS — BP 130/80 | HR 82 | Ht 63.0 in | Wt 152.0 lb

## 2023-05-12 DIAGNOSIS — Z87898 Personal history of other specified conditions: Secondary | ICD-10-CM | POA: Diagnosis not present

## 2023-05-12 DIAGNOSIS — F419 Anxiety disorder, unspecified: Secondary | ICD-10-CM | POA: Diagnosis not present

## 2023-05-12 DIAGNOSIS — E039 Hypothyroidism, unspecified: Secondary | ICD-10-CM

## 2023-05-12 DIAGNOSIS — R609 Edema, unspecified: Secondary | ICD-10-CM | POA: Diagnosis not present

## 2023-05-12 DIAGNOSIS — F32A Depression, unspecified: Secondary | ICD-10-CM | POA: Diagnosis not present

## 2023-05-12 DIAGNOSIS — M6588 Other synovitis and tenosynovitis, other site: Secondary | ICD-10-CM

## 2023-05-12 DIAGNOSIS — G2581 Restless legs syndrome: Secondary | ICD-10-CM | POA: Diagnosis not present

## 2023-05-12 MED ORDER — LEVOTHYROXINE SODIUM 112 MCG PO TABS: 112.0000 ug | ORAL_TABLET | Freq: Every day | ORAL | 3 refills | Status: DC

## 2023-05-12 NOTE — Progress Notes (Shared)
 Office Visit   Patient Care Team: Tymarion Everard, Luanna Cole, MD as PCP - General (Internal Medicine) Jodelle Red, MD as PCP - Cardiology (Cardiology)  Visit Date: 05/12/23   Chief Complaint  Patient presents with  . Hypothyroidism  . Rheumatoid Arthritis   Subjective:  Patient: [name], Female DOB: 01-11-1946, 78 y.o. MRN: [id number]  [name] is a 78 y.o. Female who presents today for her Medical Management of Chronic Issues. Patient has history of Hypothyroidism; Anxiety; Allergic Rhinitis; Osteoarthritis of Right Hip; History of Total Hip Replacement, unspecified laterality; Bilateral Sensorineural Hearing Loss; Bilateral Tinnitus; Dependent Edema; Chronic Venous Insufficiency; and Psoriatic Arthritis.  History of Hypothyroidism currently treated with 125 mcg Levothyroxine daily.  Her 05/06/2023 TSH, compared to 11/14/2022: was 0.23, decreased significantly from 8.72. We will defer management ot thyroid replacement to [facility] Endocrinologist, Dr. Lowella Grip.  History of Anxiety/Depression treated with 0.5 mg Xanax nightly as needed.   History of Dependant Edema treated with 20 mg Demadex daily. Previously has seen Dr. Cristal Deer, Cardiologist. Compression stockings were recommended by Vascular Surgeon for Chronic Venous Insufficiency - stockings did not help. Was not thought to have Peripheral Vascular Disease. She was found to have an elevated eosinophil count and noticed her edema always got better with steroids.  History of Rheumatoid Arthritis; Psoriatic Arthritis just recently started on 25 mg Indomethacin TID w/ meals - endorses improvement in pain. Was on Kineret before that, but was seen 03/2023 for injection site inflammatory cellulitis. Previously has also been on Remicade per Dr. Nickola Major, who initially diagnosed her Psoriatic Arthritis. In the past she has explained that a lot of her symptoms started when she was stung by fire ants in the summer of 2022. 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 Today, reports that her ROM in her shoulders is reduced, attributed to her arthritis, causing difficulty raising arms above her head which affects her ability getting dressed in the morning.    History of Restless Leg Syndrome treated with 0.5 mg Requip TID.   Labs 05/06/2023 CBC: WNL CMP, compared to 09/02/2022: Chloride 96, decreased from 100; otherwise normal.  Lipid Panel: WNL  GYN appointment scheduled for 06/24/2023.   Mammogram 05/22/2022 normal with repeat recommendation of 2025.  Colonoscopy 01/27/2006 found mild Diverticulosis in the sigmoid colon; otherwise normal.  Bone Density 06/02/2022 T-score Left Femoral Neck -1.3, osteopenic.  Vaccine Counseling: Due for Covid-19 and Shingles 1/2; UTD on PNA and Tdap. Past Medical History:  Diagnosis Date  . Allergic bronchitis   . Allergic bronchitis   . Allergy   . Arthritis   . Claustrophobia   . Dependent edema   . GERD (gastroesophageal reflux disease)    occas problem - burning, cough would take prilosec if needed  . H/O hiatal hernia   . HSV-2 (herpes simplex virus 2) infection   . Hypothyroidism   . Peripheral neuropathy    3 toes right foot 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 problem  . Psoriatic arthritis (HCC) 02/18/2019  . Thyroid disease    Medical/Surgical History Narrative:  2023 - hx of Seafood Allergy and Vasomotor Rhinitis seen by Dr. Gary Fleet, Allergist. Had food challenge in March 2023. History of Chronic Cough and Vasomotor Rhinitis.   2014 - Right Hip Arthroplasty by Dr. Magnus Ivan in 2014.   2004 - Cholecystectomy   1999 - Lumbar Disc Surgery, L4  1991 - Lumbar Disc Surgery, L5   1972 - Benign Left Breast Biopsy  Other - Allergic to Penicillin -- Anaphylaxis  Family History  Problem Relation Age of Onset  . Heart disease Father   .  Hypertension Sister   . Heart disease Brother   . Asthma Daughter   . Hypertension Sister   Family History Narrative: Mother deceased with hx of Neurological Problems and Heart Disease.  1 brother died at age 75 of Congestive Heart Failure Secondary to Rheumatic Fever Complications.  1 sister with Hypertension and Obesity.  Another sister with Hypertension and Obesity.  1 brother living.  Social History   Social History Narrative   Married - husband is a retired Best boy at a Pharmacist, hospital [employer]. 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 Non-smoker. Social alcohol consumption. Lost a stepson to suicide in 2018. 2 adult daughters from her previous marriage.    Review of Systems  Constitutional:  Negative for chills, fever, malaise/fatigue and weight loss.  HENT:  Negative for hearing loss, sinus pain and sore throat.   Respiratory:  Negative for cough, hemoptysis and shortness of breath.   Cardiovascular:  Negative for chest pain, palpitations, leg swelling and PND.  Gastrointestinal:  Negative for abdominal pain, constipation, diarrhea, heartburn, nausea and vomiting.  Genitourinary:  Negative for dysuria, frequency and urgency.  Musculoskeletal:  Positive for joint pain. Negative for back pain, myalgias and neck pain.       (+) Reduced ROM, shoulders bilaterally causing trouble getting dressed in morning  Skin:  Negative for itching and rash.  Neurological:  Negative for dizziness, tingling, seizures and headaches.  Endo/Heme/Allergies:  Negative for polydipsia.  Psychiatric/Behavioral:  Negative for depression. The patient is not nervous/anxious.     Objective:  Vitals: BP 130/80   Pulse 82   Ht 5\' 3"  (1.6 m)   Wt 152 lb (68.9 kg)   SpO2 98%   BMI 26.93 kg/m  Physical Exam Vitals and nursing note reviewed.  Constitutional:      General: She is not in acute distress.    Appearance: Normal appearance. She is not ill-appearing or  toxic-appearing.  HENT:     Head: Normocephalic and atraumatic.     Right Ear: Hearing, tympanic membrane, ear canal and external ear normal.     Left Ear: Hearing, tympanic membrane, ear canal and external ear normal.     Mouth/Throat:     Pharynx: Oropharynx is clear.  Eyes:     Extraocular Movements: Extraocular movements intact.     Pupils: Pupils are equal, round, and reactive to light.  Neck:     Thyroid: No thyroid mass, thyromegaly or thyroid tenderness.     Vascular: No carotid bruit.  Cardiovascular:     Rate and Rhythm: Normal rate and regular rhythm. No extrasystoles are present.    Pulses:          Dorsalis pedis pulses are 1+ on the right side and 1+ on the left side.     Heart sounds: Normal heart sounds. No murmur heard.    No friction rub. No gallop.  Pulmonary:     Effort: Pulmonary effort is normal.     Breath sounds: Normal breath sounds. No decreased breath sounds, wheezing, rhonchi or rales.  Chest:     Chest wall: No mass.  Abdominal:     Palpations: Abdomen is soft. There is no hepatomegaly, splenomegaly or mass.  Tenderness: There is no abdominal tenderness.     Hernia: No hernia is present.  Musculoskeletal:     Cervical back: Normal range of motion.     Right lower leg: No edema.     Left lower leg: No edema.  Lymphadenopathy:     Cervical: No cervical adenopathy.     Upper Body:     Right upper body: No supraclavicular adenopathy.     Left upper body: No supraclavicular adenopathy.  Skin:    General: Skin is warm and dry.  Neurological:     General: No focal deficit present.     Mental Status: She is alert and oriented to person, place, and time. Mental status is at baseline.     Sensory: Sensation is intact.     Motor: Motor function is intact. No weakness.     Deep Tendon Reflexes: Reflexes are normal and symmetric.  Psychiatric:        Attention and Perception: Attention normal.        Mood and Affect: Mood normal.        Speech:  Speech normal.        Behavior: Behavior normal.        Thought Content: Thought content normal.        Cognition and Memory: Cognition normal.        Judgment: Judgment normal.   Most Recent Fall Risk Assessment:    04/15/2023    9:39 AM  Fall Risk   Falls in the past year? 0  Number falls in past yr: 0  Injury with Fall? 0  Risk for fall due to : No Fall Risks  Follow up Falls evaluation completed;Education provided;Falls prevention discussed   Most Recent Depression Screenings:    04/15/2023    9:40 AM 05/19/2022    3:32 PM  PHQ 2/9 Scores  PHQ - 2 Score 0 0   Most Recent Cognitive Screening:    04/15/2023    9:44 AM  6CIT Screen  What Year? 0 points  What month? 0 points  What time? 0 points  Count back from 20 0 points  Months in reverse 0 points  Repeat phrase 0 points  Total Score 0 points   Results:  Studies Obtained And Personally Reviewed By Me:  Mammogram 05/22/2022 normal with repeat recommendation of 2025.  Colonoscopy 01/27/2006 found mild Diverticulosis in the sigmoid colon; otherwise normal.  Bone Density 06/02/2022 T-score Left Femoral Neck -1.3, osteopenic.  Labs:     Component Value Date/Time   NA 135 05/05/2023 0947   K 4.2 05/05/2023 0947   CL 96 (L) 05/05/2023 0947   CO2 28 05/05/2023 0947   GLUCOSE 81 05/05/2023 0947   BUN 15 05/05/2023 0947   CREATININE 0.67 05/05/2023 0947   CALCIUM 9.5 05/05/2023 0947   PROT 6.5 05/05/2023 0947   ALBUMIN 4.4 01/27/2019 1039   AST 15 05/05/2023 0947   AST 18 01/27/2019 1039   ALT 13 05/05/2023 0947   ALT 13 01/27/2019 1039   ALKPHOS 82 01/27/2019 1039   BILITOT 0.8 05/05/2023 0947   BILITOT 0.7 01/27/2019 1039   GFRNONAA 74 01/17/2020 1144   GFRAA 86 01/17/2020 1144    Lab Results  Component Value Date   WBC 6.8 05/05/2023   HGB 14.0 05/05/2023   HCT 41.7 05/05/2023   MCV 90.3 05/05/2023   PLT 343 05/05/2023   Lab Results  Component Value Date   CHOL 177 05/05/2023   HDL 69 05/05/2023  LDLCALC 87 05/05/2023   TRIG 110 05/05/2023   CHOLHDL 2.6 05/05/2023   Lab Results  Component Value Date   TSH 0.23 (L) 05/05/2023    Assessment & Plan:  Other Labs Reviewed today: CBC: WNL CMP, compared to 09/02/2022: Chloride 96, decreased from 100; otherwise normal.  Lipid Panel: WNL  Hypothyroidism treated with 125 mcg Levothyroxine daily. 05/06/2023 TSH, compared to 11/14/2022: 0.23, decreased significantly from 8.72. Defer management of hypothyroidism to Dr. Lowella Grip at [facility]. Pt will contact Dr. Jimmye Norman. Notes are in EPIC.  Anxiety/Depression treated with 0.5 mg Xanax nightly as needed.   Dependant Edema treated with 20 mg Demadex daily.   Rheumatoid Arthritis; Psoriatic Arthritis just recently started on 25 mg Indomethacin TID w/ meals - endorses improvement in pain. Was on Kineret before that, but was seen 03/2023 for injection site inflammatory cellulitis. Today, reports that her ROM in her shoulders is reduced, attributed to her arthritis, causing difficulty raising arms above her head which affects her ability getting dressed in the morning.    Restless Leg Syndrome treated with 0.5 mg Requip TID.   GYN appointment scheduled for 06/24/2023.   Mammogram 05/22/2022 normal with repeat recommendation of 2025.  Colonoscopy 01/27/2006 found mild Diverticulosis in the sigmoid colon; otherwise normal.  Bone Density 06/02/2022 T-score Left Femoral Neck -1.3, osteopenic.  Vaccine Counseling: Due for Covid-19 and Shingles 1/2; UTD on PNA and Tdap.   Annual wellness visit done today including the all of the following: Reviewed patient's Family Medical History Reviewed and updated list of patient's medical providers Assessment of cognitive impairment was done Assessed patient's functional ability Established a written schedule for health screening services Health Risk Assessent Completed and Reviewed  Discussed health benefits of physical activity, and encouraged her to  engage in regular exercise appropriate for her age and condition.    I,[name],acting as a Neurosurgeon for [name], MD.,have documented all relevant documentation on the behalf of [name], MD,as directed by  [name], MD while in the presence of [name], MD.   I, [name], MD, have reviewed all documentation for this visit. The documentation on 05/13/23 for the exam, diagnosis, procedures, and orders are all accurate and complete.

## 2023-05-13 ENCOUNTER — Telehealth: Payer: Self-pay | Admitting: Internal Medicine

## 2023-05-13 ENCOUNTER — Encounter: Payer: Self-pay | Admitting: Internal Medicine

## 2023-05-13 NOTE — Telephone Encounter (Signed)
 Called [name] to schedule a lab for [facility] and she reminded me that she is now seeing an Endocrinologist with [facility] and they will be following up for her thyroid.

## 2023-05-13 NOTE — Patient Instructions (Signed)
 Decrease Levothyroxine to 0.112 mg daily and RTC in 6 weeks for repeat TSH. Continue follow up with [facility].

## 2023-05-15 DIAGNOSIS — E890 Postprocedural hypothyroidism: Secondary | ICD-10-CM | POA: Diagnosis not present

## 2023-05-15 DIAGNOSIS — E039 Hypothyroidism, unspecified: Secondary | ICD-10-CM | POA: Diagnosis not present

## 2023-05-25 ENCOUNTER — Encounter: Payer: Self-pay | Admitting: Physician Assistant

## 2023-05-25 ENCOUNTER — Other Ambulatory Visit (INDEPENDENT_AMBULATORY_CARE_PROVIDER_SITE_OTHER): Payer: Self-pay

## 2023-05-25 ENCOUNTER — Ambulatory Visit (INDEPENDENT_AMBULATORY_CARE_PROVIDER_SITE_OTHER): Payer: Medicare Other | Admitting: Physician Assistant

## 2023-05-25 DIAGNOSIS — M25512 Pain in left shoulder: Secondary | ICD-10-CM

## 2023-05-25 DIAGNOSIS — Z96641 Presence of right artificial hip joint: Secondary | ICD-10-CM

## 2023-05-25 DIAGNOSIS — G8929 Other chronic pain: Secondary | ICD-10-CM | POA: Diagnosis not present

## 2023-05-25 DIAGNOSIS — M7061 Trochanteric bursitis, right hip: Secondary | ICD-10-CM

## 2023-05-25 DIAGNOSIS — M25511 Pain in right shoulder: Secondary | ICD-10-CM | POA: Diagnosis not present

## 2023-05-25 MED ORDER — METHYLPREDNISOLONE ACETATE 40 MG/ML IJ SUSP
40.0000 mg | INTRAMUSCULAR | Status: AC | PRN
  Administered 2023-05-25: 40 mg via INTRA_ARTICULAR

## 2023-05-25 MED ORDER — LIDOCAINE HCL 1 % IJ SOLN
3.0000 mL | INTRAMUSCULAR | Status: AC | PRN
  Administered 2023-05-25: 3 mL

## 2023-05-25 NOTE — Progress Notes (Signed)
 Office Visit Note   Patient: [name]           Date of Birth: 02/16/1946           MRN: [id number] Visit Date: 05/25/2023              Requested by: [name], MD [address] [place],  [place] [id number] PCP: [name], MD   Assessment & Plan: Visit Diagnoses:  1. Chronic right shoulder pain   2. Chronic left shoulder pain   3. Hx of total hip arthroplasty, right   4. Trochanteric bursitis, right hip     Plan: Will have her follow-up with [place] in 2 weeks to see how she is doing overall.  She is shown shoulder exercises and IT band stretching exercises.  She can continue to take her ibuprofen.  Follow-Up Instructions: Return in about 2 weeks (around 06/08/2023).   Orders:  Orders Placed This Encounter  Procedures   Large Joint Inj: L subacromial bursa   XR Shoulder Right   XR Shoulder Left   XR HIP UNILAT W OR W/O PELVIS 2-3 VIEWS RIGHT   No orders of the defined types were placed in this encounter.     Procedures: Large Joint Inj: L subacromial bursa on 05/25/2023 10:08 AM Indications: pain Details: 22 G 1.5 in needle, superior approach  Arthrogram: No  Medications: 3 mL lidocaine 1 %; 40 mg methylPREDNISolone acetate 40 MG/ML Outcome: tolerated well, no immediate complications Procedure, treatment alternatives, risks and benefits explained, specific risks discussed. Consent was given by the patient. Immediately prior to procedure a time out was called to verify the correct patient, procedure, equipment, support staff and site/side marked as required. Patient was prepped and draped in the usual sterile fashion.       Clinical Data: No additional findings.   Subjective: No chief complaint on file.   HPI [name] comes in today due to bilateral shoulder pain.  Left worse than right.  She states that she has been playing a lot of pickle ball.  She also has arthritic changes in both hands and cannot push-up with her hands therefore has  to push-up with her elbows which puts a lot of pressure on both shoulders.  She has had no injury to either shoulder.  She has difficulty with overhead motion particularly with dressing.  Left shoulder does awaken her some nights.  Denies any radicular symptoms down either arm.  She is left-hand dominant.  Right hip pain the lateral aspect.  She states she has a "zinging" when transferring from a sitting to standing position.  Not been able to lie on the hip since she underwent a right total hip arthroplasty 07/02/2012.  Denies any groin pain.  She states she has pain whenever she sits for long period of time.  She has been taking ibuprofen for the hip and bilateral shoulder pain.  Review of Systems  Constitutional:  Negative for chills and fever.  Musculoskeletal:  Positive for arthralgias.     Objective: Vital Signs: There were no vitals taken for this visit.  Physical Exam Constitutional:      Appearance: She is not ill-appearing or diaphoretic.  Pulmonary:     Effort: Pulmonary effort is normal.  Neurological:     Mental Status: She is alert and oriented to person, place, and time.  Psychiatric:        Mood and Affect: Mood normal.     Ortho Exam Bilateral hips good  range of motion.  Discomfort with external rotation of the right hip only.  Tenderness over the right hip trochanteric region.  Minimal tenderness over the left hip trochanteric region.  Bilateral shoulders full overhead motion actively.  Discomfort right shoulder with abduction and overhead motion.  No discomfort out of shoulder with abduction across the chest.  5 out of 5 strength bilaterally with external and internal rotation against resistance.  Empty can test is negative bilaterally.  Liftoff test negative on the right positive on the left.  Impingement testing positive on the left.  Specialty Comments:  No specialty comments available.  Imaging: XR HIP UNILAT W OR W/O PELVIS 2-3 VIEWS RIGHT Result Date:  05/25/2023 AP pelvis lateral view right hip: Status post right total hip arthroplasty.  Hips well located.  No acute fractures acute findings.  Hip cup slightly vertical but unchanged from prior images.  XR Shoulder Left Result Date: 05/25/2023 Left shoulder 3 views: Shoulder is well located.  Glenohumeral joint is well-maintained.  Very mild arthritic changes humeral head.  Mild AC joint changes.  No acute fractures acute findings otherwise.  XR Shoulder Right Result Date: 05/25/2023 Right shoulder 3 views: Shoulder is well located.  Mild glenohumeral joint changes.  Moderate AC joint changes.  No acute fractures or acute findings.     PMFS History: Patient Active Problem List   Diagnosis Date Noted   Psoriatic arthritis (HCC) 03/14/2019   Chronic venous insufficiency 11/25/2018   Dependent edema 09/04/2018   Asymptomatic postmenopausal estrogen deficiency 01/03/2018   History of hip replacement, total, unspecified laterality 06/22/2017   Bilateral sensorineural hearing loss 01/09/2016   Bilateral tinnitus 01/09/2016   Age-related nuclear cataract of both eyes 09/21/2015   Choroidal nevus, left eye 09/21/2015   Chronic dryness of both eyes 09/21/2015   Dermatochalasis of eyelid 09/21/2015   Presbyopia of both eyes 09/21/2015   PVD (posterior vitreous detachment), both eyes 09/21/2015   Regular astigmatism of both eyes 09/21/2015   Lateral cystocele 07/23/2015   Rectocele 07/23/2015   Osteoarthritis of right hip 05/24/2012   GE reflux 09/23/2011   Hypothyroidism 01/07/2011   Anxiety 01/07/2011   Allergic rhinitis 01/07/2011   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   Past Medical History:  Diagnosis Date   Allergic bronchitis    Allergic bronchitis    Allergy    Arthritis    Claustrophobia    Dependent edema    GERD (gastroesophageal reflux disease)    occas problem - burning, cough would take prilosec if needed   H/O hiatal hernia    HSV-2 (herpes simplex virus 2)  infection    Hypothyroidism    Peripheral neuropathy    3 toes right foot 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 problem   Psoriatic arthritis (HCC) 02/18/2019   Thyroid disease     Family History  Problem Relation Age of Onset   Heart disease Father    Hypertension Sister    Heart disease Brother    Asthma Daughter    Hypertension Sister     Past Surgical History:  Procedure Laterality Date   BACK SURGERY     BREAST BIOPSY  1972   benign   CARPAL TUNNEL RELEASE Left 12/17/2018   Procedure: LEFT CARPAL TUNNEL RELEASE;  Surgeon: Betha Loa, MD;  Location: [facility];  Service: Orthopedics;  Laterality: Left;   CHOLECYSTECTOMY     LUMBAR LAMINECTOMY  1999   L4   LUMBAR LAMINECTOMY  1981   L5  TOTAL HIP ARTHROPLASTY Right 07/02/2012   Procedure: RIGHT TOTAL HIP ARTHROPLASTY ANTERIOR APPROACH;  Surgeon: Kathryne Hitch, MD;  Location: [facility];  Service: Orthopedics;  Laterality: Right;   Social History   Occupational History   Not on file  Tobacco Use   Smoking status: Never   Smokeless tobacco: Never  Vaping Use   Vaping status: Never Used  Substance and Sexual Activity   Alcohol use: Yes    Comment: socially   Drug use: No   Sexual activity: Not on file

## 2023-06-04 DIAGNOSIS — M1990 Unspecified osteoarthritis, unspecified site: Secondary | ICD-10-CM | POA: Diagnosis not present

## 2023-06-04 DIAGNOSIS — M0600 Rheumatoid arthritis without rheumatoid factor, unspecified site: Secondary | ICD-10-CM | POA: Diagnosis not present

## 2023-06-04 DIAGNOSIS — Z79899 Other long term (current) drug therapy: Secondary | ICD-10-CM | POA: Diagnosis not present

## 2023-06-04 DIAGNOSIS — M1129 Other chondrocalcinosis, multiple sites: Secondary | ICD-10-CM | POA: Diagnosis not present

## 2023-06-05 ENCOUNTER — Other Ambulatory Visit: Payer: Self-pay | Admitting: Internal Medicine

## 2023-06-08 ENCOUNTER — Ambulatory Visit: Admitting: Physician Assistant

## 2023-06-08 DIAGNOSIS — H60311 Diffuse otitis externa, right ear: Secondary | ICD-10-CM | POA: Diagnosis not present

## 2023-06-24 DIAGNOSIS — M064 Inflammatory polyarthropathy: Secondary | ICD-10-CM | POA: Diagnosis not present

## 2023-06-24 DIAGNOSIS — N952 Postmenopausal atrophic vaginitis: Secondary | ICD-10-CM | POA: Diagnosis not present

## 2023-06-24 DIAGNOSIS — N816 Rectocele: Secondary | ICD-10-CM | POA: Diagnosis not present

## 2023-06-24 DIAGNOSIS — Z1231 Encounter for screening mammogram for malignant neoplasm of breast: Secondary | ICD-10-CM | POA: Diagnosis not present

## 2023-06-24 DIAGNOSIS — N8112 Cystocele, lateral: Secondary | ICD-10-CM | POA: Diagnosis not present

## 2023-06-24 DIAGNOSIS — B009 Herpesviral infection, unspecified: Secondary | ICD-10-CM | POA: Diagnosis not present

## 2023-06-24 DIAGNOSIS — L4050 Arthropathic psoriasis, unspecified: Secondary | ICD-10-CM | POA: Diagnosis not present

## 2023-06-24 DIAGNOSIS — Z01419 Encounter for gynecological examination (general) (routine) without abnormal findings: Secondary | ICD-10-CM | POA: Diagnosis not present

## 2023-06-24 LAB — HM MAMMOGRAPHY

## 2023-06-29 ENCOUNTER — Encounter: Payer: Self-pay | Admitting: Internal Medicine

## 2023-07-16 DIAGNOSIS — E890 Postprocedural hypothyroidism: Secondary | ICD-10-CM | POA: Diagnosis not present

## 2023-08-03 ENCOUNTER — Other Ambulatory Visit: Payer: Self-pay | Admitting: Internal Medicine

## 2023-08-27 DIAGNOSIS — S8992XD Unspecified injury of left lower leg, subsequent encounter: Secondary | ICD-10-CM | POA: Diagnosis not present

## 2023-08-27 DIAGNOSIS — M1129 Other chondrocalcinosis, multiple sites: Secondary | ICD-10-CM | POA: Diagnosis not present

## 2023-08-27 DIAGNOSIS — M25562 Pain in left knee: Secondary | ICD-10-CM | POA: Diagnosis not present

## 2023-08-27 DIAGNOSIS — M159 Polyosteoarthritis, unspecified: Secondary | ICD-10-CM | POA: Diagnosis not present

## 2023-08-27 DIAGNOSIS — S8992XA Unspecified injury of left lower leg, initial encounter: Secondary | ICD-10-CM | POA: Diagnosis not present

## 2023-08-27 DIAGNOSIS — Z79899 Other long term (current) drug therapy: Secondary | ICD-10-CM | POA: Diagnosis not present

## 2023-08-27 DIAGNOSIS — M1909 Primary osteoarthritis, other specified site: Secondary | ICD-10-CM | POA: Diagnosis not present

## 2023-08-27 DIAGNOSIS — Z7952 Long term (current) use of systemic steroids: Secondary | ICD-10-CM | POA: Diagnosis not present

## 2023-08-27 DIAGNOSIS — M150 Primary generalized (osteo)arthritis: Secondary | ICD-10-CM | POA: Diagnosis not present

## 2023-08-28 ENCOUNTER — Other Ambulatory Visit: Payer: Self-pay | Admitting: Internal Medicine

## 2023-10-05 ENCOUNTER — Other Ambulatory Visit: Payer: Self-pay | Admitting: Internal Medicine

## 2023-11-25 DIAGNOSIS — M1909 Primary osteoarthritis, other specified site: Secondary | ICD-10-CM | POA: Diagnosis not present

## 2023-11-25 DIAGNOSIS — Z79899 Other long term (current) drug therapy: Secondary | ICD-10-CM | POA: Diagnosis not present

## 2023-11-25 DIAGNOSIS — M150 Primary generalized (osteo)arthritis: Secondary | ICD-10-CM | POA: Diagnosis not present

## 2023-11-25 DIAGNOSIS — M1990 Unspecified osteoarthritis, unspecified site: Secondary | ICD-10-CM | POA: Diagnosis not present

## 2023-12-02 DIAGNOSIS — E039 Hypothyroidism, unspecified: Secondary | ICD-10-CM | POA: Diagnosis not present

## 2023-12-02 DIAGNOSIS — E890 Postprocedural hypothyroidism: Secondary | ICD-10-CM | POA: Diagnosis not present

## 2023-12-15 ENCOUNTER — Encounter: Payer: Self-pay | Admitting: Internal Medicine

## 2023-12-15 ENCOUNTER — Telehealth: Payer: Self-pay | Admitting: Internal Medicine

## 2023-12-17 ENCOUNTER — Encounter: Payer: Self-pay | Admitting: Internal Medicine

## 2023-12-17 ENCOUNTER — Ambulatory Visit (INDEPENDENT_AMBULATORY_CARE_PROVIDER_SITE_OTHER): Admitting: Internal Medicine

## 2023-12-17 VITALS — HR 73 | Ht 62.0 in | Wt 146.0 lb

## 2023-12-17 DIAGNOSIS — F439 Reaction to severe stress, unspecified: Secondary | ICD-10-CM

## 2023-12-17 MED ORDER — BUPROPION HCL ER (XL) 150 MG PO TB24: 150.0000 mg | ORAL_TABLET | Freq: Every day | ORAL | 1 refills | Status: DC

## 2023-12-17 NOTE — Progress Notes (Signed)
 Patient Care Team: Perri Ronal PARAS, MD as PCP - General (Internal Medicine) Lonni Slain, MD as PCP - Cardiology (Cardiology)  Visit Date: 12/17/23  Subjective:    Patient ID: [name] , Female   DOB: Jul 05, 1945, 78 y.o.    MRN: [id number]   78 y.o. Female presents today for an office visit for situational stress. Patient has a past medical history of Hypothyroidism, Anxiety/Depression/ Dependent edema, Rheumatoid Arthritis.  She has been experiencing some situational stress. Her daughter was living out of town and recently moved back home and her husband has recently had hip surgery and seems to be unhappy. 30 minutes was spent strategizing about different coping mechanisms as this is causing her situational depression.     History of Anxiety/Depression treated with 0.5 mg Xanax  nightly as needed     Past Medical History:  Diagnosis Date   Allergic bronchitis    Allergic bronchitis    Allergy     Arthritis    Claustrophobia    Dependent edema    GERD (gastroesophageal reflux disease)    occas problem - burning, cough would take prilosec if needed   H/O hiatal hernia    HSV-2 (herpes simplex virus 2) infection    Hypothyroidism    Peripheral neuropathy    3 toes right foot 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 problem   Psoriatic arthritis (HCC) 02/18/2019   Thyroid  disease      Family History  Problem Relation Age of Onset   Heart disease Father    Hypertension Sister    Heart disease Brother    Asthma Daughter    Hypertension Sister     Social History   Social History Narrative   Married - husband is a retired Best boy at a Buyer, retail of [employer]. 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 Non-smoker. Social alcohol consumption. Lost a stepson to suicide in 2018. 2 adult daughters from her previous marriage.      Review of Systems  All other systems reviewed and are negative.       Objective:   Vitals:  Pulse 73   Ht 5' 2 (1.575 m)   Wt 146 lb (66.2 kg)   SpO2 97%   BMI 26.70 kg/m    Physical Exam Vitals and nursing note reviewed.       Results:    Labs:       Component Value Date/Time   NA 135 05/05/2023 0947   K 4.2 05/05/2023 0947   CL 96 (L) 05/05/2023 0947   CO2 28 05/05/2023 0947   GLUCOSE 81 05/05/2023 0947   BUN 15 05/05/2023 0947   CREATININE 0.67 05/05/2023 0947   CALCIUM 9.5 05/05/2023 0947   PROT 6.5 05/05/2023 0947   ALBUMIN 4.4 01/27/2019 1039   AST 15 05/05/2023 0947   AST 18 01/27/2019 1039   ALT 13 05/05/2023 0947   ALT 13 01/27/2019 1039   ALKPHOS 82 01/27/2019 1039   BILITOT 0.8 05/05/2023 0947   BILITOT 0.7 01/27/2019 1039   GFRNONAA 74 01/17/2020 1144   GFRAA 86 01/17/2020 1144     Lab Results  Component Value Date   WBC 6.8 05/05/2023   HGB 14.0 05/05/2023   HCT 41.7 05/05/2023   MCV 90.3 05/05/2023   PLT 343 05/05/2023    Lab Results  Component Value Date   CHOL 177 05/05/2023   HDL 69 05/05/2023   LDLCALC 87 05/05/2023  TRIG 110 05/05/2023   CHOLHDL 2.6 05/05/2023      Lab Results  Component Value Date   TSH 0.23 (L) 05/05/2023      Assessment & Plan:   Situational stress/depression: She has been experiencing some situational stress. Her daughter was living out of town and recently moved back home. Patient's husband has recently had hip surgery and seems to be unhappy. 30 minutes was spent strategizing about different coping mechanisms as this is causing her situational depression.    Wellbutrin  XL 150 mg daily prescribed. She has taken it previously and has done well on it. Will follow up in a few weeks.   Anxiety/Depression: treated with 0.5 mg Xanax  nightly as needed    Return in about 7 weeks (around 02/04/2024).    I,Makayla C Reid,acting as a scribe for Ronal JINNY Hailstone, MD.,have documented all relevant documentation on the behalf of Ronal JINNY Hailstone, MD,as directed by  Ronal JINNY Hailstone, MD while in the presence of  Ronal JINNY Hailstone, MD.  40 minutes spent with patient discussing situation, strategies for dealing with the situation, and  medication selection discussed

## 2023-12-21 ENCOUNTER — Telehealth: Admitting: Physician Assistant

## 2023-12-21 ENCOUNTER — Ambulatory Visit: Admitting: Internal Medicine

## 2023-12-21 ENCOUNTER — Ambulatory Visit: Payer: Self-pay

## 2023-12-21 DIAGNOSIS — B349 Viral infection, unspecified: Secondary | ICD-10-CM

## 2023-12-21 DIAGNOSIS — J029 Acute pharyngitis, unspecified: Secondary | ICD-10-CM | POA: Diagnosis not present

## 2023-12-21 DIAGNOSIS — R52 Pain, unspecified: Secondary | ICD-10-CM

## 2023-12-21 LAB — POC COVID19/FLU A&B COMBO
Covid Antigen, POC: NEGATIVE
Influenza A Antigen, POC: NEGATIVE
Influenza B Antigen, POC: NEGATIVE

## 2023-12-21 LAB — POCT RESPIRATORY SYNCYTIAL VIRUS: RSV Rapid Ag: NEGATIVE

## 2023-12-21 LAB — POCT RAPID STREP A (OFFICE): Rapid Strep A Screen: NEGATIVE

## 2023-12-21 MED ORDER — BENZONATATE 100 MG PO CAPS: 100.0000 mg | ORAL_CAPSULE | Freq: Two times a day (BID) | ORAL | 0 refills | Status: DC | PRN

## 2023-12-21 MED ORDER — AZITHROMYCIN 250 MG PO TABS: ORAL_TABLET | ORAL | 0 refills | Status: AC

## 2023-12-21 MED ORDER — NAPROXEN 500 MG PO TABS: 500.0000 mg | ORAL_TABLET | Freq: Two times a day (BID) | ORAL | 0 refills | Status: DC

## 2023-12-21 NOTE — Progress Notes (Signed)
 E-Visit for [employer] Virus / COVID Screening  Your current symptoms could be consistent with COVID.  Please complete a Covid test either at home or check with your local pharmacy to see if they provide testing.    You have tested positive for COVID-19, meaning that you were infected with the novel coronavirus and could give the virus to others.  Most people with COVID-19 have mild illness and can recover at home without medical care. Do not leave your home, except to get medical care. Do not visit public areas and do not go to places where you are unable to wear a mask. It is important that you stay home  to take care for yourself and to help protect other people in your home and community.      Isolation Instructions:   You are to isolate at home until you have been fever free for at least 24 hours without a fever-reducing medication, and symptoms have been steadily improving for 24 hours. At that time,  you can end isolation but need to mask for an additional 5 days.  If you must be around other household members who do not have symptoms, you need to make sure that both you and the family members are masking consistently with a high-quality mask.  If you note any worsening of symptoms despite treatment, please seek an in-person evaluation ASAP. If you note any significant shortness of breath or any chest pain, please seek ER evaluation. Please do not delay care!  Go to the nearest hospital ED for assessment if fever/cough/breathlessness are severe or illness seems like a threat to life.    The following symptoms may appear 2-14 days after exposure: Fever Cough Shortness of breath or difficulty breathing Chills Repeated shaking with chills Muscle pain Headache Sore throat New loss of taste or smell Fatigue Congestion or runny nose Nausea or vomiting Diarrhea  You can use medication such as I have prescribed Tessalon  Perles 100 mg. You may take 1-2 capsules every 8 hours as needed for  cough and I have prescribed an anti-inflammatory - Naprosyn 500 mg. Take twice daily as needed for fever or body aches for 2 weeks  You may also take acetaminophen  (Tylenol ) as needed for fever.  HOME CARE Only take medications as instructed by your medical team. Drink plenty of fluids and get plenty of rest. A steam or ultrasonic humidifier can help if you have congestion.  GET HELP RIGHT AWAY IF YOU HAVE EMERGENCY WARNING SIGNS.  Call 911 or proceed to your closest emergency facility if: You develop worsening high fever. Trouble breathing Bluish lips or face Persistent pain or pressure in the chest New confusion Inability to wake or stay awake You cough up blood. Your symptoms become more severe Inability to hold down food or fluids  This list is not all possible symptoms. Contact your medical provider for any symptoms that are severe or concerning to you.   Your e-visit answers were reviewed by a board certified advanced clinical practitioner to complete your personal care plan.  Depending on the condition, your plan could have included both over the counter or prescription medications.  If there is a problem, please reply once you have received a response from your provider.  Your safety is important to us .  If you have drug allergies check your prescription carefully.    You can use MyChart to ask questions about today's visit, request a non-urgent call back, or ask for a work or school excuse for  24 hours related to this e-Visit. If it has been greater than 24 hours you will need to follow up with your provider or enter a new e-Visit to address those concerns. You will get an e-mail in the next two days asking about your experience.  I hope that your e-visit has been valuable and will speed your recovery. Thank you for using e-visits.    I have spent 5 minutes in review of e-visit questionnaire, review and updating patient chart, medical decision making and response to patient.    Delon CHRISTELLA Dickinson, PA-C

## 2023-12-21 NOTE — Progress Notes (Signed)
 Patient Care Team: Perri Ronal PARAS, MD as PCP - General (Internal Medicine) Lonni Slain, MD as PCP - Cardiology (Cardiology)  Visit Date: 12/21/23  Subjective:    Patient ID: [name] , Female   DOB: [birth year], 78 y.o.    MRN: [id number]   78 y.o. Female presents today in person for an acute visit for sore throat, cough,and headache. Patient has a past medical history of Hypothyroidism and Psoriatic arthritis.  She went to a ball game at [employer] on Saturday and began to feel sick on Saturday. She has a cough, headache, and a sore throat. She says that  nobody else in her house is sick. Covid-19, Influenza, Strep and RSV tests were all negative.    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 Patient is a former Engineer, civil (consulting). Nonsmoker. Husband is a retired Best boy. They reside in [facility].     Review of Systems  HENT:  Positive for sore throat.   Respiratory:  Positive for cough.   Neurological:  Positive for headaches.        Objective:   Vitals: There were no vitals taken for this visit.   Physical Exam Vitals and nursing note reviewed.  Constitutional:      General: She is not in acute distress.    Appearance: Normal appearance. She is not ill-appearing.  HENT:     Head: Normocephalic and atraumatic.     Right Ear:  Tympanic membrane, ear canal and external ear normal.     Left Ear: Tympanic membrane, ear canal and external ear normal.     Mouth/Throat:     Mouth: Mucous membranes are moist.     Pharynx: Oropharynx is clear. Posterior oropharyngeal erythema present. No oropharyngeal exudate.  Skin:    General: Skin is warm and dry.  Neurological:     Mental Status: She is alert and oriented to person, place, and time. Mental status is at baseline.  Psychiatric:        Mood and Affect: Mood normal.        Behavior: Behavior normal.        Thought Content: Thought content normal.        Judgment: Judgment normal.       Results:     Labs:       Component Value Date/Time   NA 135 05/05/2023 0947   K 4.2 05/05/2023 0947   CL 96 (L) 05/05/2023 0947   CO2 28 05/05/2023 0947   GLUCOSE 81 05/05/2023 0947   BUN 15 05/05/2023 0947   CREATININE 0.67 05/05/2023 0947   CALCIUM 9.5 05/05/2023 0947   PROT 6.5 05/05/2023 0947   ALBUMIN 4.4 01/27/2019  1039   AST 15 05/05/2023 0947   AST 18 01/27/2019 1039   ALT 13 05/05/2023 0947   ALT 13 01/27/2019 1039   ALKPHOS 82 01/27/2019 1039   BILITOT 0.8 05/05/2023 0947   BILITOT 0.7 01/27/2019 1039   GFRNONAA 74 01/17/2020 1144   GFRAA 86 01/17/2020 1144     Lab Results  Component Value Date   WBC 6.8 05/05/2023   HGB 14.0 05/05/2023   HCT 41.7 05/05/2023   MCV 90.3 05/05/2023   PLT 343 05/05/2023    Lab Results  Component Value Date   CHOL 177 05/05/2023   HDL 69 05/05/2023   LDLCALC 87 05/05/2023   TRIG 110 05/05/2023   CHOLHDL 2.6 05/05/2023      Lab Results  Component Value Date   TSH 0.23 (L) 05/05/2023          Assessment & Plan:  Acute viral syndrome  Myalgias- She went to a ball game at [facility] on Saturday and began to feel ill on Saturday. She has a cough, headache, sore throat and body aches. She says that  nobody else in her home is  currently sick.  She does take Humira for Inflammatory Arthritis  Performed   Covid-19, Influenza,  Group A Strep and RSV testing today. Results were all negative.  Plan: She feels ill and the weekend is coming. I would like for her to take Azithromycin  250 mg 2 tablets on the first day, 1 tablet on days 2-5 prescribed. Rest and stay well hydrated.   I,Makayla C Reid,acting as a scribe for Ronal JINNY Hailstone, MD.,have documented all relevant documentation on the behalf of Ronal JINNY Hailstone, MD,as directed by  Ronal JINNY Hailstone, MD while in the presence of Ronal JINNY Hailstone, MD.

## 2023-12-21 NOTE — Telephone Encounter (Signed)
 FYI Only or Action Required?: FYI only for provider.  Patient was last seen in primary care on 12/17/2023 by Perri Ronal PARAS, MD.  Called Nurse Triage reporting Sore Throat.  Symptoms began yesterday.  Interventions attempted: Nothing.  Symptoms are: gradually worsening.  Triage Disposition: See PCP When Office is Open (Within 3 Days)  Patient/caregiver understands and will follow disposition?: Yes    Copied from CRM [id number]. Topic: Clinical - Red Word Triage >> Dec 21, 2023 11:02 AM Avram MATSU wrote: Red Word that prompted transfer to Nurse Triage: sore throat, body aches, headache. Reason for Disposition  [1] Sore throat is the only symptom AND [2] present > 48 hours  Answer Assessment - Initial Assessment Questions 1. ONSET: When did the throat start hurting? (Hours or days ago)      yesterday 2. SEVERITY: How bad is the sore throat? (Scale 1-10; mild, moderate or severe)     Moderate  3. STREP EXPOSURE: Has there been any exposure to strep within the past week? If Yes, ask: What type of contact occurred?      no 4.  VIRAL SYMPTOMS: Are there any symptoms of a cold, such as a runny nose, cough, hoarse voice or red eyes?      Sore throat-red, cough,  5. FEVER: Do you have a fever? If Yes, ask: What is your temperature, how was it measured, and when did it start?     99.7 6. PUS ON THE TONSILS: Is there pus on the tonsils in the back of your throat?     no 7. OTHER SYMPTOMS: Do you have any other symptoms? (e.g., difficulty breathing, headache, rash)     Headache, body aches, chills 8. PREGNANCY: Is there any chance you are pregnant? When was your last menstrual period?     na  Protocols used: Sore Throat-A-AH

## 2023-12-24 ENCOUNTER — Encounter: Payer: Self-pay | Admitting: Internal Medicine

## 2023-12-24 NOTE — Patient Instructions (Signed)
 Patient has a framework for dealing with situational stress. Start Welbutrin XL 150 mg daily. Continue Xanax  nightly as needed. Return around November 20th.

## 2023-12-28 ENCOUNTER — Other Ambulatory Visit: Payer: Self-pay | Admitting: Internal Medicine

## 2024-01-03 ENCOUNTER — Encounter: Payer: Self-pay | Admitting: Internal Medicine

## 2024-01-03 NOTE — Patient Instructions (Signed)
 Likely had exposure at recent sporting event she attended. Has tested negative today for Flu, Covid-19,Flu and Group A strep. Rest at home and stay well hydrated. Is on DMARD therapy. We have agreed she will take Zithromax  Z pak.

## 2024-01-18 ENCOUNTER — Encounter: Payer: Self-pay | Admitting: Radiology

## 2024-01-28 NOTE — Progress Notes (Addendum)
 Patient Care Team: Perri Ronal PARAS, MD as PCP - General (Internal Medicine) Lonni Slain, MD as PCP - Cardiology (Cardiology)  Visit Date: 02/04/24  Subjective:    Patient ID: [name] , Female   DOB: 1946/01/14, 78 y.o.    MRN: [id number]   78 y.o. Female presents today to discuss Humira and  to follow up on anxiety and stress. Patient has a past medical history of Hypothyroidism, Anxiety, Dependent edema  and inflammatory arthritis.  History of Inflammatory Arthritis. She is followed by Dr. Heron at [facility] where she has been receiving Humira injections every 2 weeks. She has not felt well after receiving these injection. She has considered discontinuing Humira but she says it helping. She is physically active on a regular basis. March 2020 Serologies were negative for rheumatoid factor, HLA-B27, CCP, ANA , had slightly elevated uric acid 7.8, thrombocytosis 536,00, and eosinophilia ,and CRP 62 Currently being treated with prednisone  bursts every few months, and daily ibuprofen. She has previously tried Methotrexate (discontinued due to GI distress) , Simponi  (discontinued due to rash),  leflunomide (discontinued due to diarrhea),  Celebrex  (discontinued due to edema), Remicade  (discontinued due to infusion issues).     History of Anxiety and situational stress treated with Wellbutrin  XL 150 mg daily and Xanax  0.5 mg at bedtime. Her daughter is now living with patient and her husband which has caused some situation stress. We discussed this. The situation has gotten better. She hopes to move daughter out of home in next few months.    History of Hypothyroidism  currently treated with Synthroid  125 mcg daily. Feb. TSH was low at 0.23 but feels well on this dose.  She said she has cough and sinus drainage for 5 days. She believes it is a reaction to Humira. She has taken a Covid-19 test. She was not prescribed an antibiotic as she feels she dose not need it.  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 Patient used to work as a [employer] but no longer works outside the home. Non-smoker. Social alcohol consumption. Lost a stepson to suicide in 2018. 2 adult daughters from her previous marriage.      Review of Systems  HENT:         Sinus drainage  Respiratory:  Positive for cough.         Objective:   Vitals: BP 110/62   Pulse 62   Ht 5' 4.5 (1.638 m)   Wt 142 lb 12.8 oz (64.8 kg)   SpO2 98%   BMI 24.13 kg/m  Physical Exam Vitals and nursing note reviewed.  Constitutional:      General: She is not in acute distress.    Appearance: Normal appearance. She is not toxic-appearing.  HENT:     Head: Normocephalic and atraumatic.     Mouth/Throat:     Pharynx: Oropharynx is clear.  Cardiovascular:     Rate and Rhythm: Normal rate and regular rhythm. No extrasystoles are present.    Pulses: Normal pulses.     Heart sounds: Normal heart sounds. No murmur heard.    No friction rub. No gallop.  Pulmonary:     Effort: Pulmonary effort is normal. No respiratory distress.     Breath sounds: Normal breath sounds. No wheezing or rales.  Skin:    General: Skin is warm and dry.  Neurological:     Mental  Status: She is alert and oriented to person, place, and time. Mental status is at baseline.  Psychiatric:        Mood and Affect: Mood normal.        Behavior: Behavior normal.        Thought Content: Thought content normal.        Judgment: Judgment normal.       Results:     Labs:       Component Value Date/Time   NA 135 05/05/2023 0947   K 4.2 05/05/2023 0947   CL 96 (L) 05/05/2023 0947   CO2 28 05/05/2023 0947   GLUCOSE 81 05/05/2023 0947   BUN 15 05/05/2023 0947   CREATININE 0.67 05/05/2023 0947   CALCIUM 9.5 05/05/2023 0947   PROT 6.5 05/05/2023 0947   ALBUMIN 4.4 01/27/2019 1039   AST 15 05/05/2023 0947   AST 18 01/27/2019 1039   ALT 13 05/05/2023 0947   ALT 13 01/27/2019 1039   ALKPHOS 82 01/27/2019 1039   BILITOT 0.8 05/05/2023 0947   BILITOT 0.7 01/27/2019 1039   GFRNONAA 74 01/17/2020 1144   GFRAA 86 01/17/2020 1144     Lab Results  Component Value Date   WBC 6.8 05/05/2023   HGB 14.0 05/05/2023   HCT 41.7 05/05/2023   MCV 90.3 05/05/2023   PLT 343 05/05/2023    Lab Results  Component Value Date   CHOL 177 05/05/2023   HDL 69 05/05/2023   LDLCALC 87 05/05/2023   TRIG 110 05/05/2023   CHOLHDL 2.6 05/05/2023     Lab Results  Component Value Date   TSH 0.23 (L) 05/05/2023        Assessment & Plan:   Inflammatory Arthritis: She is followed by Dr. Heron at [facility] where she has been receiving Humira injections every 2 weeks. She has not felt well after receiving these injection. She has considered discontinuing Humira but she says it helping. She is physically active on a regular basis. March 2020 Serologies were negative for rheumatoid factor, HLA-B27, CCP, ANA , initial slightly elevated uric acid 7.8, thrombocytosis 536 and eosinophilia 1.3, and CRP 62 Currently being treated with prednisone  bursts every few months, and daily ibuprofen. She has previously tried Methotrexate (discontinued due to GI distress) , Simponi   (discontinued due to rash),  leflunomide (discontinued due to diarrhea),  Celebrex  (discontinued due to edema), Remicade  (discontinued due to infusion issues).     Anxiety and situational stress: treated with Wellbutrin  XL 150 mg daily and Xanax  0.5 mg at bedtime. Her daughter is now living with her which has caused some situation stress. We discussed this. The situation has  gotten better. She hopes to get daughter out of home in next few months.    Hypothyroidism: treated with Synthroid  125 mcg daily.   She said she has cough and sinus drainage for 5 days. She believes it is a reaction to Humira. She has taken a Covid-19 test. She was not prescribed an antibiotic as she feels she does not need it.   I,Makayla C Reid,acting as a scribe for Ronal JINNY Hailstone, MD.,have documented all relevant documentation on the behalf of Ronal JINNY Hailstone, MD,as directed by  Ronal JINNY Hailstone, MD while in the presence of Ronal JINNY Hailstone, MD.   I, Ronal JINNY Hailstone, MD, have reviewed all documentation for this visit. The documentation on 02/04/2024 for the exam, diagnosis, procedures, and orders are all accurate and complete.

## 2024-02-03 ENCOUNTER — Encounter: Payer: Self-pay | Admitting: Internal Medicine

## 2024-02-04 ENCOUNTER — Ambulatory Visit (INDEPENDENT_AMBULATORY_CARE_PROVIDER_SITE_OTHER): Payer: Self-pay | Admitting: Internal Medicine

## 2024-02-04 VITALS — BP 110/62 | HR 62 | Ht 64.5 in | Wt 142.8 lb

## 2024-02-04 DIAGNOSIS — R609 Edema, unspecified: Secondary | ICD-10-CM

## 2024-02-04 DIAGNOSIS — E039 Hypothyroidism, unspecified: Secondary | ICD-10-CM | POA: Diagnosis not present

## 2024-02-04 DIAGNOSIS — F419 Anxiety disorder, unspecified: Secondary | ICD-10-CM | POA: Diagnosis not present

## 2024-02-04 DIAGNOSIS — F439 Reaction to severe stress, unspecified: Secondary | ICD-10-CM | POA: Diagnosis not present

## 2024-02-04 DIAGNOSIS — M1380 Other specified arthritis, unspecified site: Secondary | ICD-10-CM

## 2024-02-04 DIAGNOSIS — J069 Acute upper respiratory infection, unspecified: Secondary | ICD-10-CM

## 2024-02-04 NOTE — Patient Instructions (Signed)
 Multiple issues discussed. Wants to contiue on same dose of thyroid  replacement.

## 2024-02-05 ENCOUNTER — Encounter: Payer: Self-pay | Admitting: Internal Medicine

## 2024-02-10 DIAGNOSIS — Z23 Encounter for immunization: Secondary | ICD-10-CM | POA: Diagnosis not present

## 2024-02-22 DIAGNOSIS — M150 Primary generalized (osteo)arthritis: Secondary | ICD-10-CM | POA: Diagnosis not present

## 2024-02-22 DIAGNOSIS — Z1159 Encounter for screening for other viral diseases: Secondary | ICD-10-CM | POA: Diagnosis not present

## 2024-02-22 DIAGNOSIS — M1380 Other specified arthritis, unspecified site: Secondary | ICD-10-CM | POA: Diagnosis not present

## 2024-02-22 DIAGNOSIS — M1129 Other chondrocalcinosis, multiple sites: Secondary | ICD-10-CM | POA: Diagnosis not present

## 2024-02-22 DIAGNOSIS — Z79899 Other long term (current) drug therapy: Secondary | ICD-10-CM | POA: Diagnosis not present

## 2024-02-25 NOTE — Telephone Encounter (Signed)
 done

## 2024-02-26 ENCOUNTER — Ambulatory Visit: Attending: Internal Medicine | Admitting: Physical Therapy

## 2024-02-26 ENCOUNTER — Other Ambulatory Visit: Payer: Self-pay

## 2024-02-26 ENCOUNTER — Encounter: Payer: Self-pay | Admitting: Physical Therapy

## 2024-02-26 DIAGNOSIS — M6281 Muscle weakness (generalized): Secondary | ICD-10-CM

## 2024-02-26 DIAGNOSIS — R262 Difficulty in walking, not elsewhere classified: Secondary | ICD-10-CM | POA: Diagnosis present

## 2024-02-26 NOTE — Therapy (Signed)
 OUTPATIENT PHYSICAL THERAPY LOWER EXTREMITY EVALUATION   Patient Name: [name] MRN: [id number] DOB:Oct 20, 1945, 78 y.o., female Today's Date: 02/26/2024  END OF SESSION:  PT End of Session - 02/26/24 0943     Visit Number 1    Number of Visits 16    Date for Recertification  04/22/24    Authorization Type Medicare    Authorization - Visit Number 1    Progress Note Due on Visit 10    PT Start Time 0845    PT Stop Time 0926    PT Time Calculation (min) 41 min    Activity Tolerance Patient tolerated treatment well    Behavior During Therapy [facility] for tasks assessed/performed          Past Medical History:  Diagnosis Date   Allergic bronchitis    Allergic bronchitis    Allergy     Arthritis    Claustrophobia    Dependent edema    GERD (gastroesophageal reflux disease)    occas problem - burning, cough would take prilosec if needed   H/O hiatal hernia    HSV-2 (herpes simplex virus 2) infection    Hypothyroidism    Peripheral neuropathy    3 toes right foot 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 problem   Psoriatic arthritis (HCC) 02/18/2019   Thyroid  disease    Past Surgical History:  Procedure Laterality Date   BACK SURGERY     BREAST BIOPSY  1972   benign   CARPAL TUNNEL RELEASE Left 12/17/2018   Procedure: LEFT CARPAL TUNNEL RELEASE;  Surgeon: Murrell Drivers, MD;  Location: [facility];  Service: Orthopedics;  Laterality: Left;   CHOLECYSTECTOMY     LUMBAR LAMINECTOMY  1999   L4   LUMBAR LAMINECTOMY  1981   L5   TOTAL HIP ARTHROPLASTY Right 07/02/2012   Procedure: RIGHT TOTAL HIP ARTHROPLASTY ANTERIOR APPROACH;  Surgeon: Lonni CINDERELLA Poli, MD;  Location: [facility];  Service: Orthopedics;  Laterality: Right;   Patient Active Problem List   Diagnosis Date Noted   Psoriatic arthritis (HCC) 03/14/2019   Chronic venous insufficiency 11/25/2018   Dependent edema 09/04/2018   Asymptomatic postmenopausal estrogen deficiency 01/03/2018   History of hip  replacement, total, unspecified laterality 06/22/2017   Bilateral sensorineural hearing loss 01/09/2016   Bilateral tinnitus 01/09/2016   Age-related nuclear cataract of both eyes 09/21/2015   Choroidal nevus, left eye 09/21/2015   Chronic dryness of both eyes 09/21/2015   Dermatochalasis of eyelid 09/21/2015   Presbyopia of both eyes 09/21/2015   PVD (posterior vitreous detachment), both eyes 09/21/2015   Regular astigmatism of both eyes 09/21/2015   Lateral cystocele 07/23/2015   Rectocele 07/23/2015   Osteoarthritis of right hip 05/24/2012   GE reflux 09/23/2011   Hypothyroidism 01/07/2011   Anxiety 01/07/2011   Allergic rhinitis 01/07/2011   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    PCP: Perri Ronal PARAS  REFERRING PROVIDER: Monnie Cedars  REFERRING DIAG: inflammatory arthritis  THERAPY DIAG:  Difficulty in walking, not elsewhere classified  Muscle weakness (generalized)  Rationale for Evaluation and Treatment: Rehabilitation  ONSET DATE: 02/2024 (MD appt)  SUBJECTIVE:   SUBJECTIVE STATEMENT: 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 She states that her inflammatory markers are the best they have been in 4 years. She states her MD recommended that she do PT at this time because she is less stiff and swollen and is now able to exercise.  She states she has tightness in her  hands and is now better able to close them. She reports muscle aches in her legs and occasional pain in her ankles. Mostly she feels squeezing and tightness. She states pain decreases with use of voltaran gel and with changing positions. Pt is usually walks 2 miles every day but she states this is getting more difficult due to muscle aches  PERTINENT HISTORY: Rt THA, lumbar laminectomy, carpal tunnel release, OA, RA PAIN:  Are you having pain? Yes: NPRS scale: 1/10 at rest LEs, hands. Up to 3-4/10 at worst Pain location: LEs, hands Pain description: zing, sore,  tight Aggravating factors: being in one position too long Relieving factors: voltaran  PRECAUTIONS: None  RED FLAGS: None   WEIGHT BEARING RESTRICTIONS: No  FALLS:  Has patient fallen in last 6 months? No    OCCUPATION: retired [employer], walks daily, pickleball  PLOF: Independent  PATIENT GOALS: be able to walk 2 miles and play pickleball without pain  NEXT MD VISIT: 05/2024  OBJECTIVE:  Note: Objective measures were completed at Evaluation unless otherwise noted.  DIAGNOSTIC FINDINGS: none on file  PATIENT SURVEYS:  PSFS: THE PATIENT SPECIFIC FUNCTIONAL SCALE  Place score of 0-10 (0 = unable to perform activity and 10 = able to perform activity at the same level as before injury or problem)  Activity Date: 02/26/24    Walk 2 miles 3    2.     3.     4.      Total Score 3      Total Score = Sum of activity scores/number of activities  Minimally Detectable Change: 3 points (for single activity); 2 points (for average score)  [facility] (nd). The Patient Specific Functional Scale . Retrieved from [email or web address]   COGNITION: Overall cognitive status: Within functional limits for tasks assessed      PALPATION: Circumferential tenderness Rt ankle  LOWER EXTREMITY ROM: Unable to flex Rt 2nd toe, decreased toe flexion Rt foot  Active ROM Right eval Left eval  Hip flexion    Hip extension    Hip abduction    Hip adduction    Hip internal rotation    Hip external rotation    Knee flexion    Knee extension    Ankle dorsiflexion -3 3  Ankle plantarflexion 50 62  Ankle inversion 16 20  Ankle eversion 6 8   (Blank rows = not tested)  LOWER EXTREMITY MMT:  MMT Right eval Left eval  Hip flexion 4+ 4-  Hip extension    Hip abduction    Hip adduction    Hip internal rotation    Hip external rotation    Knee flexion 4 4  Knee extension 4 4  Ankle dorsiflexion 3 4  Ankle  plantarflexion 4- 4-  Ankle inversion 4- 3  Ankle eversion 3 4-   (Blank rows = not tested)    FUNCTIONAL TESTS:  5 times sit to stand: 9.98 seconds SL heel raise = 4 bilat SLS: Lt = 8.68, Rt 4.64  TREATMENT DATE: 02/26/24 See HEP Pt educated on PT POC and goals, HEP, rationale for treatment    PATIENT EDUCATION:  Education details: PT POC and goals, HEP Person educated: Patient Education method: Explanation, Demonstration, and Handouts Education comprehension: verbalized understanding and returned demonstration  HOME EXERCISE PROGRAM: Access Code: [email or web address] URL: [email or web address] Date: 02/26/2024 Prepared by: Darice Conine  Exercises - Gastroc Stretch on Wall  - 1 x daily - 7 x weekly - 1 sets - 3 reps - 20-30 seconds hold - Soleus Stretch on Wall  - 1 x daily - 7 x weekly - 1 sets - 3 reps - 20-30 seconds hold - Towel Scrunches  - 1 x daily - 7 x weekly - 1 sets - 3 reps - 60 seconds hold - Seated Ankle Circles  - 1 x daily - 7 x weekly - 3 sets - 10 reps  ASSESSMENT:  CLINICAL IMPRESSION: Patient is a 78 y.o. female who was seen today for physical therapy evaluation and treatment for inflammatory arthritis. She presents with decreased LE strength, decreased ankle strength and mobility, decreased balance and decreased functional activity tolerance. She will benefit from skilled PT to address deficits and improve functional ROM.   OBJECTIVE IMPAIRMENTS: decreased activity tolerance, difficulty walking, decreased ROM, decreased strength, and pain.   ACTIVITY LIMITATIONS: locomotion level  PARTICIPATION LIMITATIONS: community activity  PERSONAL FACTORS: Time since onset of injury/illness/exacerbation and 1-2 comorbidities: RA, OA are also affecting patient's functional outcome.   REHAB POTENTIAL: Good  CLINICAL DECISION  MAKING: Evolving/moderate complexity  EVALUATION COMPLEXITY: Moderate   GOALS: Goals reviewed with patient? Yes  SHORT TERM GOALS: Target date: 03/25/2024   Pt will be independent in initial HEP Baseline: Goal status: INITIAL  2.  Pt will improve Rt ankle ROM by 5 degrees in all directions to improve walking tolerance Baseline:  Goal status: INITIAL    LONG TERM GOALS: Target date: 04/22/2024    Pt will be independent with advanced HEP Baseline:  Goal status: INITIAL  2.  Pt will improve LE strength to 4+/5 to be able to walk 2 miles with decreased pain Baseline:  Goal status: INITIAL  3.  Pt will demo improved balance by performing SLS >= 10 seconds bilat Baseline:  Goal status: INITIAL  4.  Pt will improve PSFS to >= 6 to demo improved functional mobility Baseline:  Goal status: INITIAL  5.  Pt will demo improved functional strength by improving 5 x STS to <= 8 seconds Baseline:  Goal status: INITIAL   PLAN:  PT FREQUENCY: 2x/week  PT DURATION: 8 weeks  PLANNED INTERVENTIONS: 97164- PT Re-evaluation, 97110-Therapeutic exercises, 97530- Therapeutic activity, 97112- Neuromuscular re-education, 97535- Self Care, 02859- Manual therapy, [id number]- Gait training, [phone]- Aquatic Therapy, [phone]- Electrical stimulation (unattended), 97016- Vasopneumatic device, [id number]- Ionotophoresis 4mg /ml Dexamethasone , 79439 (1-2 muscles), 20561 (3+ muscles)- Dry Needling, Patient/Family education, Balance training, Taping, Cryotherapy, and Moist heat  PLAN FOR NEXT SESSION: ankle strength and mobility, balance   Issa Luster, PT 02/26/2024, 9:44 AM

## 2024-03-01 ENCOUNTER — Ambulatory Visit

## 2024-03-01 DIAGNOSIS — M6281 Muscle weakness (generalized): Secondary | ICD-10-CM

## 2024-03-01 DIAGNOSIS — R262 Difficulty in walking, not elsewhere classified: Secondary | ICD-10-CM | POA: Diagnosis not present

## 2024-03-01 NOTE — Therapy (Addendum)
 OUTPATIENT PHYSICAL THERAPY LOWER EXTREMITY TREATMENT   Patient Name: [name] MRN: [id number] DOB:11-02-1945, 78 y.o., female Today's Date: 03/01/2024  END OF SESSION:  PT End of Session - 03/01/24 1012     Visit Number 2    Number of Visits 16    Date for Recertification  04/22/24    Authorization Type Medicare    Progress Note Due on Visit 10    PT Start Time 1015    PT Stop Time 1111    PT Time Calculation (min) 56 min    Activity Tolerance Patient tolerated treatment well    Behavior During Therapy WFL for tasks assessed/performed         Past Medical History:  Diagnosis Date   Allergic bronchitis    Allergic bronchitis    Allergy     Arthritis    Claustrophobia    Dependent edema    GERD (gastroesophageal reflux disease)    occas problem - burning, cough would take prilosec if needed   H/O hiatal hernia    HSV-2 (herpes simplex virus 2) infection    Hypothyroidism    Peripheral neuropathy    3 toes right foot 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 problem   Psoriatic arthritis (HCC) 02/18/2019   Thyroid  disease    Past Surgical History:  Procedure Laterality Date   BACK SURGERY     BREAST BIOPSY  1972   benign   CARPAL TUNNEL RELEASE Left 12/17/2018   Procedure: LEFT CARPAL TUNNEL RELEASE;  Surgeon: Murrell Drivers, MD;  Location: [facility];  Service: Orthopedics;  Laterality: Left;   CHOLECYSTECTOMY     LUMBAR LAMINECTOMY  1999   L4   LUMBAR LAMINECTOMY  1981   L5   TOTAL HIP ARTHROPLASTY Right 07/02/2012   Procedure: RIGHT TOTAL HIP ARTHROPLASTY ANTERIOR APPROACH;  Surgeon: Lonni CINDERELLA Poli, MD;  Location: [facility];  Service: Orthopedics;  Laterality: Right;   Patient Active Problem List   Diagnosis Date Noted   Psoriatic arthritis (HCC) 03/14/2019   Chronic venous insufficiency 11/25/2018   Dependent edema 09/04/2018   Asymptomatic postmenopausal estrogen deficiency 01/03/2018   History of hip replacement, total, unspecified  laterality 06/22/2017   Bilateral sensorineural hearing loss 01/09/2016   Bilateral tinnitus 01/09/2016   Age-related nuclear cataract of both eyes 09/21/2015   Choroidal nevus, left eye 09/21/2015   Chronic dryness of both eyes 09/21/2015   Dermatochalasis of eyelid 09/21/2015   Presbyopia of both eyes 09/21/2015   PVD (posterior vitreous detachment), both eyes 09/21/2015   Regular astigmatism of both eyes 09/21/2015   Lateral cystocele 07/23/2015   Rectocele 07/23/2015   Osteoarthritis of right hip 05/24/2012   GE reflux 09/23/2011   Hypothyroidism 01/07/2011   Anxiety 01/07/2011   Allergic rhinitis 01/07/2011   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 She states that her inflammatory markers are the best they have been in 4 years. She states her MD recommended that she do PT at  this time because she is less stiff and swollen and is now able to exercise.  She states she has tightness in her hands and is now better able to close them. She reports muscle aches in her legs and occasional pain in her ankles. Mostly she feels squeezing and tightness. She states pain decreases with use of voltaran gel and with changing positions. Pt is usually walks 2 miles every day but she states this is getting more difficult due to muscle aches  PERTINENT HISTORY: Rt THA, lumbar laminectomy, carpal tunnel release, OA, RA PAIN:  Are you  having pain? Yes: NPRS scale: 2/10 sitting, 4/10 walking Pain location: LEs, hands Pain description: zing, sore, tight Aggravating factors: being in one position too long Relieving factors: voltaran  PRECAUTIONS: None  RED FLAGS: None   WEIGHT BEARING RESTRICTIONS: No  FALLS:  Has patient fallen in last 6 months? No   OCCUPATION: retired [employer], walks daily, pickleball  PLOF: Independent  PATIENT GOALS: be able to walk 2 miles and play pickleball without pain  NEXT MD VISIT: 05/2024  OBJECTIVE:  Note: Objective measures were completed at Evaluation unless otherwise noted.  DIAGNOSTIC FINDINGS: none on file  PATIENT SURVEYS:  PSFS: THE PATIENT SPECIFIC FUNCTIONAL SCALE  Place score of 0-10 (0 = unable to perform activity and 10 = able to perform activity at the same level as before injury or problem)  Activity Date: 02/26/24    Walk 2 miles 3    2.     3.     4.      Total Score 3      Total Score = Sum of activity scores/number of activities  Minimally Detectable Change: 3 points (for single activity); 2 points (for average score)  [facility] (nd). The Patient Specific Functional Scale . Retrieved from [email or web address]   COGNITION: Overall cognitive status: Within functional limits for tasks assessed      PALPATION: Circumferential tenderness Rt ankle  LOWER EXTREMITY ROM: Unable to flex Rt 2nd toe, decreased toe flexion Rt foot  Active ROM Right eval Left eval  Hip flexion    Hip extension    Hip abduction    Hip adduction    Hip internal rotation    Hip external rotation    Knee flexion    Knee extension    Ankle dorsiflexion -3 3  Ankle plantarflexion 50 62  Ankle inversion 16 20  Ankle eversion 6 8   (Blank rows = not tested)  LOWER EXTREMITY MMT:  MMT Right eval Left eval  Hip flexion 4+ 4-  Hip extension    Hip abduction    Hip adduction    Hip internal  rotation    Hip external rotation    Knee flexion 4 4  Knee extension 4 4  Ankle dorsiflexion 3 4  Ankle plantarflexion 4- 4-  Ankle inversion 4- 3  Ankle eversion 3 4-   (Blank rows = not tested)    FUNCTIONAL TESTS:  5 times sit to stand: 9.98 seconds SL heel raise = 4 bilat SLS: Lt = 8.68, Rt 4.64   [facility] Adult PT Treatment:                                                DATE: 03/01/2024 Therapeutic Exercise: Standing gastroc & soleus stretches Long sitting gastroc &  soleus stretches with strap Self-massage with orange & yellow spiky balls --> piriformis/glutes, hamstrings (Rt) Neuromuscular re-ed: Supine: Hip add isometric ball squeeze 10 x 5 sec Hip abd blue TB press 10 x 5 sec Side Lying: Clamshells 2x10 Leg over & back x10 Forward weight shifting with Rt toes raised on towel roll Great toe raises on towel roll --> in standing & with gastroc stretch Therapeutic Activity: Standing heel raises + tennis ball b/w ankles x 20 Standing toe raises 10 x 3 sec Ankle PF + green TB x 15 Ankle DF + red TB x 15 Ankle ever/inv --> added yellow TB x15 each Floor to standing transfer review Self Care: Soft spiky massage balls for feet/glutes/hamstring                                                                                                                                TREATMENT DATE: 02/26/24 See HEP Pt educated on PT POC and goals, HEP, rationale for treatment    PATIENT EDUCATION:  Education details: Updated HEP Person educated: Patient Education method: Explanation, Demonstration, and Handouts Education comprehension: verbalized understanding and returned demonstration  HOME EXERCISE PROGRAM: Access Code: [email or web address] URL: [email or web address] Date: 03/01/2024 Prepared by: Lamarr Price  Exercises - Gastroc Stretch on Wall  - 1 x daily - 7 x weekly - 1 sets - 3 reps - 20-30 seconds hold - Soleus Stretch on Wall  - 1 x daily - 7 x weekly - 1  sets - 3 reps - 20-30 seconds hold - Towel Scrunches  - 1 x daily - 7 x weekly - 1 sets - 3 reps - 60 seconds hold - Seated Ankle Circles  - 1 x daily - 7 x weekly - 3 sets - 10 reps - Long Sitting Eccentric Ankle Plantar Flexion with Resistance  - 1 x daily - 7 x weekly - 3 sets - 10 reps - Long Sitting Ankle Dorsiflexion with Anchored Resistance  - 1 x daily - 7 x weekly - 1 sets - 10 reps - Ankle Eversion with Resistance  - 1 x daily - 7 x weekly - 3 sets - 10 reps - Ankle Inversion with Resistance  - 1 x daily - 7 x weekly - 3 sets - 10 reps - Long Sitting Calf Stretch with Strap  - 1 x daily - 7 x weekly - 3 sets - 10 reps - Long Sitting Soleus Stretch on Bolster with Strap  - 1 x daily - 7 x weekly - 1 sets - 3-5 reps - 10-30 sec hold - Clamshell at Wall with Resistance  - 1 x daily - 7 x weekly - 1-3 sets - 10 reps - 3 sec hold - Sidelying Over and Back  - 1 x daily - 7 x weekly - 1-3 sets - 10 reps - Seated Self Great Toe Stretch  - 1 x daily - 7 x weekly - 1 sets - 5  reps - 10 sec hold  ASSESSMENT:  CLINICAL IMPRESSION: Reviewed HEP and progressed as tolerated; resistance added with ankle strengthening exercises - patient challenged with isometric hold during standing ankle dorsiflexion (toe raises). Weakness noted with right gluteal strength, most notably external rotators with side lying hip exercises. Reviewed floor to standing transfers; added great toe extension stretch variations due to complaints of restricted toe extension limiting floor transfer mobility.   EVAL: Patient is a 78 y.o. female who was seen today for physical therapy evaluation and treatment for inflammatory arthritis. She presents with decreased LE strength, decreased ankle strength and mobility, decreased balance and decreased functional activity tolerance. She will benefit from skilled PT to address deficits and improve functional ROM.   OBJECTIVE IMPAIRMENTS: decreased activity tolerance, difficulty walking,  decreased ROM, decreased strength, and pain.   ACTIVITY LIMITATIONS: locomotion level  PARTICIPATION LIMITATIONS: community activity  PERSONAL FACTORS: Time since onset of injury/illness/exacerbation and 1-2 comorbidities: RA, OA are also affecting patient's functional outcome.   REHAB POTENTIAL: Good  CLINICAL DECISION MAKING: Evolving/moderate complexity  EVALUATION COMPLEXITY: Moderate   GOALS: Goals reviewed with patient? Yes  SHORT TERM GOALS: Target date: 03/25/2024  Pt will be independent in initial HEP Baseline: Goal status: INITIAL  2.  Pt will improve Rt ankle ROM by 5 degrees in all directions to improve walking tolerance Baseline:  Goal status: INITIAL   LONG TERM GOALS: Target date: 04/22/2024  Pt will be independent with advanced HEP Baseline:  Goal status: INITIAL  2.  Pt will improve LE strength to 4+/5 to be able to walk 2 miles with decreased pain Baseline:  Goal status: INITIAL  3.  Pt will demo improved balance by performing SLS >= 10 seconds bilat Baseline:  Goal status: INITIAL  4.  Pt will improve PSFS to >= 6 to demo improved functional mobility Baseline:  Goal status: INITIAL  5.  Pt will demo improved functional strength by improving 5 x STS to <= 8 seconds Baseline:  Goal status: INITIAL   PLAN:  PT FREQUENCY: 2x/week  PT DURATION: 8 weeks  PLANNED INTERVENTIONS: 97164- PT Re-evaluation, 97110-Therapeutic exercises, 97530- Therapeutic activity, 97112- Neuromuscular re-education, 97535- Self Care, 02859- Manual therapy, [id number]- Gait training, [phone]- Aquatic Therapy, [phone]- Electrical stimulation (unattended), 97016- Vasopneumatic device, [id number]- Ionotophoresis 4mg /ml Dexamethasone , 79439 (1-2 muscles), 20561 (3+ muscles)- Dry Needling, Patient/Family education, Balance training, Taping, Cryotherapy, and Moist heat  PLAN FOR NEXT SESSION: Progress ankle & glute strength and mobility, static/dynamic balance, great toe extension/mobility,  core strength. Update HEP as needed.   Lamarr GORMAN Price, PTA 03/01/2024, 11:17 AM

## 2024-03-04 ENCOUNTER — Ambulatory Visit: Admitting: Physical Therapy

## 2024-03-04 ENCOUNTER — Encounter: Payer: Self-pay | Admitting: Physical Therapy

## 2024-03-04 DIAGNOSIS — M6281 Muscle weakness (generalized): Secondary | ICD-10-CM

## 2024-03-04 DIAGNOSIS — R262 Difficulty in walking, not elsewhere classified: Secondary | ICD-10-CM

## 2024-03-04 NOTE — Therapy (Signed)
 " OUTPATIENT PHYSICAL THERAPY LOWER EXTREMITY TREATMENT   Patient Name: [name] MRN: [id number] DOB:1945-10-11, 78 y.o., female Today's Date: 03/04/2024  END OF SESSION:  PT End of Session - 03/04/24 0841     Visit Number 3    Number of Visits 16    Date for Recertification  04/22/24    Authorization Type Medicare    Authorization - Visit Number 2    Progress Note Due on Visit 10    PT Start Time 0803    PT Stop Time 0842    PT Time Calculation (min) 39 min    Activity Tolerance Patient tolerated treatment well    Behavior During Therapy [facility] for tasks assessed/performed          Past Medical History:  Diagnosis Date   Allergic bronchitis    Allergic bronchitis    Allergy     Arthritis    Claustrophobia    Dependent edema    GERD (gastroesophageal reflux disease)    occas problem - burning, cough would take prilosec if needed   H/O hiatal hernia    HSV-2 (herpes simplex virus 2) infection    Hypothyroidism    Peripheral neuropathy    3 toes right foot 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 problem   Psoriatic arthritis (HCC) 02/18/2019   Thyroid  disease    Past Surgical History:  Procedure Laterality Date   BACK SURGERY     BREAST BIOPSY  1972   benign   CARPAL TUNNEL RELEASE Left 12/17/2018   Procedure: LEFT CARPAL TUNNEL RELEASE;  Surgeon: Murrell Drivers, MD;  Location: [facility];  Service: Orthopedics;  Laterality: Left;   CHOLECYSTECTOMY     LUMBAR LAMINECTOMY  1999   L4   LUMBAR LAMINECTOMY  1981   L5   TOTAL HIP ARTHROPLASTY Right 07/02/2012   Procedure: RIGHT TOTAL HIP ARTHROPLASTY ANTERIOR APPROACH;  Surgeon: Lonni CINDERELLA Poli, MD;  Location: [facility];  Service: Orthopedics;  Laterality: Right;   Patient Active Problem List   Diagnosis Date Noted   Psoriatic arthritis (HCC) 03/14/2019   Chronic venous insufficiency 11/25/2018   Dependent edema 09/04/2018   Asymptomatic postmenopausal estrogen deficiency 01/03/2018   History of hip  replacement, total, unspecified laterality 06/22/2017   Bilateral sensorineural hearing loss 01/09/2016   Bilateral tinnitus 01/09/2016   Age-related nuclear cataract of both eyes 09/21/2015   Choroidal nevus, left eye 09/21/2015   Chronic dryness of both eyes 09/21/2015   Dermatochalasis of eyelid 09/21/2015   Presbyopia of both eyes 09/21/2015   PVD (posterior vitreous detachment), both eyes 09/21/2015   Regular astigmatism of both eyes 09/21/2015   Lateral cystocele 07/23/2015   Rectocele 07/23/2015   Osteoarthritis of right hip 05/24/2012   GE reflux 09/23/2011   Hypothyroidism 01/07/2011   Anxiety 01/07/2011   Allergic rhinitis 01/07/2011   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 She does not think the exercises have effected the muscle aches. 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 She states that her inflammatory markers are the best they have been in 4 years.  She states her MD recommended that she do PT at this time because she is less stiff and swollen and is now able to exercise.  She states she has tightness in her hands and is now better able to close them. She reports muscle aches in her legs and occasional pain in her ankles. Mostly she feels squeezing and tightness. She states pain decreases with use of voltaran gel and with changing positions. Pt is usually walks 2 miles every day but she states this is getting more difficult due to muscle aches  PERTINENT HISTORY: Rt THA, lumbar laminectomy, carpal  tunnel release, OA, RA PAIN:  Are you having pain? Yes: NPRS scale: 2/10 sitting, 4/10 walking Pain location: LEs, hands Pain description: zing, sore, tight Aggravating factors: being in one position too long Relieving factors: voltaran  PRECAUTIONS: None  RED FLAGS: None   WEIGHT BEARING RESTRICTIONS: No  FALLS:  Has patient fallen in last 6 months? No   OCCUPATION: retired [employer], walks daily, pickleball  PLOF: Independent  PATIENT GOALS: be able to walk 2 miles and play pickleball without pain  NEXT MD VISIT: 05/2024  OBJECTIVE:  Note: Objective measures were completed at Evaluation unless otherwise noted.  DIAGNOSTIC FINDINGS: none on file  PATIENT SURVEYS:  PSFS: THE PATIENT SPECIFIC FUNCTIONAL SCALE  Place score of 0-10 (0 = unable to perform activity and 10 = able to perform activity at the same level as before injury or problem)  Activity Date: 02/26/24    Walk 2 miles 3    2.     3.     4.      Total Score 3      Total Score = Sum of activity scores/number of activities  Minimally Detectable Change: 3 points (for single activity); 2 points (for average score)  [facility] (nd). The Patient Specific Functional Scale . Retrieved from [email or web address]   COGNITION: Overall cognitive status: Within functional limits for tasks assessed      PALPATION: Circumferential tenderness Rt ankle  LOWER EXTREMITY ROM: Unable to flex Rt 2nd toe, decreased toe flexion Rt foot  Active ROM Right eval Left eval  Hip flexion    Hip extension    Hip abduction    Hip adduction    Hip internal rotation    Hip external rotation    Knee flexion    Knee extension    Ankle dorsiflexion -3 3  Ankle plantarflexion 50 62  Ankle inversion 16 20  Ankle eversion 6 8   (Blank rows = not tested)  LOWER EXTREMITY MMT:  MMT Right eval Left eval  Hip flexion 4+ 4-  Hip extension    Hip abduction     Hip adduction    Hip internal rotation    Hip external rotation    Knee flexion 4 4  Knee extension 4 4  Ankle dorsiflexion 3 4  Ankle plantarflexion 4- 4-  Ankle inversion 4- 3  Ankle eversion 3 4-   (Blank rows = not tested)    FUNCTIONAL TESTS:  5 times sit to stand: 9.98 seconds SL heel raise = 4 bilat SLS: Lt = 8.68, Rt 4.64   [facility] Adult PT Treatment:                                                DATE: 03/04/24 Therapeutic  Exercise: Standing gastroc & soleus stretches  Neuromuscular re-ed: Standing Rocker board PF/DF x 1 min, laterally x 1 min with intermittent UE support Forward weight shifting with Rt toes raised on towel roll Great toe raises on towel roll --> in standing & with gastroc stretch Sidelying Clam shell 2 x 10 Hip Over and back x 10 Therapeutic Activity: Standing heel raises + tennis ball b/w ankles x 20 Standing toe raises 10 x 3 sec Ankle inv/ev yellow TB x 20 bilat - assistance to prevent compensation with hips   [facility] Adult PT Treatment:                                                DATE: 03/01/2024 Therapeutic Exercise: Standing gastroc & soleus stretches Long sitting gastroc & soleus stretches with strap Self-massage with orange & yellow spiky balls --> piriformis/glutes, hamstrings (Rt) Neuromuscular re-ed: Supine: Hip add isometric ball squeeze 10 x 5 sec Hip abd blue TB press 10 x 5 sec Side Lying: Clamshells 2x10 Leg over & back x10 Forward weight shifting with Rt toes raised on towel roll Great toe raises on towel roll --> in standing & with gastroc stretch Therapeutic Activity: Standing heel raises + tennis ball b/w ankles x 20 Standing toe raises 10 x 3 sec Ankle PF + green TB x 15 Ankle DF + red TB x 15 Ankle ever/inv --> added yellow TB x15 each Floor to standing transfer review Self Care: Soft spiky massage balls for feet/glutes/hamstring     PATIENT EDUCATION:  Education details: Updated HEP Person educated:  Patient Education method: Explanation, Demonstration, and Handouts Education comprehension: verbalized understanding and returned demonstration  HOME EXERCISE PROGRAM: Access Code: [email or web address] URL: [email or web address] Date: 03/01/2024 Prepared by: Lamarr Price  Exercises - Gastroc Stretch on Wall  - 1 x daily - 7 x weekly - 1 sets - 3 reps - 20-30 seconds hold - Soleus Stretch on Wall  - 1 x daily - 7 x weekly - 1 sets - 3 reps - 20-30 seconds hold - Towel Scrunches  - 1 x daily - 7 x weekly - 1 sets - 3 reps - 60 seconds hold - Seated Ankle Circles  - 1 x daily - 7 x weekly - 3 sets - 10 reps - Long Sitting Eccentric Ankle Plantar Flexion with Resistance  - 1 x daily - 7 x weekly - 3 sets - 10 reps - Long Sitting Ankle Dorsiflexion with Anchored Resistance  - 1 x daily - 7 x weekly - 1 sets - 10 reps - Ankle Eversion with Resistance  - 1 x daily - 7 x weekly - 3 sets - 10 reps - Ankle Inversion with Resistance  - 1 x daily - 7 x weekly - 3 sets - 10 reps - Long Sitting Calf Stretch with Strap  - 1 x daily - 7 x weekly - 3 sets - 10 reps - Long Sitting Soleus Stretch on Bolster with Strap  - 1 x daily - 7 x weekly - 1 sets - 3-5 reps - 10-30 sec hold - Clamshell at Wall with Resistance  - 1 x daily - 7 x weekly - 1-3 sets - 10 reps - 3 sec hold - Sidelying Over and Back  - 1 x daily - 7 x weekly - 1-3 sets - 10 reps - Seated Self Great  Toe Stretch  - 1 x daily - 7 x weekly - 1 sets - 5 reps - 10 sec hold  ASSESSMENT:  CLINICAL IMPRESSION: Pt continues with ankle tightness and weakness, requiring cues to reduce compensation during ankle ev/inv exercises. Good tolerance to addition of rocker board today. She continues to be challenged by hip strengthening and PT encouraged her to continue these exercises at home  EVAL: Patient is a 78 y.o. female who was seen today for physical therapy evaluation and treatment for inflammatory arthritis. She presents with decreased LE  strength, decreased ankle strength and mobility, decreased balance and decreased functional activity tolerance. She will benefit from skilled PT to address deficits and improve functional ROM.   OBJECTIVE IMPAIRMENTS: decreased activity tolerance, difficulty walking, decreased ROM, decreased strength, and pain.     GOALS: Goals reviewed with patient? Yes  SHORT TERM GOALS: Target date: 03/25/2024  Pt will be independent in initial HEP Baseline: Goal status: INITIAL  2.  Pt will improve Rt ankle ROM by 5 degrees in all directions to improve walking tolerance Baseline:  Goal status: INITIAL   LONG TERM GOALS: Target date: 04/22/2024  Pt will be independent with advanced HEP Baseline:  Goal status: INITIAL  2.  Pt will improve LE strength to 4+/5 to be able to walk 2 miles with decreased pain Baseline:  Goal status: INITIAL  3.  Pt will demo improved balance by performing SLS >= 10 seconds bilat Baseline:  Goal status: INITIAL  4.  Pt will improve PSFS to >= 6 to demo improved functional mobility Baseline:  Goal status: INITIAL  5.  Pt will demo improved functional strength by improving 5 x STS to <= 8 seconds Baseline:  Goal status: INITIAL   PLAN:  PT FREQUENCY: 2x/week  PT DURATION: 8 weeks  PLANNED INTERVENTIONS: 97164- PT Re-evaluation, 97110-Therapeutic exercises, 97530- Therapeutic activity, 97112- Neuromuscular re-education, 97535- Self Care, 02859- Manual therapy, [id number]- Gait training, [phone]- Aquatic Therapy, [phone]- Electrical stimulation (unattended), 97016- Vasopneumatic device, [id number]- Ionotophoresis 4mg /ml Dexamethasone , 79439 (1-2 muscles), 20561 (3+ muscles)- Dry Needling, Patient/Family education, Balance training, Taping, Cryotherapy, and Moist heat  PLAN FOR NEXT SESSION: Progress ankle & glute strength and mobility, static/dynamic balance, great toe extension/mobility, core strength. Update HEP as needed.   Latoria Dry, PT 03/04/2024, 8:42 AM  "

## 2024-03-07 ENCOUNTER — Ambulatory Visit

## 2024-03-09 ENCOUNTER — Ambulatory Visit

## 2024-03-15 ENCOUNTER — Other Ambulatory Visit: Payer: Self-pay | Admitting: Internal Medicine

## 2024-03-15 ENCOUNTER — Ambulatory Visit

## 2024-03-18 ENCOUNTER — Ambulatory Visit: Attending: Internal Medicine | Admitting: Physical Therapy

## 2024-03-18 ENCOUNTER — Encounter: Payer: Self-pay | Admitting: Physical Therapy

## 2024-03-18 DIAGNOSIS — R262 Difficulty in walking, not elsewhere classified: Secondary | ICD-10-CM | POA: Insufficient documentation

## 2024-03-18 DIAGNOSIS — M6281 Muscle weakness (generalized): Secondary | ICD-10-CM | POA: Insufficient documentation

## 2024-03-18 NOTE — Therapy (Signed)
 " OUTPATIENT PHYSICAL THERAPY LOWER EXTREMITY TREATMENT   Patient Name: [name] MRN: [id number] DOB:[birth year], 79 y.o., female Today's Date: 03/18/2024  END OF SESSION:  PT End of Session - 03/18/24 0845     Visit Number 4    Number of Visits 16    Date for Recertification  04/22/24    Authorization Type Medicare    Authorization - Visit Number 3    Progress Note Due on Visit 10    PT Start Time 0802    PT Stop Time 0843    PT Time Calculation (min) 41 min    Activity Tolerance Patient tolerated treatment well    Behavior During Therapy [facility] for tasks assessed/performed           Past Medical History:  Diagnosis Date   Allergic bronchitis    Allergic bronchitis    Allergy     Arthritis    Claustrophobia    Dependent edema    GERD (gastroesophageal reflux disease)    occas problem - burning, cough would take prilosec if needed   H/O hiatal hernia    HSV-2 (herpes simplex virus 2) infection    Hypothyroidism    Peripheral neuropathy    3 toes right foot 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 problem   Psoriatic arthritis (HCC) 02/18/2019   Thyroid  disease    Past Surgical History:  Procedure Laterality Date   BACK SURGERY     BREAST BIOPSY  1972   benign   CARPAL TUNNEL RELEASE Left 12/17/2018   Procedure: LEFT CARPAL TUNNEL RELEASE;  Surgeon: Murrell Drivers, MD;  Location: [facility];  Service: Orthopedics;  Laterality: Left;   CHOLECYSTECTOMY     LUMBAR LAMINECTOMY  1999   L4   LUMBAR LAMINECTOMY  1981   L5   TOTAL HIP ARTHROPLASTY Right 07/02/2012   Procedure: RIGHT TOTAL HIP ARTHROPLASTY ANTERIOR APPROACH;  Surgeon: Lonni CINDERELLA Poli, MD;  Location: [facility];  Service: Orthopedics;  Laterality: Right;   Patient Active Problem List   Diagnosis Date Noted   Psoriatic arthritis (HCC) 03/14/2019   Chronic venous insufficiency 11/25/2018   Dependent edema 09/04/2018   Asymptomatic postmenopausal estrogen deficiency 01/03/2018   History of hip  replacement, total, unspecified laterality 06/22/2017   Bilateral sensorineural hearing loss 01/09/2016   Bilateral tinnitus 01/09/2016   Age-related nuclear cataract of both eyes 09/21/2015   Choroidal nevus, left eye 09/21/2015   Chronic dryness of both eyes 09/21/2015   Dermatochalasis of eyelid 09/21/2015   Presbyopia of both eyes 09/21/2015   PVD (posterior vitreous detachment), both eyes 09/21/2015   Regular astigmatism of both eyes 09/21/2015   Lateral cystocele 07/23/2015   Rectocele 07/23/2015   Osteoarthritis of right hip 05/24/2012   GE reflux 09/23/2011   Hypothyroidism 01/07/2011   Anxiety 01/07/2011   Allergic rhinitis 01/07/2011   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 She does get a calf ache especially with the first step. 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 She states that her inflammatory markers are the best they have been in 4 years.  She states her MD recommended that she do PT at this time because she is less stiff and swollen and is now able to exercise.  She states she has tightness in her hands and is now better able to close them. She reports muscle aches in her legs and occasional pain in her ankles. Mostly she feels squeezing and tightness. She states pain decreases with use of voltaran gel and with changing positions. Pt is usually walks 2 miles every day but she states this is getting more difficult due to muscle aches  PERTINENT HISTORY: Rt THA, lumbar laminectomy, carpal tunnel  release, OA, RA PAIN:  Are you having pain? Yes: NPRS scale: 2/10 sitting, 4/10 walking Pain location: LEs, hands Pain description: zing, sore, tight Aggravating factors: being in one position too long Relieving factors: voltaran  PRECAUTIONS: None  RED FLAGS: None   WEIGHT BEARING RESTRICTIONS: No  FALLS:  Has patient fallen in last 6 months? No   OCCUPATION: retired [employer], walks daily, pickleball  PLOF: Independent  PATIENT GOALS: be able to walk 2 miles and play pickleball without pain  NEXT MD VISIT: 05/2024  OBJECTIVE:  Note: Objective measures were completed at Evaluation unless otherwise noted.  DIAGNOSTIC FINDINGS: none on file  PATIENT SURVEYS:  PSFS: THE PATIENT SPECIFIC FUNCTIONAL SCALE  Place score of 0-10 (0 = unable to perform activity and 10 = able to perform activity at the same level as before injury or problem)  Activity Date: 02/26/24    Walk 2 miles 3    2.     3.     4.      Total Score 3      Total Score = Sum of activity scores/number of activities  Minimally Detectable Change: 3 points (for single activity); 2 points (for average score)  [facility] (nd). The Patient Specific Functional Scale . Retrieved from [email or web address]   COGNITION: Overall cognitive status: Within functional limits for tasks assessed      PALPATION: Circumferential tenderness Rt ankle  LOWER EXTREMITY ROM: Unable to flex Rt 2nd toe, decreased toe flexion Rt foot  Active ROM Right eval Left eval  Hip flexion    Hip extension    Hip abduction    Hip adduction    Hip internal rotation    Hip external rotation    Knee flexion    Knee extension    Ankle dorsiflexion -3 3  Ankle plantarflexion 50 62  Ankle inversion 16 20  Ankle eversion 6 8   (Blank rows = not tested)  LOWER EXTREMITY MMT:  MMT Right eval Left eval  Hip flexion 4+ 4-  Hip extension    Hip abduction    Hip  adduction    Hip internal rotation    Hip external rotation    Knee flexion 4 4  Knee extension 4 4  Ankle dorsiflexion 3 4  Ankle plantarflexion 4- 4-  Ankle inversion 4- 3  Ankle eversion 3 4-   (Blank rows = not tested)    FUNCTIONAL TESTS:  5 times sit to stand: 9.98 seconds SL heel raise = 4 bilat SLS: Lt = 8.68, Rt 4.64   [facility] Adult PT Treatment:                                                DATE: 03/18/2024 Therapeutic  Exercise: Standing gastroc and soleus stretches 2 x 30 sec Peroneal nerve stretch supine with strap  Neuromuscular re-ed: Heel walking/toe walking fwd/bkwd Side step yellow TB around feet Sidelying Clam shell red TB 2 x 10 Hip over and back 2 x 10 Standing blue TB pull down with slow march for balance and core strength Therapeutic Activity: Side step on airex beam Tandem walking on airex beam   [facility] Adult PT Treatment:                                                DATE: 03/04/24 Therapeutic Exercise: Standing gastroc & soleus stretches  Neuromuscular re-ed: Standing Rocker board PF/DF x 1 min, laterally x 1 min with intermittent UE support Forward weight shifting with Rt toes raised on towel roll Great toe raises on towel roll --> in standing & with gastroc stretch Sidelying Clam shell 2 x 10 Hip Over and back x 10 Therapeutic Activity: Standing heel raises + tennis ball b/w ankles x 20 Standing toe raises 10 x 3 sec Ankle inv/ev yellow TB x 20 bilat - assistance to prevent compensation with hips     PATIENT EDUCATION:  Education details: Updated HEP Person educated: Patient Education method: Explanation, Demonstration, and Handouts Education comprehension: verbalized understanding and returned demonstration  HOME EXERCISE PROGRAM: Access Code: [email or web address] URL: [email or web address] Date: 03/18/2024 Prepared by: Darice Conine  Exercises - Gastroc Stretch on Wall  - 1 x daily - 7 x weekly - 1 sets - 3 reps - 20-30  seconds hold - Soleus Stretch on Wall  - 1 x daily - 7 x weekly - 1 sets - 3 reps - 20-30 seconds hold - Ankle Eversion with Resistance  - 1 x daily - 7 x weekly - 3 sets - 10 reps - Ankle Inversion with Resistance  - 1 x daily - 7 x weekly - 3 sets - 10 reps - Clamshell at Wall with Resistance  - 1 x daily - 7 x weekly - 1-3 sets - 10 reps - 3 sec hold - Sidelying Over and Back  - 1 x daily - 7 x weekly - 1-3 sets - 10 reps - Seated Self Great Toe Stretch  - 1 x daily - 7 x weekly - 1 sets - 5 reps - 10 sec hold - Supine Peroneal Nerve Glide  - 1 x daily - 7 x weekly - 1 sets - 10 reps  ASSESSMENT:  CLINICAL IMPRESSION: Pt with some symptoms in peroneal nerve distribution, good results with peroneal nerve stretch- added to HEP. Continued to progress ankle strength and balance with good tolerance throughout session  EVAL: Patient is a 79 y.o. female who was seen today for physical therapy evaluation and treatment for inflammatory arthritis. She presents with decreased LE strength, decreased ankle strength and mobility, decreased balance and decreased functional activity tolerance. She will benefit from skilled PT to address deficits and improve functional ROM.   OBJECTIVE IMPAIRMENTS: decreased activity tolerance, difficulty walking, decreased ROM, decreased strength, and pain.     GOALS: Goals reviewed with patient? Yes  SHORT TERM GOALS: Target date: 03/25/2024  Pt will be independent in initial HEP Baseline: Goal status: INITIAL  2.  Pt will improve Rt ankle ROM by 5 degrees in all directions to improve walking tolerance Baseline:  Goal status: INITIAL   LONG TERM GOALS: Target  date: 04/22/2024  Pt will be independent with advanced HEP Baseline:  Goal status: INITIAL  2.  Pt will improve LE strength to 4+/5 to be able to walk 2 miles with decreased pain Baseline:  Goal status: INITIAL  3.  Pt will demo improved balance by performing SLS >= 10 seconds bilat Baseline:  Goal  status: INITIAL  4.  Pt will improve PSFS to >= 6 to demo improved functional mobility Baseline:  Goal status: INITIAL  5.  Pt will demo improved functional strength by improving 5 x STS to <= 8 seconds Baseline:  Goal status: INITIAL   PLAN:  PT FREQUENCY: 2x/week  PT DURATION: 8 weeks  PLANNED INTERVENTIONS: 97164- PT Re-evaluation, 97110-Therapeutic exercises, 97530- Therapeutic activity, 97112- Neuromuscular re-education, 97535- Self Care, 02859- Manual therapy, [id number]- Gait training, [phone]- Aquatic Therapy, [phone]- Electrical stimulation (unattended), 97016- Vasopneumatic device, [id number]- Ionotophoresis 4mg /ml Dexamethasone , 79439 (1-2 muscles), 20561 (3+ muscles)- Dry Needling, Patient/Family education, Balance training, Taping, Cryotherapy, and Moist heat  PLAN FOR NEXT SESSION: Progress ankle & glute strength and mobility, static/dynamic balance, great toe extension/mobility, core strength. Update HEP as needed.   Aishani Kalis, PT 03/18/2024, 8:45 AM  "

## 2024-03-23 ENCOUNTER — Encounter: Payer: Self-pay | Admitting: Physical Therapy

## 2024-03-23 ENCOUNTER — Ambulatory Visit: Admitting: Physical Therapy

## 2024-03-23 DIAGNOSIS — M6281 Muscle weakness (generalized): Secondary | ICD-10-CM

## 2024-03-23 DIAGNOSIS — R262 Difficulty in walking, not elsewhere classified: Secondary | ICD-10-CM

## 2024-03-23 NOTE — Therapy (Signed)
 " OUTPATIENT PHYSICAL THERAPY LOWER EXTREMITY TREATMENT   Patient Name: [name] MRN: [id number] DOB:March 29, 1945, 79 y.o., female Today's Date: 03/23/2024  END OF SESSION:  PT End of Session - 03/23/24 1059     Visit Number 5    Number of Visits 16    Date for Recertification  04/22/24    Authorization Type Medicare    Authorization - Visit Number 4    Progress Note Due on Visit 10    PT Start Time 1018    PT Stop Time 1058    PT Time Calculation (min) 40 min    Activity Tolerance Patient tolerated treatment well    Behavior During Therapy WFL for tasks assessed/performed            Past Medical History:  Diagnosis Date   Allergic bronchitis    Allergic bronchitis    Allergy     Arthritis    Claustrophobia    Dependent edema    GERD (gastroesophageal reflux disease)    occas problem - burning, cough would take prilosec if needed   H/O hiatal hernia    HSV-2 (herpes simplex virus 2) infection    Hypothyroidism    Peripheral neuropathy    3 toes right foot 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 problem   Psoriatic arthritis (HCC) 02/18/2019   Thyroid  disease    Past Surgical History:  Procedure Laterality Date   BACK SURGERY     BREAST BIOPSY  1972   benign   CARPAL TUNNEL RELEASE Left 12/17/2018   Procedure: LEFT CARPAL TUNNEL RELEASE;  Surgeon: Murrell Drivers, MD;  [facility]:  [facility];  Service: Orthopedics;  Laterality: Left;   CHOLECYSTECTOMY     LUMBAR LAMINECTOMY  1999   L4   LUMBAR LAMINECTOMY  1981   L5   TOTAL HIP ARTHROPLASTY Right 07/02/2012   Procedure: RIGHT TOTAL HIP ARTHROPLASTY ANTERIOR APPROACH;  Surgeon: Lonni CINDERELLA Poli, MD;  Location: [facility];  Service: Orthopedics;  Laterality: Right;   Patient Active Problem List   Diagnosis Date Noted   Psoriatic arthritis (HCC) 03/14/2019   Chronic venous insufficiency 11/25/2018   Dependent edema 09/04/2018   Asymptomatic postmenopausal estrogen deficiency 01/03/2018   History of hip  replacement, total, unspecified laterality 06/22/2017   Bilateral sensorineural hearing loss 01/09/2016   Bilateral tinnitus 01/09/2016   Age-related nuclear cataract of both eyes 09/21/2015   Choroidal nevus, left eye 09/21/2015   Chronic dryness of both eyes 09/21/2015   Dermatochalasis of eyelid 09/21/2015   Presbyopia of both eyes 09/21/2015   PVD (posterior vitreous detachment), both eyes 09/21/2015   Regular astigmatism of both eyes 09/21/2015   Lateral cystocele 07/23/2015   Rectocele 07/23/2015   Osteoarthritis of right hip 05/24/2012   GE reflux 09/23/2011   Hypothyroidism 01/07/2011   Anxiety 01/07/2011   Allergic rhinitis 01/07/2011   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 She has had more pain with walking and standing in her ankles. 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 She states that her inflammatory markers are the best they have been  in 4 years. She states her MD recommended that she do PT at this time because she is less stiff and swollen and is now able to exercise.  She states she has tightness in her hands and is now better able to close them. She reports muscle aches in her legs and occasional pain in her ankles. Mostly she feels squeezing and tightness. She states pain decreases with use of voltaran gel and with changing positions. Pt is usually walks 2 miles every day but she states this is getting more difficult due to muscle aches  PERTINENT HISTORY: Rt THA, lumbar laminectomy, carpal  tunnel release, OA, RA PAIN:  Are you having pain? Yes: NPRS scale: 4/10 ankles Pain location: ankles Pain description: zing, sore, tight Aggravating factors: standing and walking Relieving factors: voltaran  PRECAUTIONS: None  RED FLAGS: None   WEIGHT BEARING RESTRICTIONS: No  FALLS:  Has patient fallen in last 6 months? No   OCCUPATION: retired [employer], walks daily, pickleball  PLOF: Independent  PATIENT GOALS: be able to walk 2 miles and play pickleball without pain  NEXT MD VISIT: 05/2024  OBJECTIVE:  Note: Objective measures were completed at Evaluation unless otherwise noted.  DIAGNOSTIC FINDINGS: none on file  PATIENT SURVEYS:  PSFS: THE PATIENT SPECIFIC FUNCTIONAL SCALE  Place score of 0-10 (0 = unable to perform activity and 10 = able to perform activity at the same level as before injury or problem)  Activity Date: 02/26/24 03/23/24   Walk 2 miles 3 7   2.     3.     4.      Total Score 3 7     Total Score = Sum of activity scores/number of activities  Minimally Detectable Change: 3 points (for single activity); 2 points (for average score)  [facility] (nd). The Patient Specific Functional Scale . Retrieved from [email or web address]   COGNITION: Overall cognitive status: Within functional limits for tasks assessed      PALPATION: Circumferential tenderness Rt ankle  LOWER EXTREMITY ROM: Unable to flex Rt 2nd toe, decreased toe flexion Rt foot  Active ROM Right eval Left eval Rt/Lt 03/23/24  Hip flexion     Hip extension     Hip abduction     Hip adduction     Hip internal rotation     Hip external rotation     Knee flexion     Knee extension     Ankle dorsiflexion -3 3 1/2  Ankle plantarflexion 50 62 5160  Ankle inversion 16 20 20/20  Ankle eversion 6 8 18/15   (Blank rows = not tested)  LOWER EXTREMITY MMT:  MMT Right eval Left eval  Hip flexion 4+ 4-  Hip extension     Hip abduction    Hip adduction    Hip internal rotation    Hip external rotation    Knee flexion 4 4  Knee extension 4 4  Ankle dorsiflexion 3 4  Ankle plantarflexion 4- 4-  Ankle inversion 4- 3  Ankle eversion 3 4-   (Blank rows = not tested)    FUNCTIONAL TESTS:  5 times sit to stand: 9.98 seconds SL heel raise = 4 bilat SLS: Lt = 8.68, Rt 4.64   [facility] Adult PT Treatment:  DATE: 03/23/24 Therapeutic Exercise: Standing gastroc and soleus stretches 2 x 30 sec Peroneal nerve stretch supine with strap ROM assessment (see above)  Neuromuscular re-ed: Standing blue TB pull down with slow march for balance and core strength Tandem stance on airex with green TB Row x 15 bilat (CGA) Side step yellow TB around feet Step ups on blue Bosu x 10 bilat with UE support SLS on ground x 20 sec bilat - intermittent UE support Tandem stance on ground with head turns   [facility] Adult PT Treatment:                                                DATE: 03/18/2024 Therapeutic Exercise: Standing gastroc and soleus stretches 2 x 30 sec Peroneal nerve stretch supine with strap  Neuromuscular re-ed: Heel walking/toe walking fwd/bkwd Side step yellow TB around feet Sidelying Clam shell red TB 2 x 10 Hip over and back 2 x 10 Standing blue TB pull down with slow march for balance and core strength Therapeutic Activity: Side step on airex beam Tandem walking on airex beam   [facility] Adult PT Treatment:                                                DATE: 03/04/24 Therapeutic Exercise: Standing gastroc & soleus stretches  Neuromuscular re-ed: Standing Rocker board PF/DF x 1 min, laterally x 1 min with intermittent UE support Forward weight shifting with Rt toes raised on towel roll Great toe raises on towel roll --> in standing & with gastroc stretch Sidelying Clam shell 2 x 10 Hip Over and back x 10 Therapeutic Activity: Standing heel raises +  tennis ball b/w ankles x 20 Standing toe raises 10 x 3 sec Ankle inv/ev yellow TB x 20 bilat - assistance to prevent compensation with hips     PATIENT EDUCATION:  Education details: Updated HEP Person educated: Patient Education method: Explanation, Demonstration, and Handouts Education comprehension: verbalized understanding and returned demonstration  HOME EXERCISE PROGRAM: Access Code: [email or web address] URL: [email or web address] Date: 03/23/2024 Prepared by: Darice Conine  Exercises - Gastroc Stretch on Wall  - 1 x daily - 7 x weekly - 1 sets - 3 reps - 20-30 seconds hold - Soleus Stretch on Wall  - 1 x daily - 7 x weekly - 1 sets - 3 reps - 20-30 seconds hold - Ankle Eversion with Resistance  - 1 x daily - 7 x weekly - 3 sets - 10 reps - Ankle Inversion with Resistance  - 1 x daily - 7 x weekly - 3 sets - 10 reps - Clamshell at Wall with Resistance  - 1 x daily - 7 x weekly - 1-3 sets - 10 reps - 3 sec hold - Sidelying Over and Back  - 1 x daily - 7 x weekly - 1-3 sets - 10 reps - Seated Self Great Toe Stretch  - 1 x daily - 7 x weekly - 1 sets - 5 reps - 10 sec hold - Supine Peroneal Nerve Glide  - 1 x daily - 7 x weekly - 1 sets - 10 reps - Side Stepping with Resistance at Feet  - 1 x daily - 7 x weekly - 3  sets - 10 reps - Tandem Stance with Head Rotation  - 1 x daily - 7 x weekly - 3 sets - 10 reps  ASSESSMENT:  CLINICAL IMPRESSION: Pt has made improvements in ankle ROM and reports feeling less stiff since beginning therapy. She is able to tolerate exercises well. Progressed balance with tandem stance on ground and on foam with good tolerance. She is progressing well towards goals  EVAL: Patient is a 79 y.o. female who was seen today for physical therapy evaluation and treatment for inflammatory arthritis. She presents with decreased LE strength, decreased ankle strength and mobility, decreased balance and decreased functional activity tolerance. She will  benefit from skilled PT to address deficits and improve functional ROM.   OBJECTIVE IMPAIRMENTS: decreased activity tolerance, difficulty walking, decreased ROM, decreased strength, and pain.     GOALS: Goals reviewed with patient? Yes  SHORT TERM GOALS: Target date: 03/25/2024  Pt will be independent in initial HEP Baseline: Goal status: MET  2.  Pt will improve Rt ankle ROM by 5 degrees in all directions to improve walking tolerance Baseline:  Goal status: INITIAL   LONG TERM GOALS: Target date: 04/22/2024  Pt will be independent with advanced HEP Baseline:  Goal status: INITIAL  2.  Pt will improve LE strength to 4+/5 to be able to walk 2 miles with decreased pain Baseline:  Goal status: INITIAL  3.  Pt will demo improved balance by performing SLS >= 10 seconds bilat Baseline:  Goal status: INITIAL  4.  Pt will improve PSFS to >= 6 to demo improved functional mobility Baseline:  Goal status: INITIAL  5.  Pt will demo improved functional strength by improving 5 x STS to <= 8 seconds Baseline:  Goal status: INITIAL   PLAN:  PT FREQUENCY: 2x/week  PT DURATION: 8 weeks  PLANNED INTERVENTIONS: 97164- PT Re-evaluation, 97110-Therapeutic exercises, 97530- Therapeutic activity, 97112- Neuromuscular re-education, 97535- Self Care, 02859- Manual therapy, [id number]- Gait training, [phone]- Aquatic Therapy, [phone]- Electrical stimulation (unattended), 97016- Vasopneumatic device, [id number]- Ionotophoresis 4mg /ml Dexamethasone , 79439 (1-2 muscles), 20561 (3+ muscles)- Dry Needling, Patient/Family education, Balance training, Taping, Cryotherapy, and Moist heat  PLAN FOR NEXT SESSION: Progress ankle & glute strength and mobility, static/dynamic balance, great toe extension/mobility, core strength. Update HEP as needed.   Ahron Hulbert, PT 03/23/2024, 11:00 AM  "

## 2024-03-25 ENCOUNTER — Ambulatory Visit: Admitting: Physical Therapy

## 2024-03-29 ENCOUNTER — Encounter: Payer: Self-pay | Admitting: Internal Medicine

## 2024-03-30 ENCOUNTER — Encounter: Payer: Self-pay | Admitting: Physical Therapy

## 2024-03-30 ENCOUNTER — Ambulatory Visit: Admitting: Physical Therapy

## 2024-03-30 DIAGNOSIS — M6281 Muscle weakness (generalized): Secondary | ICD-10-CM

## 2024-03-30 DIAGNOSIS — R262 Difficulty in walking, not elsewhere classified: Secondary | ICD-10-CM

## 2024-03-30 NOTE — Therapy (Signed)
 " OUTPATIENT PHYSICAL THERAPY LOWER EXTREMITY TREATMENT   Patient Name: [name] MRN: [id number] DOB:[birth year], 79 y.o., female Today's Date: 03/30/2024  END OF SESSION:  PT End of Session - 03/30/24 0929     Visit Number 6    Number of Visits 16    Date for Recertification  04/22/24    Authorization Type Medicare    Authorization - Visit Number 5    Progress Note Due on Visit 10    PT Start Time 0845    PT Stop Time 0927    PT Time Calculation (min) 42 min    Activity Tolerance Patient tolerated treatment well    Behavior During Therapy [facility] for tasks assessed/performed             Past Medical History:  Diagnosis Date   Allergic bronchitis    Allergic bronchitis    Allergy     Arthritis    Claustrophobia    Dependent edema    GERD (gastroesophageal reflux disease)    occas problem - burning, cough would take prilosec if needed   H/O hiatal hernia    HSV-2 (herpes simplex virus 2) infection    Hypothyroidism    Peripheral neuropathy    3 toes right foot 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 problem   Psoriatic arthritis (HCC) 02/18/2019   Thyroid  disease    Past Surgical History:  Procedure Laterality Date   BACK SURGERY     BREAST BIOPSY  1972   benign   CARPAL TUNNEL RELEASE Left 12/17/2018   Procedure: LEFT CARPAL TUNNEL RELEASE;  Surgeon: Murrell Drivers, MD;  Location: [facility];  Service: Orthopedics;  Laterality: Left;   CHOLECYSTECTOMY     LUMBAR LAMINECTOMY  1999   L4   LUMBAR LAMINECTOMY  1981   L5   TOTAL HIP ARTHROPLASTY Right 07/02/2012   Procedure: RIGHT TOTAL HIP ARTHROPLASTY ANTERIOR APPROACH;  Surgeon: Lonni CINDERELLA Poli, MD;  Location: [facility];  Service: Orthopedics;  Laterality: Right;   Patient Active Problem List   Diagnosis Date Noted   Psoriatic arthritis (HCC) 03/14/2019   Chronic venous insufficiency 11/25/2018   Dependent edema 09/04/2018   Asymptomatic postmenopausal estrogen deficiency 01/03/2018   History of  hip replacement, total, unspecified laterality 06/22/2017   Bilateral sensorineural hearing loss 01/09/2016   Bilateral tinnitus 01/09/2016   Age-related nuclear cataract of both eyes 09/21/2015   Choroidal nevus, left eye 09/21/2015   Chronic dryness of both eyes 09/21/2015   Dermatochalasis of eyelid 09/21/2015   Presbyopia of both eyes 09/21/2015   PVD (posterior vitreous detachment), both eyes 09/21/2015   Regular astigmatism of both eyes 09/21/2015   Lateral cystocele 07/23/2015   Rectocele 07/23/2015   Osteoarthritis of right hip 05/24/2012   GE reflux 09/23/2011   Hypothyroidism 01/07/2011   Anxiety 01/07/2011   Allergic rhinitis 01/07/2011   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 She states  that her inflammatory markers are the best they have been in 4 years. She states her MD recommended that she do PT at this time because she is less stiff and swollen and is now able to exercise.  She states she has tightness in her hands and is now better able to close them. She reports muscle aches in her legs and occasional pain in her ankles. Mostly she feels squeezing and tightness. She states pain decreases with use of voltaran gel and with changing positions. Pt is usually walks 2 miles every day but she states this is getting more difficult due to muscle aches  PERTINENT  HISTORY: Rt THA, lumbar laminectomy, carpal tunnel release, OA, RA PAIN:  Are you having pain? Yes: NPRS scale: 3/10 ankles Pain location: ankles Pain description: zing, sore, tight Aggravating factors: standing and walking Relieving factors: voltaran  PRECAUTIONS: None  RED FLAGS: None   WEIGHT BEARING RESTRICTIONS: No  FALLS:  Has patient fallen in last 6 months? No   OCCUPATION: retired [employer], walks daily, pickleball  PLOF: Independent  PATIENT GOALS: be able to walk 2 miles and play pickleball without pain  NEXT MD VISIT: 05/2024  OBJECTIVE:  Note: Objective measures were completed at Evaluation unless otherwise noted.  DIAGNOSTIC FINDINGS: none on file  PATIENT SURVEYS:  PSFS: THE PATIENT SPECIFIC FUNCTIONAL SCALE  Place score of 0-10 (0 = unable to perform activity and 10 = able to perform activity at the same level as before injury or problem)  Activity Date: 02/26/24 03/23/24   Walk 2 miles 3 7   2.     3.     4.      Total Score 3 7     Total Score = Sum of activity scores/number of activities  Minimally Detectable Change: 3 points (for single activity); 2 points (for average score)  [name] (nd). The Patient Specific Functional Scale . Retrieved from [email or web address]   COGNITION: Overall cognitive status: Within functional limits for tasks assessed      PALPATION: Circumferential tenderness Rt ankle  LOWER EXTREMITY ROM: Unable to flex Rt 2nd toe, decreased toe flexion Rt foot  Active ROM Right eval Left eval Rt/Lt 03/23/24  Hip flexion     Hip extension     Hip abduction     Hip adduction     Hip internal rotation     Hip external rotation     Knee flexion     Knee extension     Ankle dorsiflexion -3 3 1/2  Ankle plantarflexion 50 62 5160  Ankle inversion 16 20 20/20  Ankle eversion 6 8 18/15   (Blank rows = not tested)  LOWER EXTREMITY MMT:  MMT Right eval  Left eval  Hip flexion 4+ 4-  Hip extension    Hip abduction    Hip adduction    Hip internal rotation    Hip external rotation    Knee flexion 4 4  Knee extension 4 4  Ankle dorsiflexion 3 4  Ankle plantarflexion 4- 4-  Ankle inversion 4- 3  Ankle eversion 3 4-   (Blank rows = not tested)    FUNCTIONAL TESTS:  5 times sit to stand: 9.98 seconds SL heel raise = 4 bilat SLS: Lt = 8.68, Rt 4.64   [facility] Adult PT Treatment:  DATE: 03/30/24 Therapeutic Exercise: Peroneal nerve stretch supine with strap Seated ankle circles bilat after mobs Leg press DL 09# x 15, SL 54# x 10 bilat Manual Therapy: TC and mid foot mobs to improve Lt ankle mobility Neuromuscular re-ed: BAPS board seated L2 - difficult! Requires assistance Standing rocker board A/P and lateral for balance and stretch Step ups on blue Bosu 2 x 10 bilat with UE support SLS 2 x 20 sec bilat with intermittent UE support Tandem stance with head turns - intermittent UE support   [facility] Adult PT Treatment:                                                DATE: 03/23/24 Therapeutic Exercise: Standing gastroc and soleus stretches 2 x 30 sec Peroneal nerve stretch supine with strap ROM assessment (see above)  Neuromuscular re-ed: Standing blue TB pull down with slow march for balance and core strength Tandem stance on airex with green TB Row x 15 bilat (CGA) Side step yellow TB around feet Step ups on blue Bosu x 10 bilat with UE support SLS on ground x 20 sec bilat - intermittent UE support Tandem stance on ground with head turns   [facility] Adult PT Treatment:                                                DATE: 03/18/2024 Therapeutic Exercise: Standing gastroc and soleus stretches 2 x 30 sec Peroneal nerve stretch supine with strap  Neuromuscular re-ed: Heel walking/toe walking fwd/bkwd Side step yellow TB around feet Sidelying Clam shell red TB 2 x 10 Hip over and back 2  x 10 Standing blue TB pull down with slow march for balance and core strength Therapeutic Activity: Side step on airex beam Tandem walking on airex beam   [facility] Adult PT Treatment:                                                DATE: 03/04/24 Therapeutic Exercise: Standing gastroc & soleus stretches  Neuromuscular re-ed: Standing Rocker board PF/DF x 1 min, laterally x 1 min with intermittent UE support Forward weight shifting with Rt toes raised on towel roll Great toe raises on towel roll --> in standing & with gastroc stretch Sidelying Clam shell 2 x 10 Hip Over and back x 10 Therapeutic Activity: Standing heel raises + tennis ball b/w ankles x 20 Standing toe raises 10 x 3 sec Ankle inv/ev yellow TB x 20 bilat - assistance to prevent compensation with hips     PATIENT EDUCATION:  Education details: Updated HEP Person educated: Patient Education method: Explanation, Demonstration, and Handouts Education comprehension: verbalized understanding and returned demonstration  HOME EXERCISE PROGRAM: Access Code: [email or web address] URL: [email or web address] Date: 03/23/2024 Prepared by: [name]  Exercises - Gastroc Stretch on Wall  - 1 x daily - 7 x weekly - 1 sets - 3 reps - 20-30 seconds hold - Soleus Stretch on Wall  - 1 x daily - 7 x weekly - 1 sets - 3 reps - 20-30 seconds hold - Ankle Eversion with Resistance  -  1 x daily - 7 x weekly - 3 sets - 10 reps - Ankle Inversion with Resistance  - 1 x daily - 7 x weekly - 3 sets - 10 reps - Clamshell at Wall with Resistance  - 1 x daily - 7 x weekly - 1-3 sets - 10 reps - 3 sec hold - Sidelying Over and Back  - 1 x daily - 7 x weekly - 1-3 sets - 10 reps - Seated Self Great Toe Stretch  - 1 x daily - 7 x weekly - 1 sets - 5 reps - 10 sec hold - Supine Peroneal Nerve Glide  - 1 x daily - 7 x weekly - 1 sets - 10 reps - Side Stepping with Resistance at Feet  - 1 x daily - 7 x weekly - 3 sets - 10 reps - Tandem  Stance with Head Rotation  - 1 x daily - 7 x weekly - 3 sets - 10 reps  ASSESSMENT:  CLINICAL IMPRESSION: Pt with good response to manual work today. She has difficulty with motor coordination with baps board. No increased pain during exercise today. She does have a phone call scheduled with rheumatology to address increased pain from last week  EVAL: Patient is a 79 y.o. female who was seen today for physical therapy evaluation and treatment for inflammatory arthritis. She presents with decreased LE strength, decreased ankle strength and mobility, decreased balance and decreased functional activity tolerance. She will benefit from skilled PT to address deficits and improve functional ROM.   OBJECTIVE IMPAIRMENTS: decreased activity tolerance, difficulty walking, decreased ROM, decreased strength, and pain.     GOALS: Goals reviewed with patient? Yes  SHORT TERM GOALS: Target date: 03/25/2024  Pt will be independent in initial HEP Baseline: Goal status: MET  2.  Pt will improve Rt ankle ROM by 5 degrees in all directions to improve walking tolerance Baseline:  Goal status: INITIAL   LONG TERM GOALS: Target date: 04/22/2024  Pt will be independent with advanced HEP Baseline:  Goal status: INITIAL  2.  Pt will improve LE strength to 4+/5 to be able to walk 2 miles with decreased pain Baseline:  Goal status: INITIAL  3.  Pt will demo improved balance by performing SLS >= 10 seconds bilat Baseline:  Goal status: INITIAL  4.  Pt will improve PSFS to >= 6 to demo improved functional mobility Baseline:  Goal status: INITIAL  5.  Pt will demo improved functional strength by improving 5 x STS to <= 8 seconds Baseline:  Goal status: INITIAL   PLAN:  PT FREQUENCY: 2x/week  PT DURATION: 8 weeks  PLANNED INTERVENTIONS: 97164- PT Re-evaluation, 97110-Therapeutic exercises, 97530- Therapeutic activity, 97112- Neuromuscular re-education, 97535- Self Care, 02859- Manual therapy,  [id number]- Gait training, [phone]- Aquatic Therapy, [phone]- Electrical stimulation (unattended), 97016- Vasopneumatic device, [id number]- Ionotophoresis 4mg /ml Dexamethasone , 79439 (1-2 muscles), 20561 (3+ muscles)- Dry Needling, Patient/Family education, Balance training, Taping, Cryotherapy, and Moist heat  PLAN FOR NEXT SESSION: Progress ankle & glute strength and mobility, static/dynamic balance, great toe extension/mobility, core strength. Update HEP as needed.   [name], PT 03/30/2024, 9:30 AM  "

## 2024-04-01 ENCOUNTER — Encounter: Payer: Self-pay | Admitting: Physical Therapy

## 2024-04-01 ENCOUNTER — Ambulatory Visit: Admitting: Physical Therapy

## 2024-04-01 DIAGNOSIS — R262 Difficulty in walking, not elsewhere classified: Secondary | ICD-10-CM

## 2024-04-01 DIAGNOSIS — M6281 Muscle weakness (generalized): Secondary | ICD-10-CM

## 2024-04-01 NOTE — Therapy (Signed)
 " OUTPATIENT PHYSICAL THERAPY LOWER EXTREMITY TREATMENT   Patient Name: [name] MRN: [id number] DOB:10-05-1945, 79 y.o., female Today's Date: 04/01/2024  END OF SESSION:  PT End of Session - 04/01/24 0840     Visit Number 7    Number of Visits 16    Date for Recertification  04/22/24    Authorization Type Medicare    Authorization - Visit Number 6    Progress Note Due on Visit 10    PT Start Time 0800    PT Stop Time 0841    PT Time Calculation (min) 41 min    Activity Tolerance Patient tolerated treatment well    Behavior During Therapy [facility] for tasks assessed/performed              Past Medical History:  Diagnosis Date   Allergic bronchitis    Allergic bronchitis    Allergy     Arthritis    Claustrophobia    Dependent edema    GERD (gastroesophageal reflux disease)    occas problem - burning, cough would take prilosec if needed   H/O hiatal hernia    HSV-2 (herpes simplex virus 2) infection    Hypothyroidism    Peripheral neuropathy    3 toes right foot 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 problem   Psoriatic arthritis (HCC) 02/18/2019   Thyroid  disease    Past Surgical History:  Procedure Laterality Date   BACK SURGERY     BREAST BIOPSY  1972   benign   CARPAL TUNNEL RELEASE Left 12/17/2018   Procedure: LEFT CARPAL TUNNEL RELEASE;  Surgeon: Murrell Drivers, MD;  Location: [facility];  Service: Orthopedics;  Laterality: Left;   CHOLECYSTECTOMY     LUMBAR LAMINECTOMY  1999   L4   LUMBAR LAMINECTOMY  1981   L5   TOTAL HIP ARTHROPLASTY Right 07/02/2012   Procedure: RIGHT TOTAL HIP ARTHROPLASTY ANTERIOR APPROACH;  Surgeon: Lonni CINDERELLA Poli, MD;  Location: [facility];  Service: Orthopedics;  Laterality: Right;   Patient Active Problem List   Diagnosis Date Noted   Psoriatic arthritis (HCC) 03/14/2019   Chronic venous insufficiency 11/25/2018   Dependent edema 09/04/2018   Asymptomatic postmenopausal estrogen deficiency 01/03/2018   History of  hip replacement, total, unspecified laterality 06/22/2017   Bilateral sensorineural hearing loss 01/09/2016   Bilateral tinnitus 01/09/2016   Age-related nuclear cataract of both eyes 09/21/2015   Choroidal nevus, left eye 09/21/2015   Chronic dryness of both eyes 09/21/2015   Dermatochalasis of eyelid 09/21/2015   Presbyopia of both eyes 09/21/2015   PVD (posterior vitreous detachment), both eyes 09/21/2015   Regular astigmatism of both eyes 09/21/2015   Lateral cystocele 07/23/2015   Rectocele 07/23/2015   Osteoarthritis of right hip 05/24/2012   GE reflux 09/23/2011   Hypothyroidism 01/07/2011   Anxiety 01/07/2011   Allergic rhinitis 01/07/2011   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    PCP: Perri Ronal PARAS  REFERRING PROVIDER: Monnie Cedars  REFERRING DIAG: inflammatory arthritis  THERAPY DIAG:  Difficulty in walking, not elsewhere classified  Muscle weakness (generalized)  Rationale for Evaluation and Treatment: Rehabilitation  ONSET DATE: 02/2024 (MD appt)  SUBJECTIVE:   SUBJECTIVE STATEMENT: Pt states she went to an event where she stood for 1.5 hours wearing dress shoes and she had a lot of pain afterwards. 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 She states that her inflammatory markers are the best  they have been in 4 years. She states her MD recommended that she do PT at this time because she is less stiff and swollen and is now able to exercise.  She states she has tightness in her hands and is now better able to close them. She reports muscle aches in her legs and occasional pain in her ankles. Mostly she feels squeezing and tightness. She states pain decreases with use of voltaran gel and with changing positions. Pt is usually walks 2 miles every day but she states this is getting more difficult due to muscle aches  PERTINENT HISTORY: Rt THA, lumbar laminectomy, carpal  tunnel release, OA, RA PAIN:  Are you having pain? Yes: NPRS scale: 2/10 ankles Pain location: ankles Pain description: zing, sore, tight Aggravating factors: standing and walking Relieving factors: voltaran  PRECAUTIONS: None  RED FLAGS: None   WEIGHT BEARING RESTRICTIONS: No  FALLS:  Has patient fallen in last 6 months? No   OCCUPATION: retired [employer], walks daily, pickleball  PLOF: Independent  PATIENT GOALS: be able to walk 2 miles and play pickleball without pain  NEXT MD VISIT: 05/2024  OBJECTIVE:  Note: Objective measures were completed at Evaluation unless otherwise noted.  DIAGNOSTIC FINDINGS: none on file  PATIENT SURVEYS:  PSFS: THE PATIENT SPECIFIC FUNCTIONAL SCALE  Place score of 0-10 (0 = unable to perform activity and 10 = able to perform activity at the same level as before injury or problem)  Activity Date: 02/26/24 03/23/24   Walk 2 miles 3 7   2.     3.     4.      Total Score 3 7     Total Score = Sum of activity scores/number of activities  Minimally Detectable Change: 3 points (for single activity); 2 points (for average score)  [facility] (nd). The Patient Specific Functional Scale . Retrieved from [email or web address]   COGNITION: Overall cognitive status: Within functional limits for tasks assessed      PALPATION: Circumferential tenderness Rt ankle  LOWER EXTREMITY ROM: Unable to flex Rt 2nd toe, decreased toe flexion Rt foot  Active ROM Right eval Left eval Rt/Lt 03/23/24  Hip flexion     Hip extension     Hip abduction     Hip adduction     Hip internal rotation     Hip external rotation     Knee flexion     Knee extension     Ankle dorsiflexion -3 3 1/2  Ankle plantarflexion 50 62 5160  Ankle inversion 16 20 20/20  Ankle eversion 6 8 18/15   (Blank rows = not tested)  LOWER EXTREMITY MMT:  MMT Right eval Left eval  Hip flexion 4+ 4-  Hip extension     Hip abduction    Hip adduction    Hip internal rotation    Hip external rotation    Knee flexion 4 4  Knee extension 4 4  Ankle dorsiflexion 3 4  Ankle plantarflexion 4- 4-  Ankle inversion 4- 3  Ankle eversion 3 4-   (Blank rows = not tested)    FUNCTIONAL TESTS:  5 times sit to stand: 9.98 seconds SL heel raise = 4 bilat SLS: Lt = 8.68, Rt 4.64   [facility] Adult PT Treatment:  DATE: 04/01/24 Therapeutic Exercise: Seated ankle circles bilat after mobs Seated rocker board for DF/PF stretch Leg press DL 09# x 15, SL 54# x 15 bilat Manual Therapy: TC and mid foot mobs to improve Rt ankle mobility Neuromuscular re-ed: Tandem stance on foam 2 x 30 sec bilat SLS on foam 2 x 30 sec bilat --> SLS on ground 2 x 30 sec bilat Therapeutic Activity: Heel raise with ball between heels 2 x 10 Side step yellow TB around feet Lateral step down 6'' step for DF stretch and LE strengthening   [facility] Adult PT Treatment:                                                DATE: 03/30/24 Therapeutic Exercise: Peroneal nerve stretch supine with strap Seated ankle circles bilat after mobs Leg press DL 09# x 15, SL 54# x 10 bilat Manual Therapy: TC and mid foot mobs to improve Lt ankle mobility Neuromuscular re-ed: BAPS board seated L2 - difficult! Requires assistance Standing rocker board A/P and lateral for balance and stretch Step ups on blue Bosu 2 x 10 bilat with UE support SLS 2 x 20 sec bilat with intermittent UE support Tandem stance with head turns - intermittent UE support   [facility] Adult PT Treatment:                                                DATE: 03/23/24 Therapeutic Exercise: Standing gastroc and soleus stretches 2 x 30 sec Peroneal nerve stretch supine with strap ROM assessment (see above)  Neuromuscular re-ed: Standing blue TB pull down with slow march for balance and core strength Tandem stance on airex with green TB Row x 15  bilat (CGA) Side step yellow TB around feet Step ups on blue Bosu x 10 bilat with UE support SLS on ground x 20 sec bilat - intermittent UE support Tandem stance on ground with head turns   [facility] Adult PT Treatment:                                                DATE: 03/18/2024 Therapeutic Exercise: Standing gastroc and soleus stretches 2 x 30 sec Peroneal nerve stretch supine with strap  Neuromuscular re-ed: Heel walking/toe walking fwd/bkwd Side step yellow TB around feet Sidelying Clam shell red TB 2 x 10 Hip over and back 2 x 10 Standing blue TB pull down with slow march for balance and core strength Therapeutic Activity: Side step on airex beam Tandem walking on airex beam    PATIENT EDUCATION:  Education details: Updated HEP Person educated: Patient Education method: Explanation, Demonstration, and Handouts Education comprehension: verbalized understanding and returned demonstration  HOME EXERCISE PROGRAM: Access Code: [email or web address] URL: [email or web address] Date: 03/23/2024 Prepared by: Darice Conine  Exercises - Gastroc Stretch on Wall  - 1 x daily - 7 x weekly - 1 sets - 3 reps - 20-30 seconds hold - Soleus Stretch on Wall  - 1 x daily - 7 x weekly - 1 sets - 3 reps - 20-30 seconds hold - Ankle Eversion with Resistance  -  1 x daily - 7 x weekly - 3 sets - 10 reps - Ankle Inversion with Resistance  - 1 x daily - 7 x weekly - 3 sets - 10 reps - Clamshell at Wall with Resistance  - 1 x daily - 7 x weekly - 1-3 sets - 10 reps - 3 sec hold - Sidelying Over and Back  - 1 x daily - 7 x weekly - 1-3 sets - 10 reps - Seated Self Great Toe Stretch  - 1 x daily - 7 x weekly - 1 sets - 5 reps - 10 sec hold - Supine Peroneal Nerve Glide  - 1 x daily - 7 x weekly - 1 sets - 10 reps - Side Stepping with Resistance at Feet  - 1 x daily - 7 x weekly - 3 sets - 10 reps - Tandem Stance with Head Rotation  - 1 x daily - 7 x weekly - 3 sets - 10  reps  ASSESSMENT:  CLINICAL IMPRESSION: Session began with mobs to Rt ankle as pt states the mobs really helped Lt ankle after last session. Progressed balance and LE strengthening with good tolerance. Some tightness at end of session but had good relief with stretching at end of session  EVAL: Patient is a 79 y.o. female who was seen today for physical therapy evaluation and treatment for inflammatory arthritis. She presents with decreased LE strength, decreased ankle strength and mobility, decreased balance and decreased functional activity tolerance. She will benefit from skilled PT to address deficits and improve functional ROM.   OBJECTIVE IMPAIRMENTS: decreased activity tolerance, difficulty walking, decreased ROM, decreased strength, and pain.     GOALS: Goals reviewed with patient? Yes  SHORT TERM GOALS: Target date: 03/25/2024  Pt will be independent in initial HEP Baseline: Goal status: MET  2.  Pt will improve Rt ankle ROM by 5 degrees in all directions to improve walking tolerance Baseline:  Goal status: INITIAL   LONG TERM GOALS: Target date: 04/22/2024  Pt will be independent with advanced HEP Baseline:  Goal status: INITIAL  2.  Pt will improve LE strength to 4+/5 to be able to walk 2 miles with decreased pain Baseline:  Goal status: INITIAL  3.  Pt will demo improved balance by performing SLS >= 10 seconds bilat Baseline:  Goal status: INITIAL  4.  Pt will improve PSFS to >= 6 to demo improved functional mobility Baseline:  Goal status: INITIAL  5.  Pt will demo improved functional strength by improving 5 x STS to <= 8 seconds Baseline:  Goal status: INITIAL   PLAN:  PT FREQUENCY: 2x/week  PT DURATION: 8 weeks  PLANNED INTERVENTIONS: 97164- PT Re-evaluation, 97110-Therapeutic exercises, 97530- Therapeutic activity, 97112- Neuromuscular re-education, 97535- Self Care, 02859- Manual therapy, [id number]- Gait training, [phone]- Aquatic Therapy, [phone]-  Electrical stimulation (unattended), 97016- Vasopneumatic device, [id number]- Ionotophoresis 4mg /ml Dexamethasone , 79439 (1-2 muscles), 20561 (3+ muscles)- Dry Needling, Patient/Family education, Balance training, Taping, Cryotherapy, and Moist heat  PLAN FOR NEXT SESSION: Progress ankle & glute strength and mobility, static/dynamic balance, great toe extension/mobility, core strength. Update HEP as needed.   Aceton Kinnear, PT 04/01/2024, 8:41 AM  "

## 2024-04-05 NOTE — Progress Notes (Incomplete)
 "  Annual Wellness Visit   Patient Care Team: Mason, Sherry PARAS, MD as PCP - General (Internal Medicine) Sherry Slain, MD as PCP - Cardiology (Cardiology)  Visit Date: 04/05/24   No chief complaint on file.  Subjective:  Patient: [name], Female DOB: Jun 30, 1945, 79 y.o. MRN: [id number] There were no vitals filed for this visit. [name] is a 79 y.o. Female who presents today for her Annual Wellness Visit. Patient has Hypothyroidism; Anxiety; Allergic rhinitis; Herpes simplex type 2 infection; GE reflux; Osteoarthritis of right hip; History of hip replacement, total, unspecified laterality; Age-related nuclear cataract of both eyes; Asymptomatic postmenopausal estrogen deficiency; Bilateral sensorineural hearing loss; Bilateral tinnitus; Choroidal nevus, left eye; Chronic dryness of both eyes; Dermatochalasis of eyelid; Lateral cystocele; Presbyopia of both eyes; PVD (posterior vitreous detachment), both eyes; Rectocele; Regular astigmatism of both eyes; Dependent edema; Chronic venous insufficiency; and Psoriatic arthritis (HCC) on their problem list.  History of Hypothyroidism currently treated with 125 mcg Levothyroxine  daily.  We will defer management ot thyroid  replacement to [facility] Endocrinologist, Dr. Daine Mason.   History of Anxiety/Depression treated with 0.5 mg Xanax  nightly as needed.    History of Dependant Edema treated with 20 mg Demadex  daily. Previously has seen Dr. Lonni, Cardiologist. Compression stockings were recommended by Vascular Surgeon for Chronic Venous Insufficiency - stockings did not help. Was not thought to have Peripheral Vascular Disease. She was found to have an elevated eosinophil count and noticed her edema always got better with steroids.   History of Rheumatoid Arthritis; Psoriatic Arthritis just recently started on 25 mg Indomethacin  TID w/ meals - endorses improvement in pain. Was on Kineret before that, but was seen 03/2023  for injection site inflammatory cellulitis. Previously has also been on Remicade  per Dr. Ishmael, who initially diagnosed her Psoriatic Arthritis. In the past she has explained that a lot of her symptoms started when she was stung by fire ants in the summer of 2022. Was subsequently diagnosed by Dr. Lavell with Inflammatory Arthropathy and Psoriatic Arthritis, tried Simponi  every 2 months, and did well until the fire aunts stung her. Today, reports that her ROM in her shoulders is reduced, attributed to her arthritis, causing difficulty raising arms above her head which affects her ability getting dressed in the morning.    History of Restless Leg Syndrome treated with 0.5 mg Requip  TID.   Labs ***/***/*** {Labs (Optional):31667}   06/24/2023 Mammogram no mammographic evidence of malignancy. Repeat in one year.    01/27/2006 Colonoscopy Mild diverticulosis other wise normal examination.   Health Maintenance  Topic Date Due   Zoster Vaccines- Shingrix (1 of 2) Never done   COVID-19 Vaccine (7 - 2025-26 season) 11/16/2023   Medicare Annual Wellness (AWV)  04/14/2024   Mammogram  06/23/2024   DTaP/Tdap/Td (3 - Td or Tdap) 01/02/2028   Pneumococcal Vaccine: 50+ Years  Completed   Influenza Vaccine  Completed   Bone Density Scan  Completed   Meningococcal B Vaccine  Aged Out   Colonoscopy  Discontinued    {Man or Woman:32389}  Vaccine Counseling: Due for {Vaccines:32291::Influenza}; UTD on {Vaccines:32291::Influenza}  ROS Objective:  Vitals: body mass index is unknown because there is no height or weight on file.There were no vitals filed for this visit. Physical Exam  Current Outpatient Medications  Medication Instructions   ALPRAZolam  (XANAX ) 0.5 MG tablet TAKE 1 TABLET BY MOUTH EACH NIGHT AT BEDTIME AS NEEDED SLEEP   benzonatate  (TESSALON ) 100 mg, Oral, 2 times daily  PRN   buPROPion  (WELLBUTRIN  XL) 150 mg, Oral, Daily   EPINEPHrine  0.3 mg/0.3 mL IJ SOAJ injection No dose,  route, or frequency recorded.   glucosamine-chondroitin 500-400 MG tablet 1 tablet, 2 times daily   Humira (2 Pen) 40 mg, Every 14 days   Magnesium 400 MG CAPS Take by mouth.   Multiple Vitamin (MULTIVITAMIN) tablet 1 tablet, Daily   Multiple Vitamins-Minerals (PRESERVISION AREDS 2 PO) Take by mouth.   Omega-3 Fatty Acids (OMEGA 3 500 PO) Take by mouth.   OVER THE COUNTER MEDICATION Vitamin D  3  One daily   potassium chloride  SA (KLOR-CON  M) 20 MEQ tablet 20 mEq, Oral, Daily   rOPINIRole  (REQUIP ) 0.5 mg, Oral, 3 times daily   Synthroid  125 mcg, Daily before breakfast   torsemide  (DEMADEX ) 20 mg, Oral, Daily   Past Medical History:  Diagnosis Date   Allergic bronchitis    Allergic bronchitis    Allergy     Arthritis    Claustrophobia    Dependent edema    GERD (gastroesophageal reflux disease)    occas problem - burning, cough would take prilosec if needed   H/O hiatal hernia    HSV-2 (herpes simplex virus 2) infection    Hypothyroidism    Peripheral neuropathy    3 toes right foot 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 problem   Psoriatic arthritis (HCC) 02/18/2019   Thyroid  disease    Medical/Surgical History Narrative:  Allergic/Intolerant to: Allergies[1] *** - ***  *** - ***  *** - ***  *** - ***  *** - ***  *** - ***  *** - ***  *** - *** Other - Hx of: *** ; Surghx of: *** Past Surgical History:  Procedure Laterality Date   BACK SURGERY     BREAST BIOPSY  1972   benign   CARPAL TUNNEL RELEASE Left 12/17/2018   Procedure: LEFT CARPAL TUNNEL RELEASE;  Surgeon: Murrell Drivers, MD;  Location: [facility];  Service: Orthopedics;  Laterality: Left;   CHOLECYSTECTOMY     LUMBAR LAMINECTOMY  1999   L4   LUMBAR LAMINECTOMY  1981   L5   TOTAL HIP ARTHROPLASTY Right 07/02/2012   Procedure: RIGHT TOTAL HIP ARTHROPLASTY ANTERIOR APPROACH;  Surgeon: Sherry CINDERELLA Poli, MD;  Location: [facility];  Service: Orthopedics;  Laterality: Right;   Family History   Problem Relation Age of Onset   Heart disease Father    Hypertension Sister    Heart disease Brother    Asthma Daughter    Hypertension Sister    Family History Narrative: {ELFamHX:31110} Social History   Social History Narrative   Married - 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 Patient used to work as a [employer] but no longer works outside the home. Non-smoker. Social alcohol consumption. Lost a stepson to suicide in 2018. 2 adult daughters from her previous marriage.   Most Recent Health Risks Assessment:   Most Recent Social Determinants of Health (Including Hx of Tobacco, Alcohol, and Drug Use) SDOH Screenings   Food Insecurity: No Food Insecurity (01/29/2024)  Housing: Low Risk (01/29/2024)  Transportation Needs: No Transportation Needs (01/29/2024)  Utilities: Not At Risk (12/07/2022)   Received from [facility]  Alcohol Screen: Low Risk (01/29/2024)  Depression (PHQ2-9): Medium Risk (12/17/2023)  Financial Resource Strain: Low Risk (01/29/2024)  Physical Activity: Sufficiently Active (01/29/2024)  Social Connections: Socially Integrated (01/29/2024)  Stress: No Stress Concern Present (01/29/2024)  Recent Concern: Stress - Stress Concern  Present (12/15/2023)  Tobacco Use: Low Risk (04/01/2024)   Social History[2] Most Recent Functional Status Assessment:     No data to display         Most Recent Fall Risk Assessment:    02/04/2024    3:30 PM  Fall Risk   Falls in the past year? 0  Number falls in past yr: 0  Injury with Fall? 0   Risk for fall due to : No Fall Risks  Follow up Falls evaluation completed     Data saved with a previous flowsheet row definition   Most Recent Anxiety/Depression Screenings:    12/17/2023    2:14 PM 04/15/2023    9:40 AM  PHQ 2/9 Scores  PHQ - 2 Score 5 0  PHQ- 9 Score 10       Data saved with a previous flowsheet row definition      12/17/2023    2:14 PM  GAD 7 : Generalized Anxiety  Score  Nervous, Anxious, on Edge 3   Control/stop worrying 0   Worry too much - different things 0   Trouble relaxing 3   Restless 0   Easily annoyed or irritable 3   Afraid - awful might happen 0   Total GAD 7 Score 9  Anxiety Difficulty Not difficult at all     Data saved with a previous flowsheet row definition   Most Recent Cognitive Screening:    04/15/2023    9:44 AM  6CIT Screen  What Year? 0 points  What month? 0 points  What time? 0 points  Count back from 20 0 points  Months in reverse 0 points  Repeat phrase 0 points  Total Score 0 points   Most Recent Vision/Hearing Screenings:No results found. Results:  Studies Obtained And Personally Reviewed By Me: Diabetic Foot Exam - Simple   No data filed     {Imaging, colonoscopy, mammogram, bone density scan, echocardiogram, heart cath, stress test, CT calcium score, etc.:32292}  Labs:  CBC w/ Differential Lab Results  Component Value Date   WBC 6.8 05/05/2023   RBC 4.62 05/05/2023   HGB 14.0 05/05/2023   HCT 41.7 05/05/2023   PLT 343 05/05/2023   MCV 90.3 05/05/2023   MCH 30.3 05/05/2023   MCHC 33.6 05/05/2023   RDW 13.2 05/05/2023   MPV 8.2 05/05/2023   LYMPHSABS 2,644 02/28/2022   MONOABS 0.9 01/27/2019   BASOSABS 48 05/05/2023    Comprehensive Metabolic Panel Lab Results  Component Value Date   NA 135 05/05/2023   K 4.2 05/05/2023   CL 96 (L) 05/05/2023   CO2 28 05/05/2023   GLUCOSE 81 05/05/2023   BUN 15 05/05/2023   CREATININE 0.67 05/05/2023   CALCIUM 9.5 05/05/2023   PROT 6.5 05/05/2023   ALBUMIN 4.4 01/27/2019   AST 15 05/05/2023   ALT 13 05/05/2023   ALKPHOS 82 01/27/2019   BILITOT 0.8 05/05/2023   EGFR 89 05/05/2023   GFRNONAA 74 01/17/2020   Lipid Panel  Lab Results  Component Value Date   CHOL 177 05/05/2023   HDL 69 05/05/2023   LDLCALC 87 05/05/2023   TRIG 110 05/05/2023   A1c No results found for: HGBA1C  TSH Lab Results  Component Value Date   TSH 0.23 (L)  05/05/2023   PSA{PSA (Optional):32132} No results found for any visits on 04/19/24. Assessment & Plan:  No orders of the defined types were placed in this encounter.  No orders of the defined types were  placed in this encounter.  Other Labs Reviewed today:    No follow-ups on file.   Annual Wellness Visit done today including the all of the following: Reviewed patient's Family Medical History Reviewed patient's SDOH and reviewed tobacco, alcohol, and drug use.  Reviewed and updated list of patient's medical providers Assessment of cognitive impairment was done Assessed patient's functional ability Established a written schedule for health screening services Health Risk Assessent Completed and Reviewed  Discussed health benefits of physical activity, and encouraged her to engage in regular exercise appropriate for her age and condition.    I,Makayla C Reid,acting as a scribe for Sherry JINNY Hailstone, MD.,have documented all relevant documentation on the behalf of Sherry JINNY Hailstone, MD,as directed by  Sherry JINNY Hailstone, MD while in the presence of Sherry JINNY Hailstone, MD.  I, Sherry JINNY Hailstone, MD, have reviewed all documentation for and agree with the above Annual Wellness Visit documentation.  Sherry JINNY Hailstone, MD Internal Medicine 04/19/2024    [1]  Allergies Allergen Reactions   Betadine [Povidone Iodine] Rash and Other (See Comments)    blisters   Penicillins Anaphylaxis   Leflunomide Diarrhea   Levofloxacin  Other (See Comments)    Muscle weakness, couldn't move well   Doxycycline  Rash    Rash on arms and palms    Golimumab  Rash  [2]  Social History Tobacco Use   Smoking status: Never   Smokeless tobacco: Never  Vaping Use   Vaping status: Never Used  Substance Use Topics   Alcohol use: Yes    Comment: socially   Drug use: No   "

## 2024-04-12 ENCOUNTER — Other Ambulatory Visit: Payer: Medicare Other

## 2024-04-13 ENCOUNTER — Encounter: Payer: Self-pay | Admitting: Physical Therapy

## 2024-04-13 ENCOUNTER — Ambulatory Visit: Admitting: Physical Therapy

## 2024-04-13 DIAGNOSIS — M6281 Muscle weakness (generalized): Secondary | ICD-10-CM

## 2024-04-13 DIAGNOSIS — R262 Difficulty in walking, not elsewhere classified: Secondary | ICD-10-CM

## 2024-04-13 NOTE — Therapy (Signed)
 " OUTPATIENT PHYSICAL THERAPY LOWER EXTREMITY TREATMENT   Patient Name: [name] MRN: [id number] DOB:01/16/1946, 79 y.o., female Today's Date: 04/13/2024  END OF SESSION:  PT End of Session - 04/13/24 1139     Visit Number 8    Number of Visits 16    Date for Recertification  04/22/24    Authorization Type Medicare    Authorization - Visit Number 7    Progress Note Due on Visit 10    PT Start Time 1050    PT Stop Time 1135    PT Time Calculation (min) 45 min    Activity Tolerance Patient tolerated treatment well    Behavior During Therapy WFL for tasks assessed/performed               Past Medical History:  Diagnosis Date   Allergic bronchitis    Allergic bronchitis    Allergy     Arthritis    Claustrophobia    Dependent edema    GERD (gastroesophageal reflux disease)    occas problem - burning, cough would take prilosec if needed   H/O hiatal hernia    HSV-2 (herpes simplex virus 2) infection    Hypothyroidism    Peripheral neuropathy    3 toes right foot 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 problem   Psoriatic arthritis (HCC) 02/18/2019   Thyroid  disease    Past Surgical History:  Procedure Laterality Date   BACK SURGERY     BREAST BIOPSY  1972   benign   CARPAL TUNNEL RELEASE Left 12/17/2018   Procedure: LEFT CARPAL TUNNEL RELEASE;  Surgeon: Murrell Drivers, MD;  Location:  [facility];  Service: Orthopedics;  Laterality: Left;   CHOLECYSTECTOMY     LUMBAR LAMINECTOMY  1999   L4   LUMBAR LAMINECTOMY  1981   L5   TOTAL HIP ARTHROPLASTY Right 07/02/2012   Procedure: RIGHT TOTAL HIP ARTHROPLASTY ANTERIOR APPROACH;  Surgeon: Lonni CINDERELLA Poli, MD;  Location: [facility];  Service: Orthopedics;  Laterality: Right;   Patient Active Problem List   Diagnosis Date Noted   Psoriatic arthritis (HCC) 03/14/2019   Chronic venous insufficiency 11/25/2018   Dependent edema 09/04/2018   Asymptomatic postmenopausal estrogen deficiency 01/03/2018   History  of hip replacement, total, unspecified laterality 06/22/2017   Bilateral sensorineural hearing loss 01/09/2016   Bilateral tinnitus 01/09/2016   Age-related nuclear cataract of both eyes 09/21/2015   Choroidal nevus, left eye 09/21/2015   Chronic dryness of both eyes 09/21/2015   Dermatochalasis of eyelid 09/21/2015   Presbyopia of both eyes 09/21/2015   PVD (posterior vitreous detachment), both eyes 09/21/2015   Regular astigmatism of both eyes 09/21/2015   Lateral cystocele 07/23/2015   Rectocele 07/23/2015   Osteoarthritis of right hip 05/24/2012   GE reflux 09/23/2011   Hypothyroidism 01/07/2011   Anxiety 01/07/2011   Allergic rhinitis 01/07/2011   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    PCP: Perri Ronal PARAS  REFERRING PROVIDER: Monnie Cedars  REFERRING DIAG: inflammatory arthritis  THERAPY DIAG:  Difficulty in walking, not elsewhere classified  Muscle weakness (generalized)  Rationale for Evaluation and Treatment: Rehabilitation  ONSET DATE: 02/2024 (MD appt)  SUBJECTIVE:   SUBJECTIVE STATEMENT: Pt states she feels really good. 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 She states that her inflammatory markers are the best they have been in 4 years. She states her  MD recommended that she do PT at this time because she is less stiff and swollen and is now able to exercise.  She states she has tightness in her hands and is now better able to close them. She reports muscle aches in her legs and occasional pain in her ankles. Mostly she feels squeezing and tightness. She states pain decreases with use of voltaran gel and with changing positions. Pt is usually walks 2 miles every day but she states this is getting more difficult due to muscle aches  PERTINENT HISTORY: Rt THA, lumbar laminectomy, carpal tunnel release, OA, RA PAIN:  Are you having pain? Yes:  NPRS scale: 2/10 ankles Pain location: ankles Pain description: zing, sore, tight Aggravating factors: standing and walking Relieving factors: voltaran  PRECAUTIONS: None  RED FLAGS: None   WEIGHT BEARING RESTRICTIONS: No  FALLS:  Has patient fallen in last 6 months? No   OCCUPATION: retired [employer], walks daily, pickleball  PLOF: Independent  PATIENT GOALS: be able to walk 2 miles and play pickleball without pain  NEXT MD VISIT: 05/2024  OBJECTIVE:  Note: Objective measures were completed at Evaluation unless otherwise noted.  DIAGNOSTIC FINDINGS: none on file  PATIENT SURVEYS:  PSFS: THE PATIENT SPECIFIC FUNCTIONAL SCALE  Place score of 0-10 (0 = unable to perform activity and 10 = able to perform activity at the same level as before injury or problem)  Activity Date: 02/26/24 03/23/24   Walk 2 miles 3 7   2.     3.     4.      Total Score 3 7     Total Score = Sum of activity scores/number of activities  Minimally Detectable Change: 3 points (for single activity); 2 points (for average score)  [facility] (nd). The Patient Specific Functional Scale . Retrieved from [email or web address]   COGNITION: Overall cognitive status: Within functional limits for tasks assessed      PALPATION: Circumferential tenderness Rt ankle  LOWER EXTREMITY ROM: Unable to flex Rt 2nd toe, decreased toe flexion Rt foot  Active ROM Right eval Left eval Rt/Lt 03/23/24  Hip flexion     Hip extension     Hip abduction     Hip adduction     Hip internal rotation     Hip external rotation     Knee flexion     Knee extension     Ankle dorsiflexion -3 3 1/2  Ankle plantarflexion 50 62 5160  Ankle inversion 16 20 20/20  Ankle eversion 6 8 18/15   (Blank rows = not tested)  LOWER EXTREMITY MMT:  MMT Right eval Left eval  Hip flexion 4+ 4-  Hip extension    Hip abduction    Hip adduction    Hip internal  rotation    Hip external rotation    Knee flexion 4 4  Knee extension 4 4  Ankle dorsiflexion 3 4  Ankle plantarflexion 4- 4-  Ankle inversion 4- 3  Ankle eversion 3 4-   (Blank rows = not tested)    FUNCTIONAL TESTS:  5 times sit to stand: 9.98 seconds SL heel raise = 4 bilat SLS: Lt = 8.68, Rt 4.64   [facility] Adult PT Treatment:  DATE: 04/13/24 Therapeutic Exercise: Seated rocker board for DF/PF stretch Leg press DL 04# x 15, SL 54# x 15 bilat Ankle circles after treatment  Neuromuscular re-ed: BAPS board CW/CCW - Rt more difficult than Lt SLS Rt 4-5 seconds, Lt x 30 seconds Tandem stance on foam  x 30 sec bilat with intermittent UE assist Standing on black side of Bosu: 1 UE KB swings with intermittent UE support Therapeutic Activity: Slow heel raise 6'' step with hold at end range for calf stretch Forward step down 6'' step for DF stretch and LE strengthening   [facility] Adult PT Treatment:                                                DATE: 04/01/24 Therapeutic Exercise: Seated ankle circles bilat after mobs Seated rocker board for DF/PF stretch Leg press DL 09# x 15, SL 54# x 15 bilat Manual Therapy: TC and mid foot mobs to improve Rt ankle mobility Neuromuscular re-ed: Tandem stance on foam 2 x 30 sec bilat SLS on foam 2 x 30 sec bilat --> SLS on ground 2 x 30 sec bilat Therapeutic Activity: Heel raise with ball between heels 2 x 10 Side step yellow TB around feet Lateral step down 6'' step for DF stretch and LE strengthening   [facility] Adult PT Treatment:                                                DATE: 03/30/24 Therapeutic Exercise: Peroneal nerve stretch supine with strap Seated ankle circles bilat after mobs Leg press DL 09# x 15, SL 54# x 10 bilat Manual Therapy: TC and mid foot mobs to improve Lt ankle mobility Neuromuscular re-ed: BAPS board seated L2 - difficult! Requires assistance Standing rocker board A/P  and lateral for balance and stretch Step ups on blue Bosu 2 x 10 bilat with UE support SLS 2 x 20 sec bilat with intermittent UE support Tandem stance with head turns - intermittent UE support   [facility] Adult PT Treatment:                                                DATE: 03/23/24 Therapeutic Exercise: Standing gastroc and soleus stretches 2 x 30 sec Peroneal nerve stretch supine with strap ROM assessment (see above)  Neuromuscular re-ed: Standing blue TB pull down with slow march for balance and core strength Tandem stance on airex with green TB Row x 15 bilat (CGA) Side step yellow TB around feet Step ups on blue Bosu x 10 bilat with UE support SLS on ground x 20 sec bilat - intermittent UE support Tandem stance on ground with head turns   [facility] Adult PT Treatment:                                                DATE: 03/18/2024 Therapeutic Exercise: Standing gastroc and soleus stretches 2 x 30 sec Peroneal nerve stretch supine with  strap  Neuromuscular re-ed: Heel walking/toe walking fwd/bkwd Side step yellow TB around feet Sidelying Clam shell red TB 2 x 10 Hip over and back 2 x 10 Standing blue TB pull down with slow march for balance and core strength Therapeutic Activity: Side step on airex beam Tandem walking on airex beam    PATIENT EDUCATION:  Education details: Updated HEP Person educated: Patient Education method: Explanation, Demonstration, and Handouts Education comprehension: verbalized understanding and returned demonstration  HOME EXERCISE PROGRAM: Access Code: [email or web address] URL: [email or web address] Date: 03/23/2024 Prepared by: Darice Conine  Exercises - Gastroc Stretch on Wall  - 1 x daily - 7 x weekly - 1 sets - 3 reps - 20-30 seconds hold - Soleus Stretch on Wall  - 1 x daily - 7 x weekly - 1 sets - 3 reps - 20-30 seconds hold - Ankle Eversion with Resistance  - 1 x daily - 7 x weekly - 3 sets - 10 reps - Ankle Inversion with  Resistance  - 1 x daily - 7 x weekly - 3 sets - 10 reps - Clamshell at Wall with Resistance  - 1 x daily - 7 x weekly - 1-3 sets - 10 reps - 3 sec hold - Sidelying Over and Back  - 1 x daily - 7 x weekly - 1-3 sets - 10 reps - Seated Self Great Toe Stretch  - 1 x daily - 7 x weekly - 1 sets - 5 reps - 10 sec hold - Supine Peroneal Nerve Glide  - 1 x daily - 7 x weekly - 1 sets - 10 reps - Side Stepping with Resistance at Feet  - 1 x daily - 7 x weekly - 3 sets - 10 reps - Tandem Stance with Head Rotation  - 1 x daily - 7 x weekly - 3 sets - 10 reps  ASSESSMENT:  CLINICAL IMPRESSION: Continued to progress strength and balance. Pt with some increased pain with standing on compliant surfaces but with better tolerance to firm surface of Bosu. Pt continues to be challenged by strengthening at leg press and stairs  EVAL: Patient is a 79 y.o. female who was seen today for physical therapy evaluation and treatment for inflammatory arthritis. She presents with decreased LE strength, decreased ankle strength and mobility, decreased balance and decreased functional activity tolerance. She will benefit from skilled PT to address deficits and improve functional ROM.   OBJECTIVE IMPAIRMENTS: decreased activity tolerance, difficulty walking, decreased ROM, decreased strength, and pain.     GOALS: Goals reviewed with patient? Yes  SHORT TERM GOALS: Target date: 03/25/2024  Pt will be independent in initial HEP Baseline: Goal status: MET  2.  Pt will improve Rt ankle ROM by 5 degrees in all directions to improve walking tolerance Baseline:  Goal status: IN PROGRESS   LONG TERM GOALS: Target date: 04/22/2024  Pt will be independent with advanced HEP Baseline:  Goal status: INITIAL  2.  Pt will improve LE strength to 4+/5 to be able to walk 2 miles with decreased pain Baseline:  Goal status: INITIAL  3.  Pt will demo improved balance by performing SLS >= 10 seconds bilat Baseline:  Goal status:  IN PROGRESS  4.  Pt will improve PSFS to >= 6 to demo improved functional mobility Baseline:  Goal status: INITIAL  5.  Pt will demo improved functional strength by improving 5 x STS to <= 8 seconds Baseline:  Goal status: INITIAL   PLAN:  PT FREQUENCY: 2x/week  PT DURATION: 8 weeks  PLANNED INTERVENTIONS: 97164- PT Re-evaluation, 97110-Therapeutic exercises, 97530- Therapeutic activity, [id number]- Neuromuscular re-education, 97535- Self Care, [id number]- Manual therapy, [phone]- Gait training, [phone]- Aquatic Therapy, [phone]- Electrical stimulation (unattended), 97016- Vasopneumatic device, [id number]- Ionotophoresis 4mg /ml Dexamethasone , 79439 (1-2 muscles), 20561 (3+ muscles)- Dry Needling, Patient/Family education, Balance training, Taping, Cryotherapy, and Moist heat  PLAN FOR NEXT SESSION: Progress ankle & glute strength and mobility, static/dynamic balance, great toe extension/mobility, core strength. Update HEP as needed.   Sanjana Folz, PT 04/13/2024, 11:40 AM  "

## 2024-04-15 ENCOUNTER — Other Ambulatory Visit

## 2024-04-15 ENCOUNTER — Encounter: Payer: Self-pay | Admitting: Physical Therapy

## 2024-04-15 ENCOUNTER — Ambulatory Visit: Admitting: Physical Therapy

## 2024-04-15 DIAGNOSIS — E039 Hypothyroidism, unspecified: Secondary | ICD-10-CM

## 2024-04-15 DIAGNOSIS — E785 Hyperlipidemia, unspecified: Secondary | ICD-10-CM

## 2024-04-15 DIAGNOSIS — M6281 Muscle weakness (generalized): Secondary | ICD-10-CM

## 2024-04-15 DIAGNOSIS — R262 Difficulty in walking, not elsewhere classified: Secondary | ICD-10-CM

## 2024-04-15 DIAGNOSIS — E7800 Pure hypercholesterolemia, unspecified: Secondary | ICD-10-CM

## 2024-04-15 DIAGNOSIS — Z0000 Encounter for general adult medical examination without abnormal findings: Secondary | ICD-10-CM

## 2024-04-16 ENCOUNTER — Ambulatory Visit: Payer: Self-pay | Admitting: Internal Medicine

## 2024-04-16 LAB — CBC WITH DIFFERENTIAL/PLATELET
Absolute Lymphocytes: 1909 {cells}/uL (ref 850–3900)
Absolute Monocytes: 662 {cells}/uL (ref 200–950)
Basophils Absolute: 50 {cells}/uL (ref 0–200)
Basophils Relative: 0.8 %
Eosinophils Absolute: 321 {cells}/uL (ref 15–500)
Eosinophils Relative: 5.1 %
HCT: 41.8 % (ref 35.9–46.0)
Hemoglobin: 14 g/dL (ref 11.7–15.5)
MCH: 31 pg (ref 27.0–33.0)
MCHC: 33.5 g/dL (ref 31.6–35.4)
MCV: 92.5 fL (ref 81.4–101.7)
MPV: 8.9 fL (ref 7.5–12.5)
Monocytes Relative: 10.5 %
Neutro Abs: 3358 {cells}/uL (ref 1500–7800)
Neutrophils Relative %: 53.3 %
Platelets: 310 10*3/uL (ref 140–400)
RBC: 4.52 Million/uL (ref 3.80–5.10)
RDW: 13.1 % (ref 11.0–15.0)
Total Lymphocyte: 30.3 %
WBC: 6.3 10*3/uL (ref 3.8–10.8)

## 2024-04-16 LAB — LIPID PANEL
Cholesterol: 202 mg/dL — ABNORMAL HIGH
HDL: 91 mg/dL
LDL Cholesterol (Calc): 94 mg/dL
Non-HDL Cholesterol (Calc): 111 mg/dL
Total CHOL/HDL Ratio: 2.2 (calc)
Triglycerides: 81 mg/dL

## 2024-04-16 LAB — COMPREHENSIVE METABOLIC PANEL WITH GFR
AG Ratio: 1.8 (calc) (ref 1.0–2.5)
ALT: 15 U/L (ref 6–29)
AST: 15 U/L (ref 10–35)
Albumin: 4.1 g/dL (ref 3.6–5.1)
Alkaline phosphatase (APISO): 67 U/L (ref 37–153)
BUN: 20 mg/dL (ref 7–25)
CO2: 30 mmol/L (ref 20–32)
Calcium: 9.3 mg/dL (ref 8.6–10.4)
Chloride: 104 mmol/L (ref 98–110)
Creat: 0.65 mg/dL (ref 0.60–0.95)
Globulin: 2.3 g/dL (ref 1.9–3.7)
Glucose, Bld: 74 mg/dL (ref 65–99)
Potassium: 4.3 mmol/L (ref 3.5–5.3)
Sodium: 141 mmol/L (ref 135–146)
Total Bilirubin: 0.7 mg/dL (ref 0.2–1.2)
Total Protein: 6.4 g/dL (ref 6.1–8.1)
eGFR: 89 mL/min/{1.73_m2}

## 2024-04-16 LAB — TSH: TSH: 0.24 m[IU]/L — ABNORMAL LOW (ref 0.40–4.50)

## 2024-04-19 ENCOUNTER — Ambulatory Visit: Payer: Medicare Other | Admitting: Internal Medicine

## 2024-04-19 ENCOUNTER — Ambulatory Visit: Admitting: Internal Medicine

## 2024-04-19 ENCOUNTER — Encounter: Payer: Self-pay | Admitting: Internal Medicine

## 2024-04-19 VITALS — BP 130/80 | HR 74 | Ht 62.75 in | Wt 145.0 lb

## 2024-04-19 DIAGNOSIS — Z0000 Encounter for general adult medical examination without abnormal findings: Secondary | ICD-10-CM

## 2024-04-19 MED ORDER — ALPRAZOLAM 0.5 MG PO TABS: ORAL_TABLET | ORAL | 2 refills | Status: AC

## 2024-04-19 NOTE — Progress Notes (Unsigned)
 "  Chief Complaint  Patient presents with   Annual Exam   Medicare Wellness     Subjective:   [name] is a 79 y.o. female who presents for a Medicare Annual Wellness Visit.  Visit info / Clinical Intake: Medicare Wellness Visit Type:: Subsequent Annual Wellness Visit Persons participating in visit and providing information:: patient Medicare Wellness Visit Mode:: In-person (required for WTM) Interpreter Needed?: No Pre-visit prep was completed: yes AWV questionnaire completed by patient prior to visit?: no Living arrangements:: lives with spouse/significant other Patient's Overall Health Status Rating: good Typical amount of pain: some Does pain affect daily life?: no Are you currently prescribed opioids?: no  Dietary Habits and Nutritional Risks How many meals a day?: 3 Eats fruit and vegetables daily?: yes Most meals are obtained by: preparing own meals In the last 2 weeks, have you had any of the following?: none Diabetic:: no  Functional Status Activities of Daily Living (to include ambulation/medication): Independent Ambulation: Independent Medication Administration: Independent Home Management (perform basic housework or laundry): Independent Manage your own finances?: yes Primary transportation is: driving Concerns about vision?: no *vision screening is required for WTM* Concerns about hearing?: (!) yes Uses hearing aids?: (!) yes Hear whispered voice?: (!) no *in-person visit only*  Fall Screening Falls in the past year?: 0 Number of falls in past year: 0 Was there an injury with Fall?: 0 Fall Risk Category Calculator: 0 Patient Fall Risk Level: Low Fall Risk  Fall Risk Patient at Risk for Falls Due to: No Fall Risks Fall risk Follow up: Falls prevention discussed; Education provided; Falls evaluation completed  Home and Transportation Safety: All rugs have non-skid backing?: yes All stairs or steps have railings?: yes Grab bars in the bathtub or  shower?: yes Have non-skid surface in bathtub or shower?: yes Good home lighting?: yes Regular seat belt use?: yes Hospital stays in the last year:: no  Cognitive Assessment Difficulty concentrating, remembering, or making decisions? : no Will 6CIT or Mini Cog be Completed: yes What year is it?: 0 points What month is it?: 0 points About what time is it?: 0 points Count backwards from 20 to 1: 0 points Say the months of the year in reverse: 0 points Repeat the address phrase from earlier: 0 points 6 CIT Score: 0 points  Advance Directives (For Healthcare) Does Patient Have a Medical Advance Directive?: Yes Does patient want to make changes to medical advance directive?: No - Patient declined Type of Advance Directive: Living will; Healthcare Power of Attorney Copy of Healthcare Power of Attorney in Chart?: No - copy requested Copy of Living Will in Chart?: No - copy requested  Reviewed/Updated  Reviewed/Updated: Reviewed All (Medical, Surgical, Family, Medications, Allergies, Care Teams, Patient Goals)    Allergies (verified) Betadine [povidone iodine], Penicillins, Leflunomide, Levofloxacin , Doxycycline , and Golimumab    Current Medications (verified) Outpatient Encounter Medications as of 04/19/2024  Medication Sig   buPROPion  (WELLBUTRIN  XL) 150 MG 24 hr tablet TAKE 1 TABLET (150 MG TOTAL) BY MOUTH DAILY.   EPINEPHrine  0.3 mg/0.3 mL IJ SOAJ injection    glucosamine-chondroitin 500-400 MG tablet Take 1 tablet by mouth 2 (two) times daily.   Magnesium 400 MG CAPS Take by mouth.   Multiple Vitamin (MULTIVITAMIN) tablet Take 1 tablet by mouth daily.   Multiple Vitamins-Minerals (PRESERVISION AREDS 2 PO) Take by mouth.   Omega-3 Fatty Acids (OMEGA 3 500 PO) Take by mouth.   OVER THE COUNTER MEDICATION Vitamin D  3  One daily  potassium chloride  SA (KLOR-CON  M) 20 MEQ tablet TAKE 1 TABLET BY MOUTH EVERY DAY   rOPINIRole  (REQUIP ) 0.5 MG tablet TAKE 1 TABLET BY MOUTH 3 TIMES DAILY    SYNTHROID  125 MCG tablet Take 125 mcg by mouth daily before breakfast.   torsemide  (DEMADEX ) 20 MG tablet TAKE 1 TABLET BY MOUTH EVERY DAY   [DISCONTINUED] ALPRAZolam  (XANAX ) 0.5 MG tablet TAKE 1 TABLET BY MOUTH EACH NIGHT AT BEDTIME AS NEEDED SLEEP   [DISCONTINUED] benzonatate  (TESSALON ) 100 MG capsule Take 1 capsule (100 mg total) by mouth 2 (two) times daily as needed.   ALPRAZolam  (XANAX ) 0.5 MG tablet TAKE 1 TABLET BY MOUTH EACH NIGHT AT BEDTIME AS NEEDED SLEEP   [DISCONTINUED] HUMIRA, 2 PEN, 40 MG/0.4ML pen Inject 40 mg into the skin every 14 (fourteen) days.   Facility-Administered Encounter Medications as of 04/19/2024  Medication   clotrimazole -betamethasone  (LOTRISONE ) cream    History: Past Medical History:  Diagnosis Date   Allergic bronchitis    Allergic bronchitis    Allergy     Arthritis    Claustrophobia    Dependent edema    GERD (gastroesophageal reflux disease)    occas problem - burning, cough would take prilosec if needed   H/O hiatal hernia    HSV-2 (herpes simplex virus 2) infection    Hypothyroidism    Peripheral neuropathy    3 toes right foot 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 problem   Psoriatic arthritis (HCC) 02/18/2019   Thyroid  disease    Past Surgical History:  Procedure Laterality Date   BACK SURGERY     BREAST BIOPSY  1972   benign   CARPAL TUNNEL RELEASE Left 12/17/2018   Procedure: LEFT CARPAL TUNNEL RELEASE;  Surgeon: Murrell Drivers, MD;  Location: [facility];  Service: Orthopedics;  Laterality: Left;   CHOLECYSTECTOMY     LUMBAR LAMINECTOMY  1999   L4   LUMBAR LAMINECTOMY  1981   L5   TOTAL HIP ARTHROPLASTY Right 07/02/2012   Procedure: RIGHT TOTAL HIP ARTHROPLASTY ANTERIOR APPROACH;  Surgeon: Lonni CINDERELLA Poli, MD;  Location: [facility];  Service: Orthopedics;  Laterality: Right;   Family History  Problem Relation Age of Onset   Heart disease Father    Hypertension Sister    Heart disease Brother    Asthma Daughter     Hypertension Sister    Social History   Occupational History   Not on file  Tobacco Use   Smoking status: Never   Smokeless tobacco: Never  Vaping Use   Vaping status: Never Used  Substance and Sexual Activity   Alcohol use: Yes    Comment: socially   Drug use: No   Sexual activity: Not on file   Tobacco Counseling Counseling given: No  SDOH Screenings   Food Insecurity: No Food Insecurity (04/19/2024)  Housing: Low Risk (04/19/2024)  Transportation Needs: No Transportation Needs (04/19/2024)  Utilities: Not At Risk (04/19/2024)  Alcohol Screen: Low Risk (01/29/2024)  Depression (PHQ2-9): Low Risk (04/19/2024)  Financial Resource Strain: Low Risk (04/19/2024)  Physical Activity: Sufficiently Active (04/19/2024)  Social Connections: Socially Integrated (04/19/2024)  Stress: No Stress Concern Present (04/19/2024)  Tobacco Use: Low Risk (04/19/2024)  Health Literacy: Adequate Health Literacy (04/19/2024)   See flowsheets for full screening details  Depression Screen PHQ 2 & 9 Depression Scale- Over the past 2 weeks, how often have you been bothered by any of the following problems? Little interest or pleasure in doing things: 0 Feeling down, depressed, or  hopeless (PHQ Adolescent also includes...irritable): 0 PHQ-2 Total Score: 0 Trouble falling or staying asleep, or sleeping too much: 0 Feeling tired or having little energy: 0 Poor appetite or overeating (PHQ Adolescent also includes...weight loss): 0 Feeling bad about yourself - or that you are a failure or have let yourself or your family down: 0 Trouble concentrating on things, such as reading the newspaper or watching television (PHQ Adolescent also includes...like school work): 0 Moving or speaking so slowly that other people could have noticed. Or the opposite - being so fidgety or restless that you have been moving around a lot more than usual: 0 Thoughts that you would be better off dead, or of hurting yourself in some way: 0 PHQ-9  Total Score: 0 If you checked off any problems, how difficult have these problems made it for you to do your work, take care of things at home, or get along with other people?: Not difficult at all  Depression Treatment Depression Interventions/Treatment : Currently on Treatment     Goals Addressed   None          Objective:    Today's Vitals   04/19/24 1407  BP: 130/80  Pulse: 74  SpO2: 97%  Weight: 145 lb (65.8 kg)  Height: 5' 2.75 (1.594 m)  PainSc: 0-No pain   Body mass index is 25.89 kg/m.  Hearing/Vision screen Vision Screening - Comments:: [facility] Immunizations and Health Maintenance Health Maintenance  Topic Date Due   Zoster Vaccines- Shingrix (1 of 2) Never done   COVID-19 Vaccine (7 - 2025-26 season) 11/16/2023   Mammogram  06/23/2024   Medicare Annual Wellness (AWV)  04/19/2025   DTaP/Tdap/Td (3 - Td or Tdap) 01/02/2028   Pneumococcal Vaccine: 50+ Years  Completed   Influenza Vaccine  Completed   Bone Density Scan  Completed   Meningococcal B Vaccine  Aged Out   Colonoscopy  Discontinued        Assessment/Plan:  This is a routine wellness examination for [name].  Patient Care Team: Perri Ronal PARAS, MD as PCP - General (Internal Medicine) Lonni Slain, MD as PCP - Cardiology (Cardiology)  I have personally reviewed and noted the following in the patients chart:   Medical and social history Use of alcohol, tobacco or illicit drugs  Current medications and supplements including opioid prescriptions. Functional ability and status Nutritional status Physical activity Advanced directives List of other physicians Hospitalizations, surgeries, and ER visits in previous 12 months Vitals Screenings to include cognitive, depression, and falls Referrals and appointments  Orders Placed This Encounter  Procedures   POCT URINALYSIS DIP (CLINITEK)   In addition, I have reviewed and discussed with patient certain preventive  protocols, quality metrics, and best practice recommendations. A written personalized care plan for preventive services as well as general preventive health recommendations were provided to patient.   Zaylan Kissoon, CMA   04/19/2024   Return in about 1 year (around 04/19/2025).  After Visit Summary: (In Person-Printed) AVS printed and given to the patient  Nurse Notes: none  "

## 2024-04-20 ENCOUNTER — Ambulatory Visit: Admitting: Physical Therapy

## 2024-04-20 DIAGNOSIS — R262 Difficulty in walking, not elsewhere classified: Secondary | ICD-10-CM

## 2024-04-20 DIAGNOSIS — M6281 Muscle weakness (generalized): Secondary | ICD-10-CM

## 2024-04-20 NOTE — Therapy (Cosign Needed)
 " OUTPATIENT PHYSICAL THERAPY LOWER EXTREMITY TREATMENT Progress Note Reporting Period 02/25/2025 to 04/20/2024  See note below for Objective Data and Assessment of Progress/Goals.      Patient Name: [name] MRN: [id number] DOB:1945-09-07, 79 y.o., female Today's Date: 04/20/2024  END OF SESSION:  PT End of Session - 04/20/24 1401     Visit Number 10    Number of Visits 16    Date for Recertification  04/22/24    Authorization Type Medicare    Progress Note Due on Visit 10    PT Start Time 1400    PT Stop Time 1447    PT Time Calculation (min) 47 min    Activity Tolerance Patient tolerated treatment well    Behavior During Therapy WFL for tasks assessed/performed         Past Medical History:  Diagnosis Date   Allergic bronchitis    Allergic bronchitis    Allergy     Arthritis    Claustrophobia    Dependent edema    GERD (gastroesophageal reflux disease)    occas problem - burning, cough would take prilosec if needed   H/O hiatal hernia    HSV-2 (herpes simplex virus 2) infection    Hypothyroidism    Peripheral neuropathy    3 toes right foot 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 problem   Psoriatic arthritis (HCC) 02/18/2019   Thyroid  disease    Past Surgical History:  Procedure Laterality Date   BACK SURGERY     BREAST BIOPSY  1972   benign   CARPAL TUNNEL RELEASE Left 12/17/2018   Procedure: LEFT CARPAL TUNNEL RELEASE;  Surgeon: Murrell Drivers, MD;  Location: [facility];  Service: Orthopedics;  Laterality: Left;   CHOLECYSTECTOMY     LUMBAR LAMINECTOMY  1999   L4   LUMBAR LAMINECTOMY  1981   L5   TOTAL HIP ARTHROPLASTY Right 07/02/2012   Procedure: RIGHT TOTAL HIP ARTHROPLASTY ANTERIOR APPROACH;  Surgeon: Lonni CINDERELLA Poli, MD;  Location: [facility];  Service: Orthopedics;  Laterality: Right;   Patient Active Problem List   Diagnosis Date Noted   Psoriatic arthritis (HCC) 03/14/2019   Chronic venous insufficiency 11/25/2018   Dependent edema  09/04/2018   Asymptomatic postmenopausal estrogen deficiency 01/03/2018   History of hip replacement, total, unspecified laterality 06/22/2017   Bilateral sensorineural hearing loss 01/09/2016   Bilateral tinnitus 01/09/2016   Age-related nuclear cataract of both eyes 09/21/2015   Choroidal nevus, left eye 09/21/2015   Chronic dryness of both eyes 09/21/2015   Dermatochalasis of eyelid 09/21/2015   Presbyopia of both eyes 09/21/2015   PVD (posterior vitreous detachment), both eyes 09/21/2015   Regular astigmatism of both eyes 09/21/2015   Lateral cystocele 07/23/2015   Rectocele 07/23/2015   Osteoarthritis of right hip 05/24/2012   GE reflux 09/23/2011   Hypothyroidism 01/07/2011   Anxiety 01/07/2011   Allergic rhinitis 01/07/2011   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 She states that her inflammatory markers are the best they have been in 4 years.  She states her MD recommended that she do PT at this time because she is less stiff and swollen and is now able to exercise.  She states she has tightness in her hands and is now better able to close them. She reports muscle aches in her legs and occasional pain in her ankles. Mostly she feels squeezing and tightness. She states pain decreases with use of voltaran gel and with changing positions. Pt is usually walks 2 miles every day but she states this is getting more difficult due to muscle aches  PERTINENT HISTORY: Rt THA, lumbar  laminectomy, carpal tunnel release, OA, RA PAIN:  Are you having pain? Yes: NPRS scale: 2/10 ankles Pain location: ankles Pain description: zing, sore, tight Aggravating factors: standing and walking Relieving factors: voltaran  PRECAUTIONS: None  RED FLAGS: None   WEIGHT BEARING RESTRICTIONS: No  FALLS:  Has patient fallen in last 6 months? No   OCCUPATION: retired [employer], walks daily, pickleball  PLOF: Independent  PATIENT GOALS: be able to walk 2 miles and play pickleball without pain  NEXT MD VISIT: 05/2024  OBJECTIVE:  Note: Objective measures were completed at Evaluation unless otherwise noted.  DIAGNOSTIC FINDINGS: none on file  PATIENT SURVEYS:  PSFS: THE PATIENT SPECIFIC FUNCTIONAL SCALE  Place score of 0-10 (0 = unable to perform activity and 10 = able to perform activity at the same level as before injury or problem)  Activity Date: 02/26/24 03/23/24 04/20/24  Walk 2 miles 3 7 7   2.     3.     4.      Total Score 3 7 7     Total Score = Sum of activity scores/number of activities  Minimally Detectable Change: 3 points (for single activity); 2 points (for average score)  [facility] (nd). The Patient Specific Functional Scale . Retrieved from [email or web address]   COGNITION: Overall cognitive status: Within functional limits for tasks assessed      PALPATION: Circumferential tenderness Rt ankle  LOWER EXTREMITY ROM: Unable to flex Rt 2nd toe, decreased toe flexion Rt foot  Active ROM Right eval Left eval Rt/Lt 03/23/24 Rt/Lt 04/20/24  Hip flexion      Hip extension      Hip abduction      Hip adduction      Hip internal rotation      Hip external rotation      Knee flexion      Knee extension      Ankle dorsiflexion -3 3 1/2 3/3  Ankle plantarflexion 50 62 5160 50/60  Ankle inversion 16 20 20/20 13/18  Ankle eversion 6 8 18/15 5/5   (Blank rows = not tested)  LOWER EXTREMITY  MMT:  MMT Right eval Left eval Rt/Lt 04/20/24  Hip flexion 4+ 4- 4+/4+  Hip extension     Hip abduction     Hip adduction     Hip internal rotation     Hip external rotation     Knee flexion 4 4 4/4+  Knee extension 4 4 4/4+  Ankle dorsiflexion 3 4 4+/4+  Ankle plantarflexion 4- 4- 4+/4+  Ankle inversion 4- 3 4/4  Ankle eversion 3 4- 4/4   (Blank rows = not tested)    FUNCTIONAL TESTS:  5 times sit to stand: 9.98 seconds SL heel raise = 4 bilat SLS: Lt = 8.68, Rt 4.64   [facility] Adult PT Treatment:  DATE: 04/20/2024 Therapeutic Exercise: Ankle measurements (see above) Ankle/knee/hip MMT (see above) Therapeutic Activity: Towel scrunches Arch lifting Toe raises Single leg stance Heel raises 3-3-3 tempo Sit to stand timed Updated LTGs Self Care: Toe spacer recommendation   [facility] Adult PT Treatment:                                                DATE: 04/15/24 Therapeutic Exercise: Peroneal nerve stretch supine with strap Ankle eversion x 10 yellow TB, x 10 no resistance  Neuromuscular re-ed: Ismoetric inversion with ball 10 x 3 seconds SL pallof step out red TB - difficult! Diver to foam pad on mat table -- difficult on Rt so used kickstand initially and progressed to SLS Therapeutic Activity: Slow Heel raise with ball between heels 2 x 10 Slow heel raise 6'' step with hold at end range for calf stretch Side step yellow TB   [facility] Adult PT Treatment:                                                DATE: 04/13/24 Therapeutic Exercise: Seated rocker board for DF/PF stretch Leg press DL 04# x 15, SL 54# x 15 bilat Ankle circles after treatment  Neuromuscular re-ed: BAPS board CW/CCW - Rt more difficult than Lt SLS Rt 4-5 seconds, Lt x 30 seconds Tandem stance on foam  x 30 sec bilat with intermittent UE assist Standing on black side of Bosu: 1 UE KB swings with intermittent UE support Therapeutic Activity: Slow heel  raise 6'' step with hold at end range for calf stretch Forward step down 6'' step for DF stretch and LE strengthening    PATIENT EDUCATION:  Education details: Updated HEP Person educated: Patient Education method: Explanation, Demonstration, and Handouts Education comprehension: verbalized understanding and returned demonstration  HOME EXERCISE PROGRAM: Access Code: [email or web address] URL: [email or web address] Date: 04/20/2024 Prepared by: Lamarr Price  Exercises - Gastroc Stretch on Wall  - 1 x daily - 7 x weekly - 1 sets - 3 reps - 20-30 seconds hold - Soleus Stretch on Wall  - 1 x daily - 7 x weekly - 1 sets - 3 reps - 20-30 seconds hold - Sidelying Over and Back  - 1 x daily - 7 x weekly - 1-3 sets - 10 reps - Seated Self Great Toe Stretch  - 1 x daily - 7 x weekly - 1 sets - 5 reps - 10 sec hold - Tandem Stance with Head Rotation  - 1 x daily - 7 x weekly - 3 sets - 10 reps - Wide Tandem Stance on Foam Pad with Eyes Open  - 1 x daily - 7 x weekly - 3 sets - 10 reps - Squat on Foam  - 1 x daily - 7 x weekly - 3 sets - 10 reps - Wide Stance with Head Rotation on Foam Pad  - 1 x daily - 7 x weekly - 3 sets - 10 reps - Towel Scrunches  - 1 x daily - 7 x weekly - 3 sets - 10 reps - Arch Lifting  - 1 x daily - 7 x weekly - 3 sets - 10 reps - Heel Raises with Counter Support  - 1  x daily - 7 x weekly - 3 sets - 10 reps - Heel Toe Raises with Counter Support  - 1 x daily - 7 x weekly - 3 sets - 10 reps - Seated Ankle Alphabet  - 1 x daily - 7 x weekly - 3 sets - 10 reps  ASSESSMENT:  CLINICAL IMPRESSION:  Patient has met 3/5 LTGs; continues to have pain in bilateral ankles with prolonged walking (approx 1/5 mile mark) and greater instability in Rt single leg stance. Patient is in agreement to discharge PT and continue exercise program independently.   OBJECTIVE IMPAIRMENTS: decreased activity tolerance, difficulty walking, decreased ROM, decreased strength, and pain.      GOALS: Goals reviewed with patient? Yes  SHORT TERM GOALS: Target date: 03/25/2024  Pt will be independent in initial HEP Baseline: Goal status: MET  2.  Pt will improve Rt ankle ROM by 5 degrees in all directions to improve walking tolerance Baseline:  Goal status: IN PROGRESS   LONG TERM GOALS: Target date: 04/22/2024  Pt will be independent with advanced HEP Baseline:  Goal status: MET  2.  Pt will improve LE strength to 4+/5 to be able to walk 2 miles with decreased pain Baseline:  04/20/24: see above Goal status: PARTIALLY MET  3.  Pt will demo improved balance by performing SLS >= 10 seconds bilat Baseline:  04/20/24: Lt 10 sec, Rt 4 sec  Goal status: PARTIALLY MET  4.  Pt will improve PSFS to >= 6 to demo improved functional mobility Baseline:  04/20/24: 7 Goal status: MET  5.  Pt will demo improved functional strength by improving 5 x STS to <= 8 seconds Baseline:  04/20/24: 7 sec  Goal status: MET   PLAN:  PT FREQUENCY: 2x/week  PT DURATION: 8 weeks  PLANNED INTERVENTIONS: 97164- PT Re-evaluation, 97110-Therapeutic exercises, 97530- Therapeutic activity, 97112- Neuromuscular re-education, 97535- Self Care, [id number]- Manual therapy, [phone]- Gait training, [phone]- Aquatic Therapy, [phone]- Electrical stimulation (unattended), 97016- Vasopneumatic device, [id number]- Ionotophoresis 4mg /ml Dexamethasone , 79439 (1-2 muscles), 20561 (3+ muscles)- Dry Needling, Patient/Family education, Balance training, Taping, Cryotherapy, and Moist heat  PLAN FOR NEXT SESSION: Discharge   Lamarr GORMAN Price, PTA 04/20/2024, 4:44 PM  "

## 2024-04-22 ENCOUNTER — Ambulatory Visit: Admitting: Physical Therapy

## 2025-04-24 ENCOUNTER — Other Ambulatory Visit

## 2025-05-01 ENCOUNTER — Ambulatory Visit: Admitting: Internal Medicine
